# Patient Record
Sex: Female | Born: 1950
Health system: Southern US, Community
[De-identification: ages and names within clinical notes are randomized; demographics above are authoritative.]

## PROBLEM LIST (undated history)

## (undated) DIAGNOSIS — N951 Menopausal and female climacteric states: Secondary | ICD-10-CM

## (undated) DIAGNOSIS — F32A Depression, unspecified: Secondary | ICD-10-CM

## (undated) DIAGNOSIS — I4891 Unspecified atrial fibrillation: Secondary | ICD-10-CM

## (undated) DIAGNOSIS — M8580 Other specified disorders of bone density and structure, unspecified site: Secondary | ICD-10-CM

## (undated) DIAGNOSIS — J45909 Unspecified asthma, uncomplicated: Secondary | ICD-10-CM

## (undated) DIAGNOSIS — R519 Headache, unspecified: Secondary | ICD-10-CM

## (undated) DIAGNOSIS — F419 Anxiety disorder, unspecified: Secondary | ICD-10-CM

## (undated) DIAGNOSIS — J40 Bronchitis, not specified as acute or chronic: Secondary | ICD-10-CM

## (undated) DIAGNOSIS — R51 Headache: Secondary | ICD-10-CM

## (undated) DIAGNOSIS — R011 Cardiac murmur, unspecified: Secondary | ICD-10-CM

## (undated) DIAGNOSIS — R42 Dizziness and giddiness: Secondary | ICD-10-CM

## (undated) DIAGNOSIS — F329 Major depressive disorder, single episode, unspecified: Secondary | ICD-10-CM

## (undated) DIAGNOSIS — M1990 Unspecified osteoarthritis, unspecified site: Secondary | ICD-10-CM

## (undated) HISTORY — DX: Unspecified asthma, uncomplicated: J45.909

## (undated) HISTORY — DX: Depression, unspecified: F32.A

## (undated) HISTORY — PX: NASAL SINUS SURGERY: SHX719

## (undated) HISTORY — DX: Bronchitis, not specified as acute or chronic: J40

## (undated) HISTORY — DX: Other specified disorders of bone density and structure, unspecified site: M85.80

## (undated) HISTORY — PX: BREAST BIOPSY: SHX20

## (undated) HISTORY — DX: Menopausal and female climacteric states: N95.1

## (undated) HISTORY — DX: Anxiety disorder, unspecified: F41.9

## (undated) HISTORY — DX: Major depressive disorder, single episode, unspecified: F32.9

---

## 1999-06-17 ENCOUNTER — Other Ambulatory Visit: Admission: RE | Admit: 1999-06-17 | Discharge: 1999-06-17 | Payer: Self-pay | Admitting: Obstetrics and Gynecology

## 1999-09-10 ENCOUNTER — Encounter: Payer: Self-pay | Admitting: Internal Medicine

## 1999-09-10 ENCOUNTER — Encounter: Admission: RE | Admit: 1999-09-10 | Discharge: 1999-09-10 | Payer: Self-pay | Admitting: Internal Medicine

## 2000-06-18 ENCOUNTER — Other Ambulatory Visit: Admission: RE | Admit: 2000-06-18 | Discharge: 2000-06-18 | Payer: Self-pay | Admitting: Obstetrics and Gynecology

## 2000-09-16 ENCOUNTER — Encounter: Payer: Self-pay | Admitting: Internal Medicine

## 2000-09-16 ENCOUNTER — Encounter: Admission: RE | Admit: 2000-09-16 | Discharge: 2000-09-16 | Payer: Self-pay | Admitting: Internal Medicine

## 2000-09-21 ENCOUNTER — Encounter: Payer: Self-pay | Admitting: Internal Medicine

## 2000-09-21 ENCOUNTER — Encounter: Admission: RE | Admit: 2000-09-21 | Discharge: 2000-09-21 | Payer: Self-pay | Admitting: Internal Medicine

## 2001-06-29 ENCOUNTER — Other Ambulatory Visit: Admission: RE | Admit: 2001-06-29 | Discharge: 2001-06-29 | Payer: Self-pay | Admitting: Obstetrics and Gynecology

## 2001-08-03 ENCOUNTER — Ambulatory Visit (HOSPITAL_COMMUNITY): Admission: RE | Admit: 2001-08-03 | Discharge: 2001-08-03 | Payer: Self-pay | Admitting: Gastroenterology

## 2001-10-05 ENCOUNTER — Encounter: Admission: RE | Admit: 2001-10-05 | Discharge: 2001-10-05 | Payer: Self-pay | Admitting: Internal Medicine

## 2001-10-05 ENCOUNTER — Encounter: Payer: Self-pay | Admitting: Internal Medicine

## 2002-06-29 ENCOUNTER — Other Ambulatory Visit: Admission: RE | Admit: 2002-06-29 | Discharge: 2002-06-29 | Payer: Self-pay | Admitting: Obstetrics and Gynecology

## 2002-10-09 ENCOUNTER — Encounter: Admission: RE | Admit: 2002-10-09 | Discharge: 2002-10-09 | Payer: Self-pay | Admitting: Internal Medicine

## 2002-10-09 ENCOUNTER — Encounter: Payer: Self-pay | Admitting: Internal Medicine

## 2003-06-28 ENCOUNTER — Ambulatory Visit: Admission: RE | Admit: 2003-06-28 | Discharge: 2003-06-28 | Payer: Self-pay | Admitting: Obstetrics and Gynecology

## 2003-08-16 ENCOUNTER — Other Ambulatory Visit: Admission: RE | Admit: 2003-08-16 | Discharge: 2003-08-16 | Payer: Self-pay | Admitting: Obstetrics and Gynecology

## 2003-10-11 ENCOUNTER — Encounter: Admission: RE | Admit: 2003-10-11 | Discharge: 2003-10-11 | Payer: Self-pay | Admitting: Internal Medicine

## 2003-12-13 ENCOUNTER — Encounter: Admission: RE | Admit: 2003-12-13 | Discharge: 2003-12-13 | Payer: Self-pay | Admitting: Obstetrics and Gynecology

## 2004-05-25 HISTORY — PX: BREAST CYST ASPIRATION: SHX578

## 2004-08-19 ENCOUNTER — Other Ambulatory Visit: Admission: RE | Admit: 2004-08-19 | Discharge: 2004-08-19 | Payer: Self-pay | Admitting: Obstetrics and Gynecology

## 2004-08-21 ENCOUNTER — Encounter: Admission: RE | Admit: 2004-08-21 | Discharge: 2004-08-21 | Payer: Self-pay | Admitting: Obstetrics and Gynecology

## 2005-06-11 ENCOUNTER — Ambulatory Visit: Payer: Self-pay

## 2005-08-20 ENCOUNTER — Other Ambulatory Visit: Admission: RE | Admit: 2005-08-20 | Discharge: 2005-08-20 | Payer: Self-pay | Admitting: Obstetrics and Gynecology

## 2005-08-24 ENCOUNTER — Encounter: Admission: RE | Admit: 2005-08-24 | Discharge: 2005-08-24 | Payer: Self-pay | Admitting: Internal Medicine

## 2005-09-14 ENCOUNTER — Encounter: Admission: RE | Admit: 2005-09-14 | Discharge: 2005-09-14 | Payer: Self-pay | Admitting: Obstetrics and Gynecology

## 2006-05-25 HISTORY — PX: BREAST CYST ASPIRATION: SHX578

## 2006-08-25 ENCOUNTER — Other Ambulatory Visit: Admission: RE | Admit: 2006-08-25 | Discharge: 2006-08-25 | Payer: Self-pay | Admitting: Obstetrics and Gynecology

## 2006-08-30 ENCOUNTER — Encounter: Admission: RE | Admit: 2006-08-30 | Discharge: 2006-08-30 | Payer: Self-pay | Admitting: Obstetrics and Gynecology

## 2007-08-31 ENCOUNTER — Encounter: Admission: RE | Admit: 2007-08-31 | Discharge: 2007-08-31 | Payer: Self-pay | Admitting: Internal Medicine

## 2007-09-09 ENCOUNTER — Encounter: Admission: RE | Admit: 2007-09-09 | Discharge: 2007-09-09 | Payer: Self-pay | Admitting: Internal Medicine

## 2007-09-19 ENCOUNTER — Other Ambulatory Visit: Admission: RE | Admit: 2007-09-19 | Discharge: 2007-09-19 | Payer: Self-pay | Admitting: Obstetrics and Gynecology

## 2008-03-07 ENCOUNTER — Encounter: Admission: RE | Admit: 2008-03-07 | Discharge: 2008-03-07 | Payer: Self-pay | Admitting: Internal Medicine

## 2008-04-27 ENCOUNTER — Encounter: Admission: RE | Admit: 2008-04-27 | Discharge: 2008-04-27 | Payer: Self-pay | Admitting: Internal Medicine

## 2008-05-04 ENCOUNTER — Ambulatory Visit: Payer: Self-pay | Admitting: Internal Medicine

## 2008-05-04 DIAGNOSIS — J82 Pulmonary eosinophilia, not elsewhere classified: Secondary | ICD-10-CM

## 2008-05-04 DIAGNOSIS — J8289 Other pulmonary eosinophilia, not elsewhere classified: Secondary | ICD-10-CM | POA: Insufficient documentation

## 2008-05-08 ENCOUNTER — Ambulatory Visit: Payer: Self-pay | Admitting: Internal Medicine

## 2008-08-31 ENCOUNTER — Encounter: Admission: RE | Admit: 2008-08-31 | Discharge: 2008-08-31 | Payer: Self-pay | Admitting: Internal Medicine

## 2008-10-04 ENCOUNTER — Encounter: Admission: RE | Admit: 2008-10-04 | Discharge: 2008-10-04 | Payer: Self-pay | Admitting: Internal Medicine

## 2008-10-10 ENCOUNTER — Encounter: Payer: Self-pay | Admitting: Obstetrics and Gynecology

## 2008-10-10 ENCOUNTER — Ambulatory Visit: Payer: Self-pay | Admitting: Obstetrics and Gynecology

## 2008-10-10 ENCOUNTER — Other Ambulatory Visit: Admission: RE | Admit: 2008-10-10 | Discharge: 2008-10-10 | Payer: Self-pay | Admitting: Obstetrics and Gynecology

## 2008-10-12 ENCOUNTER — Ambulatory Visit: Payer: Self-pay | Admitting: Obstetrics and Gynecology

## 2008-10-23 ENCOUNTER — Ambulatory Visit: Payer: Self-pay | Admitting: Obstetrics and Gynecology

## 2009-02-22 ENCOUNTER — Encounter: Admission: RE | Admit: 2009-02-22 | Discharge: 2009-02-22 | Payer: Self-pay | Admitting: Internal Medicine

## 2009-09-02 ENCOUNTER — Encounter: Admission: RE | Admit: 2009-09-02 | Discharge: 2009-09-02 | Payer: Self-pay | Admitting: Internal Medicine

## 2009-10-16 ENCOUNTER — Other Ambulatory Visit: Admission: RE | Admit: 2009-10-16 | Discharge: 2009-10-16 | Payer: Self-pay | Admitting: Obstetrics and Gynecology

## 2009-10-16 ENCOUNTER — Ambulatory Visit: Payer: Self-pay | Admitting: Obstetrics and Gynecology

## 2009-10-28 ENCOUNTER — Encounter: Admission: RE | Admit: 2009-10-28 | Discharge: 2009-10-28 | Payer: Self-pay | Admitting: Internal Medicine

## 2009-12-11 ENCOUNTER — Ambulatory Visit: Payer: Self-pay | Admitting: Obstetrics and Gynecology

## 2010-06-15 ENCOUNTER — Encounter: Payer: Self-pay | Admitting: Internal Medicine

## 2010-06-15 ENCOUNTER — Encounter: Payer: Self-pay | Admitting: Obstetrics and Gynecology

## 2010-07-30 ENCOUNTER — Other Ambulatory Visit: Payer: Self-pay | Admitting: Internal Medicine

## 2010-07-30 DIAGNOSIS — Z1231 Encounter for screening mammogram for malignant neoplasm of breast: Secondary | ICD-10-CM

## 2010-09-04 ENCOUNTER — Ambulatory Visit
Admission: RE | Admit: 2010-09-04 | Discharge: 2010-09-04 | Disposition: A | Discharge status: Home / Self Care | Payer: BC Managed Care – PPO | Source: Ambulatory Visit | Attending: Internal Medicine | Admitting: Internal Medicine

## 2010-09-04 DIAGNOSIS — Z1231 Encounter for screening mammogram for malignant neoplasm of breast: Secondary | ICD-10-CM

## 2010-10-10 NOTE — Procedures (Signed)
[facility]  Patient:    Leah Reeves, Leah Reeves Visit Number: [id number] MRN: [id number]          Service Type: Attending:  Verlin Grills, M.D. Dictated by:   Verlin Grills, M.D. Proc. Date: 08/03/01   CC:         Winn Jock. Earl Gala, M.D.   Procedure Report  PROCEDURE:  Screening colonoscopy.  REFERRING PHYSICIAN:  Winn Jock. Earl Gala, M.D.  PROCEDURE INDICATION:  Leah Reeves is a 60 year old female born May 05, 1951.  She is due for her first screening colonoscopy with polypectomy to prevent colon cancer.  I discussed with Leah Reeves the complications associated with colonoscopy and polypectomy, including a 15 per 1000 risk of bleeding and 4 per 1000 risk of colon perforation requiring surgical repair.  Leah Reeves has signed the operative permit.  ENDOSCOPIST:  Verlin Grills, M.D.  PREMEDICATION:  Versed 10 mg, Demerol 100 mg.  ENDOSCOPE:  Olympus pediatric colonoscope.  DESCRIPTION OF PROCEDURE:  After obtaining informed consent, Leah Reeves was placed in the left lateral decubitus position.  I administered intravenous Demerol and intravenous Versed to achieve conscious sedation for the procedure.  The patients blood pressure, oxygen saturation, and cardiac rhythm were monitored throughout the procedure and documented in the medical record.  Anal inspection was normal.  Digital rectal exam was normal.  The Olympus pediatric video colonoscope was introduced into the rectum and advanced to the cecum with the patient in the left lateral decubitus position.  Colonic preparation for the exam today was excellent.  RECTUM:  Normal.  SIGMOID COLON AND DESCENDING COLON:  Normal.  SPLENIC FLEXURE:  Normal.  TRANSVERSE COLON:  Normal.  HEPATIC FLEXURE:  Normal.  ASCENDING COLON:  Normal.  CECUM AND ILEOCECAL VALVE:  Normal.  ASSESSMENT:  Normal proctocolonoscopy to the cecum.  No endoscopic evidence for the presence  of colorectal neoplasia.  RECOMMENDATIONS:  Repeat colonoscopy in 10 years. Dictated by:   Verlin Grills, M.D. Attending:  Verlin Grills, M.D. DD:  08/03/01 TD:  08/03/01 Job: [id number]

## 2010-11-06 ENCOUNTER — Other Ambulatory Visit: Payer: Self-pay | Admitting: Obstetrics and Gynecology

## 2010-11-06 ENCOUNTER — Encounter (INDEPENDENT_AMBULATORY_CARE_PROVIDER_SITE_OTHER): Payer: BC Managed Care – PPO | Admitting: Obstetrics and Gynecology

## 2010-11-06 ENCOUNTER — Other Ambulatory Visit (HOSPITAL_COMMUNITY)
Admission: RE | Admit: 2010-11-06 | Discharge: 2010-11-06 | Disposition: A | Discharge status: Home / Self Care | Payer: BC Managed Care – PPO | Source: Ambulatory Visit | Attending: Obstetrics and Gynecology | Admitting: Obstetrics and Gynecology

## 2010-11-06 DIAGNOSIS — Z124 Encounter for screening for malignant neoplasm of cervix: Secondary | ICD-10-CM | POA: Insufficient documentation

## 2010-11-06 DIAGNOSIS — Z01419 Encounter for gynecological examination (general) (routine) without abnormal findings: Secondary | ICD-10-CM

## 2010-11-20 ENCOUNTER — Encounter (INDEPENDENT_AMBULATORY_CARE_PROVIDER_SITE_OTHER): Payer: BC Managed Care – PPO

## 2010-11-20 DIAGNOSIS — M899 Disorder of bone, unspecified: Secondary | ICD-10-CM

## 2010-12-03 ENCOUNTER — Ambulatory Visit (INDEPENDENT_AMBULATORY_CARE_PROVIDER_SITE_OTHER): Payer: BC Managed Care – PPO | Admitting: Obstetrics and Gynecology

## 2010-12-03 DIAGNOSIS — M899 Disorder of bone, unspecified: Secondary | ICD-10-CM

## 2010-12-03 DIAGNOSIS — M949 Disorder of cartilage, unspecified: Secondary | ICD-10-CM

## 2010-12-26 ENCOUNTER — Other Ambulatory Visit: Payer: Self-pay | Admitting: *Deleted

## 2010-12-26 MED ORDER — MEDROXYPROGESTERONE ACETATE 2.5 MG PO TABS: 2.5000 mg | ORAL_TABLET | Freq: Every day | ORAL | Status: DC

## 2010-12-29 ENCOUNTER — Other Ambulatory Visit: Payer: Self-pay | Admitting: *Deleted

## 2011-01-01 DIAGNOSIS — Z1211 Encounter for screening for malignant neoplasm of colon: Secondary | ICD-10-CM

## 2011-01-05 ENCOUNTER — Other Ambulatory Visit: Payer: Self-pay | Admitting: *Deleted

## 2011-01-05 DIAGNOSIS — Z1211 Encounter for screening for malignant neoplasm of colon: Secondary | ICD-10-CM

## 2011-02-02 ENCOUNTER — Other Ambulatory Visit: Payer: Self-pay

## 2011-02-02 MED ORDER — ESTRADIOL 0.0375 MG/24HR TD PTTW: 1.0000 | MEDICATED_PATCH | TRANSDERMAL | Status: DC

## 2011-02-02 NOTE — Telephone Encounter (Signed)
Vivelle I think is only brand. However medroxyprogesterone is the generic.

## 2011-02-02 NOTE — Telephone Encounter (Signed)
SEE PREVIOUS FAX ATTACHED TO PTS. CHART. BOTH LINES SIGNED ON RX. AM I CORRECT THAT THIS RX STILL ONLY COMES IN BRAND?

## 2011-02-02 NOTE — Telephone Encounter (Signed)
RESENT CORRECT RX TO [facility].

## 2011-08-21 ENCOUNTER — Other Ambulatory Visit: Payer: Self-pay | Admitting: Internal Medicine

## 2011-08-21 DIAGNOSIS — Z1231 Encounter for screening mammogram for malignant neoplasm of breast: Secondary | ICD-10-CM

## 2011-09-07 ENCOUNTER — Ambulatory Visit
Admission: RE | Admit: 2011-09-07 | Discharge: 2011-09-07 | Disposition: A | Discharge status: Home / Self Care | Payer: BC Managed Care – PPO | Source: Ambulatory Visit | Attending: Internal Medicine | Admitting: Internal Medicine

## 2011-09-07 DIAGNOSIS — Z1231 Encounter for screening mammogram for malignant neoplasm of breast: Secondary | ICD-10-CM

## 2011-11-09 ENCOUNTER — Encounter: Payer: Self-pay | Admitting: Obstetrics and Gynecology

## 2011-11-09 ENCOUNTER — Ambulatory Visit (INDEPENDENT_AMBULATORY_CARE_PROVIDER_SITE_OTHER): Payer: BC Managed Care – PPO | Admitting: Obstetrics and Gynecology

## 2011-11-09 VITALS — BP 126/70 | Ht 64.0 in | Wt 173.5 lb

## 2011-11-09 DIAGNOSIS — Z01419 Encounter for gynecological examination (general) (routine) without abnormal findings: Secondary | ICD-10-CM

## 2011-11-09 LAB — CBC WITH DIFFERENTIAL/PLATELET
Basophils Absolute: 0.1 10*3/uL (ref 0.0–0.1)
Basophils Relative: 1 % (ref 0–1)
Eosinophils Relative: 4 % (ref 0–5)
HCT: 40.4 % (ref 36.0–46.0)
MCHC: 33.4 g/dL (ref 30.0–36.0)
MCV: 91.6 fL (ref 78.0–100.0)
Monocytes Absolute: 0.7 10*3/uL (ref 0.1–1.0)
Platelets: 347 10*3/uL (ref 150–400)
RDW: 13 % (ref 11.5–15.5)

## 2011-11-09 LAB — CHOLESTEROL, TOTAL: Cholesterol: 170 mg/dL (ref 0–200)

## 2011-11-09 MED ORDER — MEDROXYPROGESTERONE ACETATE 2.5 MG PO TABS: 2.5000 mg | ORAL_TABLET | Freq: Every day | ORAL | Status: DC

## 2011-11-09 MED ORDER — ESTRADIOL 0.0375 MG/24HR TD PTTW: 1.0000 | MEDICATED_PATCH | TRANSDERMAL | Status: DC

## 2011-11-09 NOTE — Progress Notes (Signed)
Patient came to see me today for her annual GYN exam. She continues to do well on HRT and does not want to stop it. She is having no vaginal bleeding. She is having no pelvic pain. She is well aware of the slight higher risk of breast cancer with prolonged HRT. She has osteopenia without an elevated fracture risk. She has had no fractures. She takes calcium and vitamin D. She is up-to-date on mammograms. She has not had cervical dysplasia. Her last Pap smear was June of 2012.  Physical examination: Beola Cord present. HEENT within normal limits. Neck: Thyroid not large. No masses. Supraclavicular nodes: not enlarged. Breasts: Examined in both sitting and lying  position. No skin changes and no masses. Abdomen: Soft no guarding rebound or masses or hernia. Pelvic: External: Within normal limits. BUS: Within normal limits. Vaginal:within normal limits. Good estrogen effect. No evidence of cystocele rectocele or enterocele. Cervix: clean. Uterus: Normal size and shape. Adnexa: No masses. Rectovaginal exam: Confirmatory and negative. Extremities: Within normal limits.  Assessment: #1. Menopausal symptoms #2. Osteopenia  Plan: Continue HRT. Once again discussed benefits and risks and she feels benefits outweigh the risks. Continue periodic bone densities. Continue yearly mammograms.

## 2011-11-10 LAB — URINALYSIS W MICROSCOPIC + REFLEX CULTURE
Hgb urine dipstick: NEGATIVE
Ketones, ur: NEGATIVE mg/dL
Nitrite: NEGATIVE
pH: 6 (ref 5.0–8.0)

## 2012-08-31 ENCOUNTER — Other Ambulatory Visit: Payer: Self-pay

## 2012-08-31 DIAGNOSIS — Z1231 Encounter for screening mammogram for malignant neoplasm of breast: Secondary | ICD-10-CM

## 2012-10-06 ENCOUNTER — Ambulatory Visit
Admission: RE | Admit: 2012-10-06 | Discharge: 2012-10-06 | Disposition: A | Discharge status: Home / Self Care | Payer: BC Managed Care – PPO | Source: Ambulatory Visit

## 2012-10-06 DIAGNOSIS — Z1231 Encounter for screening mammogram for malignant neoplasm of breast: Secondary | ICD-10-CM

## 2013-01-11 ENCOUNTER — Other Ambulatory Visit: Payer: Self-pay

## 2013-01-11 MED ORDER — MEDROXYPROGESTERONE ACETATE 2.5 MG PO TABS: 2.5000 mg | ORAL_TABLET | Freq: Every day | ORAL | Status: DC

## 2013-09-13 ENCOUNTER — Other Ambulatory Visit: Payer: Self-pay

## 2013-09-13 DIAGNOSIS — Z1231 Encounter for screening mammogram for malignant neoplasm of breast: Secondary | ICD-10-CM

## 2013-10-09 ENCOUNTER — Ambulatory Visit: Payer: BC Managed Care – PPO

## 2013-10-23 ENCOUNTER — Other Ambulatory Visit: Payer: Self-pay | Admitting: Internal Medicine

## 2013-10-23 ENCOUNTER — Ambulatory Visit
Admission: RE | Admit: 2013-10-23 | Discharge: 2013-10-23 | Disposition: A | Discharge status: Home / Self Care | Payer: BC Managed Care – PPO | Source: Ambulatory Visit

## 2013-10-23 ENCOUNTER — Encounter (INDEPENDENT_AMBULATORY_CARE_PROVIDER_SITE_OTHER): Payer: Self-pay

## 2013-10-23 DIAGNOSIS — Z1231 Encounter for screening mammogram for malignant neoplasm of breast: Secondary | ICD-10-CM

## 2013-10-23 DIAGNOSIS — R209 Unspecified disturbances of skin sensation: Secondary | ICD-10-CM

## 2013-10-24 ENCOUNTER — Other Ambulatory Visit: Payer: BC Managed Care – PPO

## 2013-10-24 ENCOUNTER — Ambulatory Visit
Admission: RE | Admit: 2013-10-24 | Discharge: 2013-10-24 | Disposition: A | Discharge status: Home / Self Care | Payer: BC Managed Care – PPO | Source: Ambulatory Visit | Attending: Internal Medicine | Admitting: Internal Medicine

## 2013-10-24 DIAGNOSIS — R209 Unspecified disturbances of skin sensation: Secondary | ICD-10-CM

## 2013-10-24 MED ORDER — GADOBENATE DIMEGLUMINE 529 MG/ML IV SOLN
16.0000 mL | Freq: Once | INTRAVENOUS | Status: AC | PRN
  Administered 2013-10-24: 16 mL via INTRAVENOUS

## 2014-04-24 ENCOUNTER — Other Ambulatory Visit: Payer: Self-pay | Admitting: Internal Medicine

## 2014-04-24 DIAGNOSIS — D329 Benign neoplasm of meninges, unspecified: Secondary | ICD-10-CM

## 2014-05-03 ENCOUNTER — Ambulatory Visit
Admission: RE | Admit: 2014-05-03 | Discharge: 2014-05-03 | Disposition: A | Discharge status: Home / Self Care | Payer: BC Managed Care – PPO | Source: Ambulatory Visit | Attending: Internal Medicine | Admitting: Internal Medicine

## 2014-05-03 DIAGNOSIS — D329 Benign neoplasm of meninges, unspecified: Secondary | ICD-10-CM

## 2014-05-03 MED ORDER — GADOBENATE DIMEGLUMINE 529 MG/ML IV SOLN
17.0000 mL | Freq: Once | INTRAVENOUS | Status: AC | PRN
Start: 2014-05-03 — End: 2014-05-03
  Administered 2014-05-03: 17 mL via INTRAVENOUS

## 2014-06-22 ENCOUNTER — Other Ambulatory Visit: Payer: Self-pay | Admitting: Gastroenterology

## 2014-09-04 ENCOUNTER — Encounter (HOSPITAL_COMMUNITY): Payer: Self-pay | Admitting: *Deleted

## 2014-09-18 ENCOUNTER — Encounter (HOSPITAL_COMMUNITY): Payer: Self-pay | Admitting: Gastroenterology

## 2014-09-18 ENCOUNTER — Ambulatory Visit (HOSPITAL_COMMUNITY): Payer: BLUE CROSS/BLUE SHIELD | Admitting: Anesthesiology

## 2014-09-18 ENCOUNTER — Ambulatory Visit (HOSPITAL_COMMUNITY)
Admission: RE | Admit: 2014-09-18 | Discharge: 2014-09-18 | Disposition: A | Discharge status: Home / Self Care | Payer: BLUE CROSS/BLUE SHIELD | Source: Ambulatory Visit | Attending: Gastroenterology | Admitting: Gastroenterology

## 2014-09-18 ENCOUNTER — Encounter (HOSPITAL_COMMUNITY)
Admission: RE | Disposition: A | Discharge status: Home / Self Care | Payer: Self-pay | Source: Ambulatory Visit | Attending: Gastroenterology

## 2014-09-18 DIAGNOSIS — F419 Anxiety disorder, unspecified: Secondary | ICD-10-CM | POA: Insufficient documentation

## 2014-09-18 DIAGNOSIS — M19042 Primary osteoarthritis, left hand: Secondary | ICD-10-CM | POA: Diagnosis not present

## 2014-09-18 DIAGNOSIS — F329 Major depressive disorder, single episode, unspecified: Secondary | ICD-10-CM | POA: Insufficient documentation

## 2014-09-18 DIAGNOSIS — E780 Pure hypercholesterolemia: Secondary | ICD-10-CM | POA: Insufficient documentation

## 2014-09-18 DIAGNOSIS — M19041 Primary osteoarthritis, right hand: Secondary | ICD-10-CM | POA: Insufficient documentation

## 2014-09-18 DIAGNOSIS — Z87891 Personal history of nicotine dependence: Secondary | ICD-10-CM | POA: Insufficient documentation

## 2014-09-18 DIAGNOSIS — Z1211 Encounter for screening for malignant neoplasm of colon: Secondary | ICD-10-CM | POA: Diagnosis present

## 2014-09-18 DIAGNOSIS — R51 Headache: Secondary | ICD-10-CM | POA: Diagnosis not present

## 2014-09-18 HISTORY — DX: Cardiac murmur, unspecified: R01.1

## 2014-09-18 HISTORY — DX: Headache: R51

## 2014-09-18 HISTORY — PX: COLONOSCOPY WITH PROPOFOL: SHX5780

## 2014-09-18 HISTORY — DX: Headache, unspecified: R51.9

## 2014-09-18 HISTORY — DX: Dizziness and giddiness: R42

## 2014-09-18 HISTORY — DX: Unspecified osteoarthritis, unspecified site: M19.90

## 2014-09-18 SURGERY — COLONOSCOPY WITH PROPOFOL
Anesthesia: Monitor Anesthesia Care

## 2014-09-18 MED ORDER — LACTATED RINGERS IV SOLN
INTRAVENOUS | Status: DC
  Administered 2014-09-18: 1000 mL via INTRAVENOUS

## 2014-09-18 MED ORDER — SODIUM CHLORIDE 0.9 % IV SOLN: INTRAVENOUS | Status: DC

## 2014-09-18 MED ORDER — PROPOFOL 10 MG/ML IV BOLUS
INTRAVENOUS | Status: AC
  Filled 2014-09-18: qty 20

## 2014-09-18 MED ORDER — PROPOFOL 10 MG/ML IV BOLUS
INTRAVENOUS | Status: DC | PRN
  Administered 2014-09-18 (×3): 20 mg via INTRAVENOUS
  Administered 2014-09-18 (×4): 10 mg via INTRAVENOUS
  Administered 2014-09-18: 50 mg via INTRAVENOUS
  Administered 2014-09-18 (×5): 10 mg via INTRAVENOUS

## 2014-09-18 SURGICAL SUPPLY — 22 items

## 2014-09-18 NOTE — H&P (Signed)
  Procedure: Screening colonoscopy. Normal screening colonoscopy performed on 08/03/2001  History: The patient is a 64 year old female born [birth year]. She is scheduled to undergo a repeat screening colonoscopy today.  Past medical history: Hypercholesterolemia. Depression. Osteoarthritis of the hands. Meningioma. Pituitary cyst.  Allergies: Mycin antibiotics causes itching  Exam: The patient is alert and lying comfortably on the endoscopy stretcher. Abdomen is soft and nontender to palpation. Lungs are clear to auscultation. Cardiac exam reveals a regular rhythm.  Plan: Proceed with screening colonoscopy

## 2014-09-18 NOTE — Anesthesia Preprocedure Evaluation (Signed)
Anesthesia Evaluation  Patient identified by MRN, date of birth, ID band Patient awake    Reviewed: Allergy & Precautions, NPO status , Patient's Chart, lab work & pertinent test results  Airway Mallampati: II   Neck ROM: full    Dental   Pulmonary former smoker,  breath sounds clear to auscultation        Cardiovascular negative cardio ROS  Rhythm:regular Rate:Normal     Neuro/Psych  Headaches, Anxiety Depression    GI/Hepatic   Endo/Other    Renal/GU      Musculoskeletal  (+) Arthritis -,   Abdominal   Peds  Hematology   Anesthesia Other Findings   Reproductive/Obstetrics                             Anesthesia Physical Anesthesia Plan  ASA: II  Anesthesia Plan: MAC   Post-op Pain Management:    Induction: Intravenous  Airway Management Planned: Simple Face Mask  Additional Equipment:   Intra-op Plan:   Post-operative Plan:   Informed Consent: I have reviewed the patients History and Physical, chart, labs and discussed the procedure including the risks, benefits and alternatives for the proposed anesthesia with the patient or authorized representative who has indicated his/her understanding and acceptance.     Plan Discussed with: CRNA, Anesthesiologist and Surgeon  Anesthesia Plan Comments:         Anesthesia Quick Evaluation

## 2014-09-18 NOTE — Discharge Instructions (Signed)

## 2014-09-18 NOTE — Anesthesia Postprocedure Evaluation (Signed)
Anesthesia Post Note  Patient: Leah Reeves  Procedure(s) Performed: Procedure(s) (LRB): COLONOSCOPY WITH PROPOFOL (N/A)  Anesthesia type: MAC  Patient location: PACU  Post pain: Pain level controlled and Adequate analgesia  Post assessment: Post-op Vital signs reviewed, Patient's Cardiovascular Status Stable and Respiratory Function Stable  Last Vitals:  Filed Vitals:   09/18/14 0845  BP:   Pulse: 60  Temp:   Resp: 15    Post vital signs: Reviewed and stable  Level of consciousness: awake, alert  and oriented  Complications: No apparent anesthesia complications

## 2014-09-18 NOTE — Op Note (Signed)
Procedure: Screening colonoscopy. Normal screening colonoscopy performed on 08/03/2001  Endoscopist: Earle Gell  Premedication: Propofol administered by anesthesia  Procedure: The patient was placed in the left lateral decubitus position. Anal inspection and digital rectal exam were normal. The Pentax pediatric colonoscope was introduced into the rectum and advanced to the cecum. A normal-appearing appendiceal orifice was identified. A normal-appearing ileocecal valve was identified. Colonic preparation for the exam today was good. Withdrawal time was 10 minutes  Rectum. Normal. Retroflexed view of the distal rectum normal  Sigmoid colon and descending colon. Normal  Splenic flexure. Normal  Transverse colon. Normal  Hepatic flexure. Normal  Ascending colon. Normal  Cecum and ileocecal valve. Normal  Assessment: Normal screening colonoscopy  Recommendation: Schedule repeat screening colonoscopy in 10 years

## 2014-09-18 NOTE — Transfer of Care (Signed)
Immediate Anesthesia Transfer of Care Note  Patient: Leah Reeves  Procedure(s) Performed: Procedure(s): COLONOSCOPY WITH PROPOFOL (N/A)  Patient Location: PACU  Anesthesia Type:MAC  Level of Consciousness: sedated  Airway & Oxygen Therapy: Patient Spontanous Breathing and Patient connected to nasal cannula oxygen  Post-op Assessment: Report given to RN and Post -op Vital signs reviewed and stable  Post vital signs: Reviewed and stable  Last Vitals:  Filed Vitals:   09/18/14 0653  BP: 143/56  Pulse: 56  Temp: 36.8 C  Resp: 14    Complications: No apparent anesthesia complications

## 2014-09-19 ENCOUNTER — Encounter (HOSPITAL_COMMUNITY): Payer: Self-pay | Admitting: Gastroenterology

## 2014-10-09 ENCOUNTER — Other Ambulatory Visit: Payer: Self-pay

## 2014-10-09 DIAGNOSIS — Z1231 Encounter for screening mammogram for malignant neoplasm of breast: Secondary | ICD-10-CM

## 2014-11-01 ENCOUNTER — Ambulatory Visit
Admission: RE | Admit: 2014-11-01 | Discharge: 2014-11-01 | Disposition: A | Discharge status: Home / Self Care | Payer: BLUE CROSS/BLUE SHIELD | Source: Ambulatory Visit

## 2014-11-01 DIAGNOSIS — Z1231 Encounter for screening mammogram for malignant neoplasm of breast: Secondary | ICD-10-CM

## 2015-09-27 ENCOUNTER — Other Ambulatory Visit: Payer: Self-pay

## 2015-09-27 DIAGNOSIS — Z1231 Encounter for screening mammogram for malignant neoplasm of breast: Secondary | ICD-10-CM

## 2015-11-04 ENCOUNTER — Ambulatory Visit
Admission: RE | Admit: 2015-11-04 | Discharge: 2015-11-04 | Disposition: A | Discharge status: Home / Self Care | Payer: BLUE CROSS/BLUE SHIELD | Source: Ambulatory Visit

## 2015-11-04 DIAGNOSIS — Z1231 Encounter for screening mammogram for malignant neoplasm of breast: Secondary | ICD-10-CM

## 2016-08-03 ENCOUNTER — Encounter: Payer: Self-pay | Admitting: Neurology

## 2016-08-03 ENCOUNTER — Ambulatory Visit (INDEPENDENT_AMBULATORY_CARE_PROVIDER_SITE_OTHER): Payer: BLUE CROSS/BLUE SHIELD | Admitting: Neurology

## 2016-08-03 VITALS — BP 122/64 | HR 65 | Ht 62.5 in | Wt 155.7 lb

## 2016-08-03 DIAGNOSIS — G500 Trigeminal neuralgia: Secondary | ICD-10-CM

## 2016-08-03 DIAGNOSIS — H8110 Benign paroxysmal vertigo, unspecified ear: Secondary | ICD-10-CM

## 2016-08-03 DIAGNOSIS — D320 Benign neoplasm of cerebral meninges: Secondary | ICD-10-CM | POA: Diagnosis not present

## 2016-08-03 NOTE — Progress Notes (Signed)
Office note routed to Dr Leeroy Cha.

## 2016-08-03 NOTE — Patient Instructions (Signed)
1.  We will repeat MRI of brain with and without contrast to evaluate the meningioma  2.  We will refer you to physical therapy for vestibular rehabilitation to treat the vertigo 3.  If jaw pain gets worse, make a follow up appointment

## 2016-08-03 NOTE — Progress Notes (Signed)
NEUROLOGY CONSULTATION NOTE  Leah Reeves MRN: [id number] DOB: Oct 06, 1950  Referring provider: Dr. Fara Olden Primary care provider: Dr. Fara Olden  Reason for consult:  meningioma  HISTORY OF PRESENT ILLNESS: Leah Reeves is a 66 year old right-handed female with depression and hypercholesterolemia who presents for vertigo and meningioma.  She is accompanied by her husband who supplements history.  History also supplemented by PCP note.  In 2015, she was experiencing right arm numbness.  She had an MRI of the brain with and without contrast on 10/24/13 to evaluate for numbness and tingling, which was personally reviewed.  It revealed a small 1.1 x 0.9 x 1.6 cm anterior left frontal meningioma without surrounding vasogenic edema or mass effect.  It also revealed minimal nonspecific white matter changes and small 3.5-4 mm cyst within the right lobe of the pituitary gland.  A follow-up MRI was performed on 05/03/14, which was stable.   For a year, she has had brief episodes of vertigo, described as spinning sensation.  They are positional, occurring when sitting up or laying down in bed too fast, or quick head turns.  They last only a few seconds.  Staying still helps.  There is no associated double vision or nausea.  They occur daily but are manageable.  For the past 6 months, she reports left sided jaw soreness.  Occasionally there is associated paroxysmal shooting pain in the left side of the jaw.  It occurs once every 2 weeks, lasting 1 to 2 hours.  There is some associated tingling.  She denies visual changes, neck pain, headache or tooth pain.  It is not triggered or exacerbated by talking, eating or brushing teeth.  It is not relieved by anything in particular.   PAST MEDICAL HISTORY: Past Medical History:  Diagnosis Date  . Anxiety   . Arthritis    knees -generalized  . Depression   . Headache    past history - none at present  . Heart murmur   . Osteopenia   .  Perimenopausal vasomotor symptoms   . Vertigo    tx. 6 months ago- no problems now,very mild occ.    PAST SURGICAL HISTORY: Past Surgical History:  Procedure Laterality Date  . COLONOSCOPY WITH PROPOFOL N/A 09/18/2014   Procedure: COLONOSCOPY WITH PROPOFOL;  Surgeon: Garlan Fair, MD;  Location: [facility];  Service: Endoscopy;  Laterality: N/A;    MEDICATIONS: Current Outpatient Prescriptions on File Prior to Visit  Medication Sig Dispense Refill  . ALPRAZolam (XANAX) 0.25 MG tablet Take 0.25 mg by mouth 2 (two) times daily as needed for anxiety.    Marland Kitchen buPROPion (WELLBUTRIN XL) 150 MG 24 hr tablet Take 150 mg by mouth every morning.    Marland Kitchen estradiol (VIVELLE-DOT) 0.0375 MG/24HR Place 1 patch onto the skin 2 (two) times a week. 24 patch 3  . FLUoxetine (PROZAC) 20 MG tablet Take 40 mg by mouth every morning.     . progesterone (PROMETRIUM) 100 MG capsule Take 100 mg by mouth every evening.    . simvastatin (ZOCOR) 40 MG tablet Take 40 mg by mouth every morning.    . loteprednol (LOTEMAX) 0.2 % SUSP Place 1 drop into both eyes daily as needed (Allergies).    . medroxyPROGESTERone (PROVERA) 2.5 MG tablet Take 1 tablet (2.5 mg total) by mouth daily. (Patient not taking: Reported on 08/03/2016) 30 tablet 0  . naproxen sodium (ANAPROX) 220 MG tablet Take 220 mg by mouth 2 (two) times daily as needed (Pain).  No current facility-administered medications on file prior to visit.     ALLERGIES: Allergies  Allergen Reactions  . Codeine Nausea And Vomiting  . Azithromycin Rash    FAMILY HISTORY: Family History  Problem Relation Age of Onset  . Diabetes Mother   . Hypertension Mother   . Heart disease Father   . Diabetes Sister   . Heart disease Sister     SOCIAL HISTORY: Social History   Social History  . Marital status: Married    Spouse name: N/A  . Number of children: N/A  . Years of education: N/A   Occupational History  . Not on file.   Social History Main  Topics  . Smoking status: Former Smoker    Types: Cigarettes    Quit date: 05/26/1991  . Smokeless tobacco: Never Used  . Alcohol use Yes     Comment: wine occassionally  . Drug use: No  . Sexual activity: Yes    Birth control/ protection: Post-menopausal   Other Topics Concern  . Not on file   Social History Narrative  . No narrative on file    REVIEW OF SYSTEMS: Constitutional: No fevers, chills, or sweats, no generalized fatigue, change in appetite Eyes: No visual changes, double vision, eye pain Ear, nose and throat: No hearing loss, ear pain, nasal congestion, sore throat Cardiovascular: No chest pain, palpitations Respiratory:  No shortness of breath at rest or with exertion, wheezes GastrointestinaI: No nausea, vomiting, diarrhea, abdominal pain, fecal incontinence Genitourinary:  No dysuria, urinary retention or frequency Musculoskeletal:  No neck pain, back pain Integumentary: No rash, pruritus, skin lesions Neurological: as above Psychiatric: No depression, insomnia, anxiety Endocrine: No palpitations, fatigue, diaphoresis, mood swings, change in appetite, change in weight, increased thirst Hematologic/Lymphatic:  No purpura, petechiae. Allergic/Immunologic: no itchy/runny eyes, nasal congestion, recent allergic reactions, rashes  PHYSICAL EXAM: Vitals:   08/03/16 0900  BP: 122/64  Pulse: 65   General: No acute distress.  Patient appears well-groomed.  Head:  Normocephalic/atraumatic Eyes:  fundi examined but not visualized Neck: supple, no paraspinal tenderness, full range of motion Back: No paraspinal tenderness Heart: regular rate and rhythm Lungs: Clear to auscultation bilaterally. Vascular: No carotid bruits. Neurological Exam: Mental status: alert and oriented to person, place, and time, recent and remote memory intact, fund of knowledge intact, attention and concentration intact, speech fluent and not dysarthric, language intact. Cranial nerves: CN I:  not tested CN II: pupils equal, round and reactive to light, visual fields intact CN III, IV, VI:  full range of motion, no nystagmus, no ptosis CN V: facial sensation intact CN VII: upper and lower face symmetric CN VIII: hearing intact CN IX, X: gag intact, uvula midline CN XI: sternocleidomastoid and trapezius muscles intact CN XII: tongue midline Bulk & Tone: normal, no fasciculations. Motor:  5/5 throughout  Sensation: temperature and vibration sensation intact. Deep Tendon Reflexes:  2+ throughout, toes downgoing.  Finger to nose testing:  Without dysmetria.  Heel to shin:  Without dysmetria.  Gait:  Normal station and stride.  Able to turn and tandem walk. Romberg negative.  IMPRESSION: Cerebral meningioma Benign paroxysmal positional vertigo Possible left sided trigeminal neuralgia  PLAN: 1.  Will repeat MRI of brain with and without contrast to evaluate change in meningioma (as well as trigeminal neuralgia).  If unremarkable, I would recommend PCP repeating MRI in 5 years and then every 10 years after that (if still stable). 2.  Refer for vestibular rehabilitation for vertigo 3.  If  jaw/facial pain increases, follow up with me.  Otherwise, as needed.  Thank you for allowing me to take part in the care of this patient.  Metta Clines, DO  CC:  Leeroy Cha, MD

## 2016-08-06 ENCOUNTER — Telehealth: Payer: Self-pay

## 2016-08-06 DIAGNOSIS — D320 Benign neoplasm of cerebral meninges: Secondary | ICD-10-CM

## 2016-08-06 DIAGNOSIS — G500 Trigeminal neuralgia: Secondary | ICD-10-CM

## 2016-08-06 DIAGNOSIS — H8110 Benign paroxysmal vertigo, unspecified ear: Secondary | ICD-10-CM

## 2016-08-06 NOTE — Telephone Encounter (Signed)
Judson Roch from [facility] call to inform MR MRA Head w/ and w/o contrast order would need to be changed b/c their facility only does w/o contrast. Changed order.

## 2016-08-12 ENCOUNTER — Telehealth: Payer: Self-pay | Admitting: Neurology

## 2016-08-12 NOTE — Telephone Encounter (Signed)
Spoke to patient. Advised showing referral was not sent to dept. Fwd to [facility]. Provided pt w/ phone number of facility to call and schedule.

## 2016-08-12 NOTE — Telephone Encounter (Signed)
Leah Reeves 2051/05/17. She was calling to follow up on a referral that she was to get for a Therapist to treat her Vertigo? Her # is [phone]. Thank you

## 2016-08-14 ENCOUNTER — Ambulatory Visit
Admission: RE | Admit: 2016-08-14 | Discharge: 2016-08-14 | Disposition: A | Discharge status: Home / Self Care | Payer: BLUE CROSS/BLUE SHIELD | Source: Ambulatory Visit | Attending: Neurology | Admitting: Neurology

## 2016-08-14 DIAGNOSIS — D320 Benign neoplasm of cerebral meninges: Secondary | ICD-10-CM

## 2016-08-14 DIAGNOSIS — G500 Trigeminal neuralgia: Secondary | ICD-10-CM

## 2016-08-14 DIAGNOSIS — H8110 Benign paroxysmal vertigo, unspecified ear: Secondary | ICD-10-CM

## 2016-08-14 NOTE — Telephone Encounter (Signed)
This encounter was created in error - please disregard.

## 2016-08-17 ENCOUNTER — Other Ambulatory Visit: Payer: Self-pay

## 2016-08-17 DIAGNOSIS — H8110 Benign paroxysmal vertigo, unspecified ear: Secondary | ICD-10-CM

## 2016-08-17 DIAGNOSIS — D320 Benign neoplasm of cerebral meninges: Secondary | ICD-10-CM

## 2016-08-17 DIAGNOSIS — G500 Trigeminal neuralgia: Secondary | ICD-10-CM

## 2016-08-17 NOTE — Telephone Encounter (Signed)
Reorderd: MRI Brain w/wo, MRI Brain IAC's, MRI face  PA approval: 03/26-04/24; [place] provided Auth info.  Will call patient to schedule.

## 2016-09-04 ENCOUNTER — Ambulatory Visit
Admission: RE | Admit: 2016-09-04 | Discharge: 2016-09-04 | Disposition: A | Discharge status: Home / Self Care | Payer: BLUE CROSS/BLUE SHIELD | Source: Ambulatory Visit | Attending: Neurology | Admitting: Neurology

## 2016-09-04 DIAGNOSIS — D320 Benign neoplasm of cerebral meninges: Secondary | ICD-10-CM

## 2016-09-04 DIAGNOSIS — H8110 Benign paroxysmal vertigo, unspecified ear: Secondary | ICD-10-CM

## 2016-09-04 DIAGNOSIS — G500 Trigeminal neuralgia: Secondary | ICD-10-CM

## 2016-09-04 MED ORDER — GADOBENATE DIMEGLUMINE 529 MG/ML IV SOLN
14.0000 mL | Freq: Once | INTRAVENOUS | Status: AC | PRN
  Administered 2016-09-04: 14 mL via INTRAVENOUS

## 2016-09-07 ENCOUNTER — Telehealth: Payer: Self-pay

## 2016-09-07 NOTE — Telephone Encounter (Signed)
-----   Message from Pieter Partridge, DO sent at 09/07/2016  9:21 AM EDT ----- The MRI does not reveal any abnormality that would be contributing to the facial pain.  The meningioma is slightly larger (by 1 to 2 mm) since prior imaging in 2015.  I would like to repeat MRI of brain with and without contrast in one year (to compare) with follow up afterwards.

## 2016-09-07 NOTE — Telephone Encounter (Signed)
Called patient. Gave MRI results. Patient verbalized understanding. Will redirect referral from [facility]  to [facility].

## 2016-09-09 ENCOUNTER — Ambulatory Visit: Payer: BLUE CROSS/BLUE SHIELD | Admitting: Rehabilitative and Restorative Service Providers"

## 2016-10-27 ENCOUNTER — Other Ambulatory Visit: Payer: Self-pay | Admitting: Obstetrics & Gynecology

## 2016-10-27 DIAGNOSIS — Z1231 Encounter for screening mammogram for malignant neoplasm of breast: Secondary | ICD-10-CM

## 2016-11-13 ENCOUNTER — Ambulatory Visit
Admission: RE | Admit: 2016-11-13 | Discharge: 2016-11-13 | Disposition: A | Discharge status: Home / Self Care | Payer: BLUE CROSS/BLUE SHIELD | Source: Ambulatory Visit | Attending: Obstetrics & Gynecology | Admitting: Obstetrics & Gynecology

## 2016-11-13 DIAGNOSIS — Z1231 Encounter for screening mammogram for malignant neoplasm of breast: Secondary | ICD-10-CM

## 2017-07-15 ENCOUNTER — Ambulatory Visit
Admission: RE | Admit: 2017-07-15 | Discharge: 2017-07-15 | Disposition: A | Discharge status: Home / Self Care | Payer: Medicare Other | Source: Ambulatory Visit | Attending: Internal Medicine | Admitting: Internal Medicine

## 2017-07-15 ENCOUNTER — Other Ambulatory Visit: Payer: Self-pay | Admitting: Internal Medicine

## 2017-07-15 DIAGNOSIS — J40 Bronchitis, not specified as acute or chronic: Secondary | ICD-10-CM

## 2017-07-19 ENCOUNTER — Other Ambulatory Visit: Payer: Self-pay | Admitting: Obstetrics & Gynecology

## 2017-07-23 ENCOUNTER — Telehealth: Payer: Self-pay

## 2017-07-23 MED ORDER — PROGESTERONE MICRONIZED 100 MG PO CAPS: 100.0000 mg | ORAL_CAPSULE | Freq: Every evening | ORAL | 0 refills | Status: DC

## 2017-07-23 NOTE — Telephone Encounter (Signed)
Patient was seen at [facility] by Dr. Dellis Filbert on 07/02/2016. Next office visit on 08/11/2017. Patient requesting refill Rx Progesterone. Rx sent to [facility] in [place] [address]. Patient informed.

## 2017-08-11 ENCOUNTER — Encounter: Payer: Self-pay | Admitting: Obstetrics & Gynecology

## 2017-08-11 ENCOUNTER — Ambulatory Visit (INDEPENDENT_AMBULATORY_CARE_PROVIDER_SITE_OTHER): Payer: Medicare Other | Admitting: Obstetrics & Gynecology

## 2017-08-11 VITALS — BP 130/72 | Ht 63.0 in | Wt 148.9 lb

## 2017-08-11 DIAGNOSIS — N6320 Unspecified lump in the left breast, unspecified quadrant: Secondary | ICD-10-CM

## 2017-08-11 DIAGNOSIS — M810 Age-related osteoporosis without current pathological fracture: Secondary | ICD-10-CM

## 2017-08-11 DIAGNOSIS — Z01411 Encounter for gynecological examination (general) (routine) with abnormal findings: Secondary | ICD-10-CM | POA: Diagnosis not present

## 2017-08-11 DIAGNOSIS — M8580 Other specified disorders of bone density and structure, unspecified site: Secondary | ICD-10-CM

## 2017-08-11 DIAGNOSIS — Z780 Asymptomatic menopausal state: Secondary | ICD-10-CM

## 2017-08-11 DIAGNOSIS — N952 Postmenopausal atrophic vaginitis: Secondary | ICD-10-CM | POA: Diagnosis not present

## 2017-08-11 NOTE — Progress Notes (Signed)
Leah Reeves 03/26/1951 [id number]   History:    67 y.o. G0 Married.  Husband has children.  Newly retired, enjoying it.  RP:  Established patient presenting for annual gyn exam   HPI:  Menopause, stopped Estradiol x 6 months, still taking Prometrium.  Well with minimal hot flushes/night sweats.  No PMB.  No pelvic pain.  Vaginal dryness and mild superficial pain with IC.  Urine/BMs wnl.  Breasts wnl.  BMI 26.38.  Physically active.  Health labs with Fam MD.  Past medical history,surgical history, family history and social history were all reviewed and documented in the EPIC chart.  Gynecologic History Patient's last menstrual period was 05/25/2008. Contraception: post menopausal status Last Pap: 2018. Results were: normal per patient Last mammogram: 10/2016. Results were: Negative Bone Density: 2017 Colonoscopy: 2016  Obstetric History OB History  Gravida Para Term Preterm AB Living  0            SAB TAB Ectopic Multiple Live Births                    ROS: A ROS was performed and pertinent positives and negatives are included in the history.  GENERAL: No fevers or chills. HEENT: No change in vision, no earache, sore throat or sinus congestion. NECK: No pain or stiffness. CARDIOVASCULAR: No chest pain or pressure. No palpitations. PULMONARY: No shortness of breath, cough or wheeze. GASTROINTESTINAL: No abdominal pain, nausea, vomiting or diarrhea, melena or bright red blood per rectum. GENITOURINARY: No urinary frequency, urgency, hesitancy or dysuria. MUSCULOSKELETAL: No joint or muscle pain, no back pain, no recent trauma. DERMATOLOGIC: No rash, no itching, no lesions. ENDOCRINE: No polyuria, polydipsia, no heat or cold intolerance. No recent change in weight. HEMATOLOGICAL: No anemia or easy bruising or bleeding. NEUROLOGIC: No headache, seizures, numbness, tingling or weakness. PSYCHIATRIC: No depression, no loss of interest in normal activity or change in sleep pattern.     Exam:   BP 130/72   Ht 5\' 3"  (1.6 m)   Wt 148 lb 14.4 oz (67.5 kg)   LMP 05/25/2008   BMI 26.38 kg/m   Body mass index is 26.38 kg/m.  General appearance : Well developed well nourished female. No acute distress HEENT: Eyes: no retinal hemorrhage or exudates,  Neck supple, trachea midline, no carotid bruits, no thyroidmegaly Lungs: Clear to auscultation, no rhonchi or wheezes, or rib retractions  Heart: Regular rate and rhythm, no murmurs or gallops Breast:Examined in sitting and supine position were symmetrical in appearance.  Right breast:  No palpable masses or tenderness,  no skin retraction, no nipple inversion, no nipple discharge, no skin discoloration, no axillary or supraclavicular lymphadenopathy.  Left breast with a solid, mobile, NT 3 cm mass at 3 O'clock under the areola/nipple, no skin change, no nipple d/c.  No LN felt in axilla. Abdomen: no palpable masses or tenderness, no rebound or guarding Extremities: no edema or skin discoloration or tenderness  Pelvic: Vulva: Normal             Vagina: No gross lesions or discharge  Cervix: No gross lesions or discharge.  Pap reflex done.  Uterus  AV, normal size, shape and consistency, non-tender and mobile  Adnexa  Without masses or tenderness  Anus: Normal   Assessment/Plan:  67 y.o. female for annual exam   1. Encounter for gynecological examination with abnormal finding Normal gynecologic exam in menopause.  Pap reflex done.  Breast exam revealing a left breast mass.  Colonoscopy 2016.  Health labs with family physician.  2. Menopause present Well off of estradiol patch x 6 months.  Will now stop Prometrium as well.  No postmenopausal bleeding.    3. Post-menopause atrophic vaginitis Vaginal dryness with pain when having intercourse.  Recommend coconut oil as needed.  And will use Estradiol cream a quarter of an applicator twice a week as needed.  4. Left breast lump Left breast mass: 3 cm solid, NT mass at 3  O'clock under the nipple/aureola.  Lt Dx mammo/US will be scheduled as soon as possible.  5. Osteopenia after menopause Vitamin D supplements, calcium rich nutrition and regular weightbearing physical activity recommended.  Patient will follow-up here for a bone density. - DG Bone Density; Future  Other orders Estradiol cream, will call for represcription when needed. D/C Prometrium.  Counseling and coordination of care on above issues more than 50% for 10 minutes.  Princess Bruins MD, 11:57 AM 08/11/2017

## 2017-08-12 ENCOUNTER — Telehealth: Payer: Self-pay | Admitting: *Deleted

## 2017-08-12 DIAGNOSIS — N6320 Unspecified lump in the left breast, unspecified quadrant: Secondary | ICD-10-CM

## 2017-08-12 NOTE — Telephone Encounter (Signed)
-----   Message from Princess Bruins, MD sent at 08/11/2017 12:18 PM EDT ----- Regarding: Schedule Left Dx mammo/Breast US Left breast mass/density at 3 O'clock, under the nipple/aureola.  Diameter about 3 cm, non-tender, solid.

## 2017-08-12 NOTE — Telephone Encounter (Signed)
Pt scheduled 08/16/17 @ 1:00pm at breast center. Pt informed,.

## 2017-08-13 LAB — PAP IG W/ RFLX HPV ASCU

## 2017-08-15 ENCOUNTER — Encounter: Payer: Self-pay | Admitting: Obstetrics & Gynecology

## 2017-08-15 NOTE — Patient Instructions (Signed)
1. Encounter for gynecological examination with abnormal finding Normal gynecologic exam in menopause.  Pap reflex done.  Breast exam revealing a left breast mass.  Colonoscopy 2016.  Health labs with family physician.  2. Menopause present Well off of estradiol patch x 6 months.  Will now stop Prometrium as well.  No postmenopausal bleeding.    3. Post-menopause atrophic vaginitis Vaginal dryness with pain when having intercourse.  Recommend coconut oil as needed.  And will use Estradiol cream a quarter of an applicator twice a week as needed.  4. Left breast lump Left breast mass: 3 cm solid, NT mass at 3 O'clock under the nipple/aureola.  Lt Dx mammo/US will be scheduled as soon as possible.  5. Osteopenia after menopause Vitamin D supplements, calcium rich nutrition and regular weightbearing physical activity recommended.  Patient will follow-up here for a bone density. - DG Bone Density; Future  Other orders Estradiol cream, will call for represcription when needed. D/C Prometrium.  Leah Reeves, it was a pleasure seeing you today!  I will inform you of your results as soon as they are available.   Health Maintenance for Postmenopausal Women Menopause is a normal process in which your reproductive ability comes to an end. This process happens gradually over a span of months to years, usually between the ages of 45 and 67. Menopause is complete when you have missed 12 consecutive menstrual periods. It is important to talk with your health care provider about some of the most common conditions that affect postmenopausal women, such as heart disease, cancer, and bone loss (osteoporosis). Adopting a healthy lifestyle and getting preventive care can help to promote your health and wellness. Those actions can also lower your chances of developing some of these common conditions. What should I know about menopause? During menopause, you may experience a number of symptoms, such  as:  Moderate-to-severe hot flashes.  Night sweats.  Decrease in sex drive.  Mood swings.  Headaches.  Tiredness.  Irritability.  Memory problems.  Insomnia.  Choosing to treat or not to treat menopausal changes is an individual decision that you make with your health care provider. What should I know about hormone replacement therapy and supplements? Hormone therapy products are effective for treating symptoms that are associated with menopause, such as hot flashes and night sweats. Hormone replacement carries certain risks, especially as you become older. If you are thinking about using estrogen or estrogen with progestin treatments, discuss the benefits and risks with your health care provider. What should I know about heart disease and stroke? Heart disease, heart attack, and stroke become more likely as you age. This may be due, in part, to the hormonal changes that your body experiences during menopause. These can affect how your body processes dietary fats, triglycerides, and cholesterol. Heart attack and stroke are both medical emergencies. There are many things that you can do to help prevent heart disease and stroke:  Have your blood pressure checked at least every 1-2 years. High blood pressure causes heart disease and increases the risk of stroke.  If you are 1-17 years old, ask your health care provider if you should take aspirin to prevent a heart attack or a stroke.  Do not use any tobacco products, including cigarettes, chewing tobacco, or electronic cigarettes. If you need help quitting, ask your health care provider.  It is important to eat a healthy diet and maintain a healthy weight. ? Be sure to include plenty of vegetables, fruits, low-fat dairy products, and  lean protein. ? Avoid eating foods that are high in solid fats, added sugars, or salt (sodium).  Get regular exercise. This is one of the most important things that you can do for your health. ? Try  to exercise for at least 150 minutes each week. The type of exercise that you do should increase your heart rate and make you sweat. This is known as moderate-intensity exercise. ? Try to do strengthening exercises at least twice each week. Do these in addition to the moderate-intensity exercise.  Know your numbers.Ask your health care provider to check your cholesterol and your blood glucose. Continue to have your blood tested as directed by your health care provider.  What should I know about cancer screening? There are several types of cancer. Take the following steps to reduce your risk and to catch any cancer development as early as possible. Breast Cancer  Practice breast self-awareness. ? This means understanding how your breasts normally appear and feel. ? It also means doing regular breast self-exams. Let your health care provider know about any changes, no matter how small.  If you are 67 or older, have a clinician do a breast exam (clinical breast exam or CBE) every year. Depending on your age, family history, and medical history, it may be recommended that you also have a yearly breast X-ray (mammogram).  If you have a family history of breast cancer, talk with your health care provider about genetic screening.  If you are at high risk for breast cancer, talk with your health care provider about having an MRI and a mammogram every year.  Breast cancer (BRCA) gene test is recommended for women who have family members with BRCA-related cancers. Results of the assessment will determine the need for genetic counseling and BRCA1 and for BRCA2 testing. BRCA-related cancers include these types: ? Breast. This occurs in males or females. ? Ovarian. ? Tubal. This may also be called fallopian tube cancer. ? Cancer of the abdominal or pelvic lining (peritoneal cancer). ? Prostate. ? Pancreatic.  Cervical, Uterine, and Ovarian Cancer Your health care provider may recommend that you be  screened regularly for cancer of the pelvic organs. These include your ovaries, uterus, and vagina. This screening involves a pelvic exam, which includes checking for microscopic changes to the surface of your cervix (Pap test).  For women ages 21-65, health care providers may recommend a pelvic exam and a Pap test every three years. For women ages 43-65, they may recommend the Pap test and pelvic exam, combined with testing for human papilloma virus (HPV), every five years. Some types of HPV increase your risk of cervical cancer. Testing for HPV may also be done on women of any age who have unclear Pap test results.  Other health care providers may not recommend any screening for nonpregnant women who are considered low risk for pelvic cancer and have no symptoms. Ask your health care provider if a screening pelvic exam is right for you.  If you have had past treatment for cervical cancer or a condition that could lead to cancer, you need Pap tests and screening for cancer for at least 20 years after your treatment. If Pap tests have been discontinued for you, your risk factors (such as having a new sexual partner) need to be reassessed to determine if you should start having screenings again. Some women have medical problems that increase the chance of getting cervical cancer. In these cases, your health care provider may recommend that you have  screening and Pap tests more often.  If you have a family history of uterine cancer or ovarian cancer, talk with your health care provider about genetic screening.  If you have vaginal bleeding after reaching menopause, tell your health care provider.  There are currently no reliable tests available to screen for ovarian cancer.  Lung Cancer Lung cancer screening is recommended for adults 6-84 years old who are at high risk for lung cancer because of a history of smoking. A yearly low-dose CT scan of the lungs is recommended if you:  Currently  smoke.  Have a history of at least 30 pack-years of smoking and you currently smoke or have quit within the past 15 years. A pack-year is smoking an average of one pack of cigarettes per day for one year.  Yearly screening should:  Continue until it has been 15 years since you quit.  Stop if you develop a health problem that would prevent you from having lung cancer treatment.  Colorectal Cancer  This type of cancer can be detected and can often be prevented.  Routine colorectal cancer screening usually begins at age 4 and continues through age 79.  If you have risk factors for colon cancer, your health care provider may recommend that you be screened at an earlier age.  If you have a family history of colorectal cancer, talk with your health care provider about genetic screening.  Your health care provider may also recommend using home test kits to check for hidden blood in your stool.  A small camera at the end of a tube can be used to examine your colon directly (sigmoidoscopy or colonoscopy). This is done to check for the earliest forms of colorectal cancer.  Direct examination of the colon should be repeated every 5-10 years until age 32. However, if early forms of precancerous polyps or small growths are found or if you have a family history or genetic risk for colorectal cancer, you may need to be screened more often.  Skin Cancer  Check your skin from head to toe regularly.  Monitor any moles. Be sure to tell your health care provider: ? About any new moles or changes in moles, especially if there is a change in a mole's shape or color. ? If you have a mole that is larger than the size of a pencil eraser.  If any of your family members has a history of skin cancer, especially at a young age, talk with your health care provider about genetic screening.  Always use sunscreen. Apply sunscreen liberally and repeatedly throughout the day.  Whenever you are outside, protect  yourself by wearing long sleeves, pants, a wide-brimmed hat, and sunglasses.  What should I know about osteoporosis? Osteoporosis is a condition in which bone destruction happens more quickly than new bone creation. After menopause, you may be at an increased risk for osteoporosis. To help prevent osteoporosis or the bone fractures that can happen because of osteoporosis, the following is recommended:  If you are 41-65 years old, get at least 1,000 mg of calcium and at least 600 mg of vitamin D per day.  If you are older than age 46 but younger than age 27, get at least 1,200 mg of calcium and at least 600 mg of vitamin D per day.  If you are older than age 6, get at least 1,200 mg of calcium and at least 800 mg of vitamin D per day.  Smoking and excessive alcohol intake increase the risk  of osteoporosis. Eat foods that are rich in calcium and vitamin D, and do weight-bearing exercises several times each week as directed by your health care provider. What should I know about how menopause affects my mental health? Depression may occur at any age, but it is more common as you become older. Common symptoms of depression include:  Low or sad mood.  Changes in sleep patterns.  Changes in appetite or eating patterns.  Feeling an overall lack of motivation or enjoyment of activities that you previously enjoyed.  Frequent crying spells.  Talk with your health care provider if you think that you are experiencing depression. What should I know about immunizations? It is important that you get and maintain your immunizations. These include:  Tetanus, diphtheria, and pertussis (Tdap) booster vaccine.  Influenza every year before the flu season begins.  Pneumonia vaccine.  Shingles vaccine.  Your health care provider may also recommend other immunizations. This information is not intended to replace advice given to you by your health care provider. Make sure you discuss any questions you  have with your health care provider. Document Released: 07/03/2005 Document Revised: 11/29/2015 Document Reviewed: 02/12/2015 Elsevier Interactive Patient Education  2018 [employer].

## 2017-08-16 ENCOUNTER — Other Ambulatory Visit: Payer: Self-pay | Admitting: Obstetrics & Gynecology

## 2017-08-16 ENCOUNTER — Ambulatory Visit
Admission: RE | Admit: 2017-08-16 | Discharge: 2017-08-16 | Disposition: A | Discharge status: Home / Self Care | Payer: Medicare Other | Source: Ambulatory Visit | Attending: Obstetrics & Gynecology | Admitting: Obstetrics & Gynecology

## 2017-08-16 DIAGNOSIS — N6320 Unspecified lump in the left breast, unspecified quadrant: Secondary | ICD-10-CM

## 2017-08-16 DIAGNOSIS — Z1231 Encounter for screening mammogram for malignant neoplasm of breast: Secondary | ICD-10-CM

## 2017-08-18 ENCOUNTER — Other Ambulatory Visit: Payer: Self-pay | Admitting: Obstetrics & Gynecology

## 2017-09-09 ENCOUNTER — Ambulatory Visit: Payer: Medicare Other | Admitting: Internal Medicine

## 2017-09-09 ENCOUNTER — Encounter: Payer: Self-pay | Admitting: Internal Medicine

## 2017-09-09 ENCOUNTER — Other Ambulatory Visit (INDEPENDENT_AMBULATORY_CARE_PROVIDER_SITE_OTHER): Payer: Medicare Other

## 2017-09-09 VITALS — BP 142/80 | HR 64 | Ht 64.0 in | Wt 149.4 lb

## 2017-09-09 DIAGNOSIS — J45991 Cough variant asthma: Secondary | ICD-10-CM | POA: Diagnosis not present

## 2017-09-09 LAB — CBC WITH DIFFERENTIAL/PLATELET
BASOS PCT: 1.2 % (ref 0.0–3.0)
Basophils Absolute: 0.1 10*3/uL (ref 0.0–0.1)
EOS PCT: 3.9 % (ref 0.0–5.0)
Eosinophils Absolute: 0.4 10*3/uL (ref 0.0–0.7)
HEMATOCRIT: 42 % (ref 36.0–46.0)
HEMOGLOBIN: 14.1 g/dL (ref 12.0–15.0)
LYMPHS PCT: 35.5 % (ref 12.0–46.0)
Lymphs Abs: 3.5 10*3/uL (ref 0.7–4.0)
MCHC: 33.5 g/dL (ref 30.0–36.0)
MCV: 90.9 fl (ref 78.0–100.0)
MONOS PCT: 8.4 % (ref 3.0–12.0)
Monocytes Absolute: 0.8 10*3/uL (ref 0.1–1.0)
Neutro Abs: 5.1 10*3/uL (ref 1.4–7.7)
Neutrophils Relative %: 51 % (ref 43.0–77.0)
Platelets: 321 10*3/uL (ref 150.0–400.0)
RBC: 4.63 Mil/uL (ref 3.87–5.11)
RDW: 13.4 % (ref 11.5–15.5)
WBC: 9.9 10*3/uL (ref 4.0–10.5)

## 2017-09-09 LAB — SEDIMENTATION RATE: Sed Rate: 9 mm/hr (ref 0–30)

## 2017-09-09 LAB — NITRIC OXIDE: Nitric Oxide: 36

## 2017-09-09 MED ORDER — BUDESONIDE-FORMOTEROL FUMARATE 80-4.5 MCG/ACT IN AERO: 2.0000 | INHALATION_SPRAY | Freq: Two times a day (BID) | RESPIRATORY_TRACT | 0 refills | Status: DC

## 2017-09-09 MED ORDER — FAMOTIDINE 20 MG PO TABS: ORAL_TABLET | ORAL | 11 refills | Status: DC

## 2017-09-09 MED ORDER — PANTOPRAZOLE SODIUM 40 MG PO TBEC: 40.0000 mg | DELAYED_RELEASE_TABLET | Freq: Every day | ORAL | 2 refills | Status: DC

## 2017-09-09 NOTE — Progress Notes (Signed)
Subjective:     Patient ID: Leah Reeves, female   DOB: [birth year]      MRN: [id number]  HPI  43 yowf quit smoking in 1993 with no chronic or recurrent resp problems then indolent onset of persistent cough  the week after xmas 2018 s acute antecedent uri pattern > Dr Clayton Bibles eval in Jan 2019 rx pred > cxr 06/2017 was fine rx amox >  Then 2 inhalers and shot of prednisone > no better the breo and so referred to pulmonary clinic 09/09/2017 by Dr   Fara Olden  Note prev seen in pulmonary clinic 05/08/2008 dx cough p uri > resolved on gerd rx    09/09/2017 1st [facility] office visit/ Jery Hollern   Chief Complaint  Patient presents with  . Pulmonary Consult    Referred by [name]. Pt c/o cough since end of Dec 2018. Cough is prod with thick, clear to "cloudy" sputum.  It does not bother her at night.   most productive after 2pm up till 8 pm and never noct never purulent just  thick white mucus Not limited by breathing from desired activities   No better on breo     No obvious day to day or daytime variability or assoc  mucus plugs or hemoptysis or cp or chest tightness, subjective wheeze or overt sinus or hb symptoms. No unusual exposure hx or h/o childhood pna/ asthma or knowledge of premature birth.  Sleeping ok flat without nocturnal  or early am exacerbation  of respiratory  c/o's or need for noct saba. Also denies any obvious fluctuation of symptoms with weather or environmental changes or other aggravating or alleviating factors except as outlined above   Current Allergies, Complete Past Medical History, Past Surgical History, Family History, and Social History were reviewed in [employer] record.  ROS  The following are not active complaints unless bolded Hoarseness, sore throat, dysphagia, dental problems, itching, sneezing,  nasal congestion or discharge of excess mucus or purulent secretions, ear ache,   fever, chills, sweats, unintended wt loss or wt gain,  classically pleuritic or exertional cp,  orthopnea pnd or leg swelling, presyncope, palpitations, abdominal pain, anorexia, nausea, vomiting, diarrhea  or change in bowel habits or change in bladder habits, change in stools or change in urine, dysuria, hematuria,  rash, arthralgias, visual complaints, headache, numbness, weakness or ataxia or problems with walking or coordination,  change in mood/affect or memory.        Current Meds  Medication Sig  . ALPRAZolam (XANAX) 0.25 MG tablet Take 0.25 mg by mouth 2 (two) times daily as needed for anxiety.  Marland Kitchen aspirin EC 81 MG tablet Take 81 mg by mouth daily.  Marland Kitchen buPROPion (WELLBUTRIN XL) 150 MG 24 hr tablet Take 150 mg by mouth every morning.  . cholecalciferol (VITAMIN D) 1000 units tablet Take 2,000 Units by mouth daily.  Marland Kitchen estradiol (ESTRACE) 0.1 MG/GM vaginal cream APPLY 1/4 APPLICATORFUL TWICE WEEKLY AS DIRECTED  . FLUoxetine (PROZAC) 20 MG tablet Take 40 mg by mouth every morning.   . fluticasone furoate-vilanterol (BREO ELLIPTA) 100-25 MCG/INH AEPB Inhale 1 puff into the lungs daily.  . simvastatin (ZOCOR) 40 MG tablet Take 40 mg by mouth every morning.          Review of Systemsen      Objective:   Physical Exam    amb wf harsh coughing fit and nasal tone to voice / cough on insp > exp  Wt Readings from Last  3 Encounters:  09/09/17 149 lb 6.4 oz (67.8 kg)  08/11/17 148 lb 14.4 oz (67.5 kg)  08/03/16 155 lb 11.2 oz (70.6 kg)     Vital signs reviewed - Note on arrival 02 sats  98% on RA     HEENT: nl dentition, turbinates bilaterally, and oropharynx. Nl external ear canals without cough reflex   NECK :  without JVD/Nodes/TM/ nl carotid upstrokes bilaterally   LUNGS: no acc muscle use,  Nl contour chest which is clear to A and P bilaterally without cough on insp or exp maneuvers   CV:  RRR  no s3 or murmur or increase in P2, and no edema   ABD:  soft and nontender with nl inspiratory excursion in the supine position. No  bruits or organomegaly appreciated, bowel sounds nl  MS:  Nl gait/ ext warm without deformities, calf tenderness, cyanosis or clubbing No obvious joint restrictions   SKIN: warm and dry without lesions    NEURO:  alert, approp, nl sensorium with  no motor or cerebellar deficits apparent.         Labs ordered 09/09/2017  Allergy profile    I personally reviewed images and agree with radiology impression as follows:  CXR:   07/15/17 No active cardiopulmonary disease. Assessment:

## 2017-09-09 NOTE — Patient Instructions (Addendum)
Stop breo and start symbicort 80 Take 2 puffs first thing in am and then another 2 puffs about 12 hours later.    Work on inhaler technique:  relax and gently blow all the way out then take a nice smooth deep breath back in, triggering the inhaler at same time you start breathing in.  Hold for up to 5 seconds if you can. Blow out thru nose. Rinse and gargle with water when done    Pantoprazole (protonix) 40 mg   Take  30-60 min before first meal of the day and Pepcid (famotidine)  20 mg one @  bedtime until return to office - this is the best way to tell whether stomach acid is contributing to your problem.     GERD (REFLUX)  is an extremely common cause of respiratory symptoms just like yours , many times with no obvious heartburn at all.    It can be treated with medication, but also with lifestyle changes including elevation of the head of your bed (ideally with 6 inch  bed blocks),  Smoking cessation, avoidance of late meals, excessive alcohol, and avoid fatty foods, chocolate, peppermint, colas, red wine, and acidic juices such as orange juice.  NO MINT OR MENTHOL PRODUCTS SO NO COUGH DROPS   USE SUGARLESS CANDY INSTEAD (Jolley ranchers or Stover's or Life Savers) or even ice chips will also do - the key is to swallow to prevent all throat clearing. NO OIL BASED VITAMINS - use powdered substitutes.  We will call next week to arrange sinus CT if not better by then    Please remember to go to the lab department downstairs in the basement  for your tests - we will call you with the results when they are available.       Please schedule a follow up office visit in 2 weeks, sooner if needed with all meds in hand

## 2017-09-10 ENCOUNTER — Encounter: Payer: Self-pay | Admitting: Internal Medicine

## 2017-09-10 LAB — RESPIRATORY ALLERGY PROFILE REGION II ~~LOC~~
Allergen, A. alternata, m6: <0.1 kU/L
Allergen, Comm Silver Birch, t9: <0.1 kU/L
Allergen, Cottonwood, t14: <0.1 kU/L
Allergen, Mouse Urine Protein, e78: <0.1 kU/L
Aspergillus fumigatus, m3: <0.1 kU/L
Bermuda Grass: <0.1 kU/L
Box Elder IgE: <0.1 kU/L
CAT DANDER: 1.3 kU/L — AB
CLASS: 0
CLASS: 0
CLASS: 0
CLASS: 0
CLASS: 0
CLASS: 0
CLASS: 0
CLASS: 0
CLASS: 0
CLASS: 0
CLASS: 0
CLASS: 0
CLASS: 2
Class: 0
Class: 0
Class: 0
Class: 0
Class: 0
Class: 0
Class: 0
Class: 0
Class: 0
Class: 0
Class: 0
Dog Dander: 0.19 kU/L — ABNORMAL HIGH
Elm IgE: <0.1 kU/L
IgE (Immunoglobulin E), Serum: 29 kU/L (ref <=114)
Johnson Grass: <0.1 kU/L
Pecan/Hickory Tree IgE: <0.1 kU/L
Sheep Sorrel IgE: <0.1 kU/L
Timothy Grass: <0.1 kU/L

## 2017-09-10 LAB — INTERPRETATION:

## 2017-09-10 NOTE — Assessment & Plan Note (Addendum)
Allergy profile 09/09/2017 >  Eos 0.4 /  IgE   - FENO 09/09/2017  =   36  - 09/09/2017  After extensive coaching inhaler device  effectiveness =    75% try stop BREO and start symb 80 2bid   The most common causes of chronic cough in immunocompetent adults include the following: upper airway cough syndrome (UACS), previously referred to as postnasal drip syndrome (PNDS), which is caused by variety of rhinosinus conditions; (2) asthma; (3) GERD; (4) chronic bronchitis from cigarette smoking or other inhaled environmental irritants; (5) nonasthmatic eosinophilic bronchitis; and (6) bronchiectasis.   These conditions, singly or in combination, have accounted for up to 94% of the causes of chronic cough in prospective studies.   Other conditions have constituted no >6% of the causes in prospective studies These have included bronchogenic carcinoma, chronic interstitial pneumonia, sarcoidosis, left ventricular failure, ACEI-induced cough, and aspiration from a condition associated with pharyngeal dysfunction.    Chronic cough is often simultaneously caused by more than one condition. A single cause has been found from 38 to 82% of the time, multiple causes from 18 to 62%. Multiply caused cough has been the result of three diseases up to 42% of the time.       ddx is really between asthma and Upper airway cough syndrome (previously labeled PNDS),  is so named because it's frequently impossible to sort out how much is  CR/sinusitis with freq throat clearing (which can be related to primary GERD)   vs  causing  secondary (" extra esophageal")  GERD from wide swings in gastric pressure that occur with throat clearing, often  promoting self use of mint and menthol lozenges that reduce the lower esophageal sphincter tone and exacerbate the problem further in a cyclical fashion.   These are the same pts (now being labeled as having "irritable larynx syndrome" by some cough centers) who not infrequently have a  history of having failed to tolerate ace inhibitors,  dry powder inhalers as may be the case here  or biphosphonates or report having atypical/extraesophageal reflux symptoms that don't respond to standard doses of PPI  and are easily confused as having aecopd or asthma flares by even experienced allergists/ pulmonologists (myself included).   Of the three most common causes of  Sub-acute / recurrent or chronic cough, only one (GERD)  can actually contribute to/ trigger  the other two (asthma and post nasal drip syndrome)  and perpetuate the cylce of cough.  While not intuitively obvious, many patients with chronic low grade reflux do not cough until there is a primary insult that disturbs the protective epithelial barrier and exposes sensitive nerve endings.   This is typically viral but can due to PNDS and  either may apply here and appears to have been the case in 2009 at last ov as well.     The point is that once this occurs, it is difficult to eliminate the cycle  using anything but a maximally effective acid suppression regimen at least in the short run, accompanied by an appropriate diet to address non acid GERD.    rec max rx  Cough variant asthma issue with symb 80 2  Bid instead of breo and max  gerd rx and  then sinus ct is next step if not improving

## 2017-09-13 ENCOUNTER — Telehealth: Payer: Self-pay | Admitting: Internal Medicine

## 2017-09-13 NOTE — Telephone Encounter (Signed)
Notes recorded by Tanda Rockers, MD on 09/10/2017 at 3:33 PM EDT Call patient : Studies are c/w mild allergies Pos cat > dog : Avoidance only for now ----------------------------- Spoke with pt. She is aware of results. Nothing further was needed.

## 2017-09-13 NOTE — Progress Notes (Signed)
LMTCB

## 2017-09-24 ENCOUNTER — Institutional Professional Consult (permissible substitution): Payer: Medicare Other | Admitting: Pulmonary Disease

## 2017-09-28 ENCOUNTER — Encounter: Payer: Self-pay | Admitting: Internal Medicine

## 2017-09-28 ENCOUNTER — Ambulatory Visit: Payer: Medicare Other | Admitting: Internal Medicine

## 2017-09-28 VITALS — BP 120/70 | HR 66 | Ht 64.0 in | Wt 148.0 lb

## 2017-09-28 DIAGNOSIS — R059 Cough, unspecified: Secondary | ICD-10-CM

## 2017-09-28 DIAGNOSIS — J45991 Cough variant asthma: Secondary | ICD-10-CM

## 2017-09-28 DIAGNOSIS — R05 Cough: Secondary | ICD-10-CM | POA: Diagnosis not present

## 2017-09-28 MED ORDER — BUDESONIDE-FORMOTEROL FUMARATE 80-4.5 MCG/ACT IN AERO: 2.0000 | INHALATION_SPRAY | Freq: Two times a day (BID) | RESPIRATORY_TRACT | 0 refills | Status: DC

## 2017-09-28 MED ORDER — MONTELUKAST SODIUM 10 MG PO TABS: 10.0000 mg | ORAL_TABLET | Freq: Every day | ORAL | 11 refills | Status: DC

## 2017-09-28 NOTE — Patient Instructions (Addendum)
Add singulair 10 mg one daily x  two weeks  then stop symbicort   Please see patient coordinator before you leave today  to schedule sinus Ct    Please schedule a follow up office visit in 4 weeks, sooner if needed

## 2017-09-28 NOTE — Progress Notes (Signed)
Subjective:     Patient ID: Leah Reeves, female   DOB: 02-Sep-1950      MRN: [id number]    Brief patient profile:  20 yowf quit smoking in 1993 with no chronic or recurrent resp problems then indolent onset of persistent cough  the week after xmas 2018 s acute antecedent uri pattern > Dr Clayton Bibles eval in Jan 2019 rx pred > cxr 06/2017 was fine rx amox >  Then 2 inhalers and shot of prednisone > no better the breo and so referred to pulmonary clinic 09/09/2017 by Dr   Fara Olden  Note prev seen in pulmonary clinic 05/08/2008 dx cough p uri > resolved on gerd rx    History of Present Illness  09/09/2017 1st [facility] office visit/ Leah Reeves   Chief Complaint  Patient presents with  . Pulmonary Consult    Referred by [name]. Pt c/o cough since end of Dec 2018. Cough is prod with thick, clear to "cloudy" sputum.  It does not bother her at night.   most productive after 2pm up till 8 pm and never noct never purulent just  thick white mucus No better on breo  rec Stop breo and start symbicort 80 Take 2 puffs first thing in am and then another 2 puffs about 12 hours later.  Work on inhaler technique:   Pantoprazole (protonix) 40 mg   Take  30-60 min before first meal of the day and Pepcid (famotidine)  20 mg one @  bedtime until return to office - this is the best way to tell whether stomach acid is contributing to your problem.   GERD We will call next week to arrange sinus CT if not better by then     09/28/2017  f/u ov/Leah Reeves re: cough variant asthma vs uacs Chief Complaint  Patient presents with  . Follow-up    Cough has improved some, but not completely resolved. She is coughing up some clear sputum.   Dyspnea:  Not limited by breathing from desired activities   Cough: still worse in afternoons  Sleep: fine  SABA use:  None, no better in past with albuterol    No obvious day to day or daytime variability or assoc excess/ purulent sputum or mucus plugs or hemoptysis or cp or chest  tightness, subjective wheeze or overt sinus or hb symptoms. No unusual exposure hx or h/o childhood pna/ asthma or knowledge of premature birth.  Sleeping  Ok flat  without nocturnal  or early am exacerbation  of respiratory  c/o's or need for noct saba. Also denies any obvious fluctuation of symptoms with weather or environmental changes or other aggravating or alleviating factors except as outlined above   Current Allergies, Complete Past Medical History, Past Surgical History, Family History, and Social History were reviewed in [employer] record.  ROS  The following are not active complaints unless bolded Hoarseness, sore throat, dysphagia, dental problems, itching, sneezing,  nasal congestion or discharge of excess mucus or purulent secretions, ear ache,   fever, chills, sweats, unintended wt loss or wt gain, classically pleuritic or exertional cp,  orthopnea pnd or arm/hand swelling  or leg swelling, presyncope, palpitations, abdominal pain, anorexia, nausea, vomiting, diarrhea  or change in bowel habits or change in bladder habits, change in stools or change in urine, dysuria, hematuria,  rash, arthralgias, visual complaints, headache, numbness, weakness or ataxia or problems with walking or coordination,  change in mood or  memory.  Current Meds  Medication Sig  . ALPRAZolam (XANAX) 0.25 MG tablet Take 0.25 mg by mouth 2 (two) times daily as needed for anxiety.  Marland Kitchen aspirin EC 81 MG tablet Take 81 mg by mouth daily.  . budesonide-formoterol (SYMBICORT) 80-4.5 MCG/ACT inhaler Inhale 2 puffs into the lungs 2 (two) times daily.  Marland Kitchen buPROPion (WELLBUTRIN XL) 150 MG 24 hr tablet Take 150 mg by mouth every morning.  . cholecalciferol (VITAMIN D) 1000 units tablet Take 2,000 Units by mouth daily.  Marland Kitchen estradiol (ESTRACE) 0.1 MG/GM vaginal cream APPLY 1/4 APPLICATORFUL TWICE WEEKLY AS DIRECTED  . famotidine (PEPCID) 20 MG tablet One at bedtime  . FLUoxetine (PROZAC) 20 MG  tablet Take 40 mg by mouth every morning.   . pantoprazole (PROTONIX) 40 MG tablet Take 1 tablet (40 mg total) by mouth daily. Take 30-60 min before first meal of the day  . simvastatin (ZOCOR) 40 MG tablet Take 40 mg by mouth every morning.  .                        Objective:   Physical Exam  amb wf nasal tone to voice   09/28/2017            148   09/09/17 149 lb 6.4 oz (67.8 kg)  08/11/17 148 lb 14.4 oz (67.5 kg)  08/03/16 155 lb 11.2 oz (70.6 kg)    Vital signs reviewed - Note on arrival 02 sats  98% on RA   HEENT: nl dentition, turbinates bilaterally, and oropharynx. Nl external ear canals without cough reflex   NECK :  without JVD/Nodes/TM/ nl carotid upstrokes bilaterally   LUNGS: no acc muscle use,  Nl contour chest which is clear to A and P bilaterally without cough on insp or exp maneuvers   CV:  RRR  no s3 or murmur or increase in P2, and no edema   ABD:  soft and nontender with nl inspiratory excursion in the supine position. No bruits or organomegaly appreciated, bowel sounds nl  MS:  Nl gait/ ext warm without deformities, calf tenderness, cyanosis or clubbing No obvious joint restrictions   SKIN: warm and dry without lesions    NEURO:  alert, approp, nl sensorium with  no motor or cerebellar deficits apparent.                 Assessment:

## 2017-09-29 ENCOUNTER — Encounter: Payer: Self-pay | Admitting: Internal Medicine

## 2017-09-29 NOTE — Assessment & Plan Note (Addendum)
Spirometry  07/26/17  wnl  Allergy profile 09/09/2017 >  Eos 0.4 /  IgE  29 Rast Pos cat > dog - FENO 09/09/2017  =   36  - 09/09/2017  After extensive coaching inhaler device  effectiveness =    75% try stop BREO and start symb 80 2bid   - trial of singulair 09/28/2017  - 09/28/2017  After extensive coaching inhaler device  effectiveness =    90%  - Sinus CT ordered   Lack of cough resolution on a verified empirical regimen could mean an alternative diagnosis(pnds from allergic rhinitis rather than cough variant asthma and since singulair treats both is a reasonable next step combined with symbicort for the first week then by itself until returns) , persistence of the disease state (eg sinusitis(so need sinus ct next)  or bronchiectasis) , or inadequacy of currently available therapy (eg no medical rx available for non-acid gerd which is always a concern though she does not have the typical body habitus and is not on progesterone she did appear to respond previously to gerd rx perhaps based on the cyclical nature of cough > secondary gerd >  More cough so should continue  Max gerd rx and diet for now)   I had an extended discussion with the patient reviewing all relevant studies completed to date and  lasting 15 to 20 minutes of a 25 minute visit    See device teaching which extended face to face time for this visit   Each maintenance medication was reviewed in detail including most importantly the difference between maintenance and prns and under what circumstances the prns are to be triggered using an action plan format that is not reflected in the computer generated alphabetically organized AVS.    Please see AVS for specific instructions unique to this visit that I personally wrote and verbalized to the the pt in detail and then reviewed with pt  by my nurse highlighting any  changes in therapy recommended at today's visit to their plan of care.

## 2017-10-05 ENCOUNTER — Ambulatory Visit (INDEPENDENT_AMBULATORY_CARE_PROVIDER_SITE_OTHER)
Admission: RE | Admit: 2017-10-05 | Discharge: 2017-10-05 | Disposition: A | Discharge status: Home / Self Care | Payer: Medicare Other | Source: Ambulatory Visit | Attending: Internal Medicine | Admitting: Internal Medicine

## 2017-10-05 DIAGNOSIS — J45991 Cough variant asthma: Secondary | ICD-10-CM | POA: Diagnosis not present

## 2017-10-05 DIAGNOSIS — R05 Cough: Secondary | ICD-10-CM

## 2017-10-05 DIAGNOSIS — R059 Cough, unspecified: Secondary | ICD-10-CM

## 2017-10-06 ENCOUNTER — Other Ambulatory Visit: Payer: Self-pay | Admitting: *Deleted

## 2017-10-06 MED ORDER — AMOXICILLIN-POT CLAVULANATE 875-125 MG PO TABS: 1.0000 | ORAL_TABLET | Freq: Two times a day (BID) | ORAL | 0 refills | Status: DC

## 2017-10-26 ENCOUNTER — Encounter (HOSPITAL_COMMUNITY): Payer: Self-pay | Admitting: Emergency Medicine

## 2017-10-26 ENCOUNTER — Other Ambulatory Visit: Payer: Self-pay

## 2017-10-26 ENCOUNTER — Emergency Department (HOSPITAL_COMMUNITY)
Admission: EM | Admit: 2017-10-26 | Discharge: 2017-10-26 | Disposition: A | Discharge status: Home / Self Care | Payer: Medicare Other | Attending: Emergency Medicine | Admitting: Emergency Medicine

## 2017-10-26 ENCOUNTER — Encounter: Payer: Self-pay | Admitting: Internal Medicine

## 2017-10-26 ENCOUNTER — Emergency Department (HOSPITAL_COMMUNITY): Payer: Medicare Other

## 2017-10-26 ENCOUNTER — Ambulatory Visit: Payer: Medicare Other | Admitting: Internal Medicine

## 2017-10-26 VITALS — BP 96/60 | HR 144 | Ht 64.0 in | Wt 144.8 lb

## 2017-10-26 DIAGNOSIS — R002 Palpitations: Secondary | ICD-10-CM | POA: Insufficient documentation

## 2017-10-26 DIAGNOSIS — Z87891 Personal history of nicotine dependence: Secondary | ICD-10-CM | POA: Diagnosis not present

## 2017-10-26 DIAGNOSIS — I480 Paroxysmal atrial fibrillation: Secondary | ICD-10-CM | POA: Diagnosis not present

## 2017-10-26 DIAGNOSIS — J45991 Cough variant asthma: Secondary | ICD-10-CM | POA: Diagnosis not present

## 2017-10-26 DIAGNOSIS — Z79899 Other long term (current) drug therapy: Secondary | ICD-10-CM | POA: Diagnosis not present

## 2017-10-26 DIAGNOSIS — R05 Cough: Secondary | ICD-10-CM | POA: Insufficient documentation

## 2017-10-26 LAB — CBC
HCT: 47.3 % — ABNORMAL HIGH (ref 36.0–46.0)
Hemoglobin: 15.3 g/dL — ABNORMAL HIGH (ref 12.0–15.0)
MCH: 29.7 pg (ref 26.0–34.0)
MCHC: 32.3 g/dL (ref 30.0–36.0)
MCV: 91.8 fL (ref 78.0–100.0)
PLATELETS: 370 10*3/uL (ref 150–400)
RBC: 5.15 MIL/uL — ABNORMAL HIGH (ref 3.87–5.11)
RDW: 12.8 % (ref 11.5–15.5)
WBC: 9.1 10*3/uL (ref 4.0–10.5)

## 2017-10-26 LAB — BASIC METABOLIC PANEL
Anion gap: 11 (ref 5–15)
BUN: 10 mg/dL (ref 6–20)
CO2: 24 mmol/L (ref 22–32)
Calcium: 9.5 mg/dL (ref 8.9–10.3)
Chloride: 108 mmol/L (ref 101–111)
Creatinine, Ser: 0.91 mg/dL (ref 0.44–1.00)
GFR calc Af Amer: >60 mL/min (ref >60)
GLUCOSE: 118 mg/dL — AB (ref 65–99)
POTASSIUM: 4.2 mmol/L (ref 3.5–5.1)
Sodium: 143 mmol/L (ref 135–145)

## 2017-10-26 LAB — TSH: TSH: 1.911 u[IU]/mL (ref 0.350–4.500)

## 2017-10-26 LAB — I-STAT TROPONIN, ED: TROPONIN I, POC: 0 ng/mL (ref 0.00–0.08)

## 2017-10-26 LAB — MAGNESIUM: MAGNESIUM: 2.1 mg/dL (ref 1.7–2.4)

## 2017-10-26 MED ORDER — APIXABAN 5 MG PO TABS: 5.0000 mg | ORAL_TABLET | Freq: Two times a day (BID) | ORAL | 0 refills | Status: DC

## 2017-10-26 NOTE — ED Notes (Signed)
Patient transported to X-ray 

## 2017-10-26 NOTE — Patient Instructions (Signed)
Go to [facility] hospital ER and I will call them to let them know you are coming   I will call to arrange ENT referral for next week

## 2017-10-26 NOTE — Assessment & Plan Note (Signed)
Pt not on saba or laba x weeks and RAF on arrival with BP in 90s's so no good options to treat from this office >  Relatively asympt ? Duration of afib so rec self transport to er >   triage nurse notified

## 2017-10-26 NOTE — ED Notes (Signed)
Patient verbalizes understanding of discharge instructions. Opportunity for questioning and answers were provided. Armband removed by staff, pt discharged from ED ambulatory.   

## 2017-10-26 NOTE — ED Triage Notes (Signed)
Patient presents to the ED from [facility]. Patient states went to see about a cough when they did a EKG HR 144. Patient denies any Chest pain Shortness of breathe. Patient alert and oriented x4.

## 2017-10-26 NOTE — ED Provider Notes (Signed)
[facility] EMERGENCY DEPARTMENT Provider Note   CSN: [id number] Arrival date & time: 10/26/17  1159     History   Chief Complaint Chief Complaint  Patient presents with  . Chest Pain    HPI Leah Reeves is a 67 y.o. female.  HPI Patient sent from pulmonary office with atrial fibrillation with RVR.  Was there for a persistent cough.  Upon checking vitals they found that she had heart rate 140.  States then she began to feel that her heart was going fast but had not really been feeling before.  Did have an episode yesterday which felt like a panic attack after she got some bad news yesterday.  No history of atrial fibrillation.  Has had a cough without sputum production.  No chest pain.  No swelling in her legs.  No cardiac history.  Upon arrival to the ER here she is back in a sinus rhythm. Past Medical History:  Diagnosis Date  . Anxiety   . Arthritis    knees -generalized  . Depression   . Headache    past history - none at present  . Heart murmur   . Osteopenia   . Perimenopausal vasomotor symptoms   . Vertigo    tx. 6 months ago- no problems now,very mild occ.    Patient Active Problem List   Diagnosis Date Noted  . Palpitations 10/26/2017  . Cough variant asthma vs uacs  09/09/2017  . PULMONARY INFILTRATE INCLUDES (EOSINOPHILIA) 05/04/2008    Past Surgical History:  Procedure Laterality Date  . BREAST CYST ASPIRATION Right 2008  . COLONOSCOPY WITH PROPOFOL N/A 09/18/2014   Procedure: COLONOSCOPY WITH PROPOFOL;  Surgeon: Garlan Fair, MD;  Location: [facility] ENDOSCOPY;  Service: Endoscopy;  Laterality: N/A;     OB History    Gravida  0   Para      Term      Preterm      AB      Living        SAB      TAB      Ectopic      Multiple      Live Births               Home Medications    Prior to Admission medications   Medication Sig Start Date End Date Taking? Authorizing Provider  ALPRAZolam (XANAX) 0.25 MG tablet  Take 0.25 mg by mouth 2 (two) times daily as needed for anxiety.   Yes [provider]  amoxicillin-clavulanate (AUGMENTIN) 875-125 MG tablet Take 1 tablet by mouth 2 (two) times daily. 10/06/17  Yes Tanda Rockers, MD  buPROPion (WELLBUTRIN XL) 150 MG 24 hr tablet Take 150 mg by mouth every morning.   Yes [provider]  cholecalciferol (VITAMIN D) 1000 units tablet Take 2,000 Units by mouth daily.   Yes [provider]  famotidine (PEPCID) 20 MG tablet One at bedtime 09/09/17  Yes Tanda Rockers, MD  FLUoxetine (PROZAC) 20 MG tablet Take 40 mg by mouth every morning.    Yes [provider]  montelukast (SINGULAIR) 10 MG tablet Take 1 tablet (10 mg total) by mouth at bedtime. 09/28/17  Yes Tanda Rockers, MD  simvastatin (ZOCOR) 40 MG tablet Take 40 mg by mouth every morning.   Yes [provider]  apixaban (ELIQUIS) 5 MG TABS tablet Take 1 tablet (5 mg total) by mouth 2 (two) times daily. 10/26/17   Alvino Chapel,  Ovid Curd, MD    Family History Family History  Problem Relation Age of Onset  . Diabetes Mother   . Hypertension Mother   . Heart disease Father   . Diabetes Sister   . Heart disease Sister     Social History Social History   Tobacco Use  . Smoking status: Former Smoker    Packs/day: 0.50    Years: 3.00    Pack years: 1.50    Types: Cigarettes    Last attempt to quit: 05/26/1991    Years since quitting: 26.4  . Smokeless tobacco: Never Used  Substance Use Topics  . Alcohol use: Yes    Comment: wine occassionally  . Drug use: No     Allergies   Codeine and Azithromycin   Review of Systems Review of Systems  Constitutional: Negative for appetite change and fever.  HENT: Negative for congestion.   Respiratory: Positive for cough.   Cardiovascular: Positive for palpitations.  Gastrointestinal: Negative for abdominal pain.  Genitourinary: Negative for flank pain.  Musculoskeletal: Negative for back pain.  Skin: Negative  for rash.  Neurological: Negative for weakness.  Hematological: Negative for adenopathy.  Psychiatric/Behavioral: Negative for confusion.     Physical Exam Updated Vital Signs BP (!) 113/54   Pulse 69   Temp 98.4 F (36.9 C) (Oral)   Resp 14   Ht 5\' 4"  (1.626 m)   Wt 65.3 kg (144 lb)   LMP 05/25/2008   SpO2 98%   BMI 24.72 kg/m   Physical Exam  Constitutional: She appears well-developed.  HENT:  Head: Atraumatic.  Eyes: EOM are normal.  Neck: Neck supple.  Cardiovascular: Normal rate, regular rhythm and normal pulses.  Pulmonary/Chest: No stridor. No tachypnea. No respiratory distress. She has no wheezes.  Abdominal: Soft. There is no tenderness.  Musculoskeletal:       Right lower leg: She exhibits no edema.       Left lower leg: She exhibits no edema.  Neurological: She is alert.  Skin: Skin is warm. Capillary refill takes less than 2 seconds.     ED Treatments / Results  Labs (all labs ordered are listed, but only abnormal results are displayed) Labs Reviewed  BASIC METABOLIC PANEL - Abnormal; Notable for the following components:      Result Value   Glucose, Bld 118 (*)    All other components within normal limits  CBC - Abnormal; Notable for the following components:   RBC 5.15 (*)    Hemoglobin 15.3 (*)    HCT 47.3 (*)    All other components within normal limits  TSH  MAGNESIUM  I-STAT TROPONIN, ED    EKG EKG Interpretation  Date/Time:  Tuesday October 26 2017 12:04:44 EDT Ventricular Rate:  96 PR Interval:    QRS Duration: 74 QT Interval:  344 QTC Calculation: 434 R Axis:   48 Text Interpretation:  Sinus rhythm Nonspecific ST abnormality Abnormal ECG Confirmed by Davonna Belling [phone]) on 10/26/2017 12:35:53 PM   Radiology Dg Chest 2 View  Result Date: 10/26/2017 CLINICAL DATA:  Acute onset increased heart rate today. EXAM: CHEST - 2 VIEW COMPARISON:  PA and lateral chest 07/15/2017.  CT chest 10/04/2008. FINDINGS: The lungs are clear.  Heart size is normal. No pneumothorax or pleural effusion. No acute or focal bony abnormality. IMPRESSION: Negative chest. Electronically Signed   By: Inge Rise M.D.   On: 10/26/2017 13:21    Procedures Procedures (including critical care time)  Medications Ordered in ED  Medications - No data to display   Initial Impression / Assessment and Plan / ED Course  I have reviewed the triage vital signs and the nursing notes.  Pertinent labs & imaging results that were available during my care of the patient were reviewed by me and considered in my medical decision making (see chart for details).     Patient with A. fib RVR.  Resolved upon arrival.  ChadsVASC score of 2 for age and female.  Otherwise healthy.  Will start on anticoagulation and have follow-up with A. fib clinic.  Final Clinical Impressions(s) / ED Diagnoses   Final diagnoses:  Paroxysmal atrial fibrillation [facility])    ED Discharge Orders        Ordered    apixaban (ELIQUIS) 5 MG TABS tablet  2 times daily     10/26/17 1400       Davonna Belling, MD 10/26/17 1411

## 2017-10-26 NOTE — Assessment & Plan Note (Signed)
Spirometry  07/26/17  wnl  Allergy profile 09/09/2017 >  Eos 0.4 /  IgE  29 Rast Pos cat > dog - FENO 09/09/2017  =   36  - 09/09/2017  After extensive coaching inhaler device  effectiveness =    75% try stop BREO and start symb 80 2bid - trial of singulair 09/28/2017  - 09/28/2017  After extensive coaching inhaler device  effectiveness =    90%  - Sinus CT 10/05/2017 >>>  Fluid levels in the maxillary sinuses bilaterally compatible with acute sinusitis >  augmentin 875 bid x 20 days then ov   Clearly still has active sinusitis so rather than repeat sinus CT rec ENT eval next   Avoid further empirical saba/laba here given new AF dx today  Discussed in detail all the  indications, usual  risks and alternatives  relative to the benefits with patient who agrees to proceed with w/u as outlined.     I had an extended discussion with the patient reviewing all relevant studies completed to date and  lasting 15 to 20 minutes of a 25 minute visit    Each maintenance medication was reviewed in detail including most importantly the difference between maintenance and prns and under what circumstances the prns are to be triggered using an action plan format that is not reflected in the computer generated alphabetically organized AVS.    Please see AVS for specific instructions unique to this visit that I personally wrote and verbalized to the the pt in detail and then reviewed with pt  by my nurse highlighting any  changes in therapy recommended at today's visit to their plan of care.

## 2017-10-26 NOTE — Progress Notes (Signed)
Subjective:     Patient ID: Leah Reeves, female   DOB: 04-03-1951      MRN: [id number]    Brief patient profile:  76 yowf quit smoking in 1993 with no chronic or recurrent resp problems then indolent onset of persistent cough  the week after xmas 2018 s acute antecedent uri pattern > Dr Clayton Bibles eval in Jan 2019 rx pred > cxr 06/2017 was fine rx amox >  Then 2 inhalers and shot of prednisone > no better the breo and so referred to pulmonary clinic 09/09/2017 by Dr   Fara Olden  Note prev seen in pulmonary clinic 05/08/2008 dx cough p uri > resolved on gerd rx    History of Present Illness  09/09/2017 1st [facility] office visit/ Wert   Chief Complaint  Patient presents with  . Pulmonary Consult    Referred by [name]. Pt c/o cough since end of Dec 2018. Cough is prod with thick, clear to "cloudy" sputum.  It does not bother her at night.   most productive after 2pm up till 8 pm and never noct never purulent just  thick white mucus No better on breo  rec Stop breo and start symbicort 80 Take 2 puffs first thing in am and then another 2 puffs about 12 hours later.  Work on inhaler technique:   Pantoprazole (protonix) 40 mg   Take  30-60 min before first meal of the day and Pepcid (famotidine)  20 mg one @  bedtime until return to office - this is the best way to tell whether stomach acid is contributing to your problem.   GERD We will call next week to arrange sinus CT if not better by then       09/28/2017  f/u ov/Wert re: cough variant asthma vs uacs Chief Complaint  Patient presents with  . Follow-up    Cough has improved some, but not completely resolved. She is coughing up some clear sputum.   Dyspnea:  Not limited by breathing from desired activities   Cough: still worse in afternoons  Sleep: fine  SABA use:  None, no better in past with albuterol  rec Add singulair 10 mg one daily x  two weeks  then stop symbicort  schedule    Sinus CT 10/05/2017 >>>  Fluid levels in  the maxillary sinuses bilaterally compatible with acute sinusitis >  augmentin 875 bid x 20 days then ov     10/26/2017  f/u ov/Wert re: cough since xmas 2018  Chief Complaint  Patient presents with  . Follow-up    Cough improved until she ran out of symbicort 4 days ago. She is coughing up some clear to grey sputum.   Dyspnea:  Not limited and able to ex even at gym no problem Cough: daytime was better on combination of singulair/ symb 80 and worse since stopped symbicort and protonix about the same time  Sleep: noct cough  better since started augmentin.   No obvious day to day or daytime variability or assoc   mucus plugs or hemoptysis or cp or chest tightness, subjective wheeze or overt   hb symptoms. No unusual exposure hx or h/o childhood pna/ asthma or knowledge of premature birth.  Sleeping   Better   without nocturnal  or early am exacerbation  of respiratory  c/o's or need for noct saba. Also denies any obvious fluctuation of symptoms with weather or environmental changes or other aggravating or alleviating factors except as outlined above  Current Allergies, Complete Past Medical History, Past Surgical History, Family History, and Social History were reviewed in [employer] record.  ROS  The following are not active complaints unless bolded Hoarseness, sore throat, dysphagia, dental problems, itching, sneezing,  nasal congestion or discharge of excess mucus or purulent secretions, ear ache,   fever, chills, sweats, unintended wt loss or wt gain, classically pleuritic or exertional cp,  orthopnea pnd or arm/hand swelling  or leg swelling, presyncope, palpitations, abdominal pain, anorexia, nausea, vomiting, diarrhea  or change in bowel habits or change in bladder habits, change in stools or change in urine, dysuria, hematuria,  rash, arthralgias, visual complaints, headache, numbness, weakness or ataxia or problems with walking or coordination,  change in mood or   memory.        Current Meds  Medication Sig  . ALPRAZolam (XANAX) 0.25 MG tablet Take 0.25 mg by mouth 2 (two) times daily as needed for anxiety.  Marland Kitchen amoxicillin-clavulanate (AUGMENTIN) 875-125 MG tablet Take 1 tablet by mouth 2 (two) times daily.  Marland Kitchen aspirin EC 81 MG tablet Take 81 mg by mouth daily.  Marland Kitchen buPROPion (WELLBUTRIN XL) 150 MG 24 hr tablet Take 150 mg by mouth every morning.  . cholecalciferol (VITAMIN D) 1000 units tablet Take 2,000 Units by mouth daily.  . famotidine (PEPCID) 20 MG tablet One at bedtime  . FLUoxetine (PROZAC) 20 MG tablet Take 40 mg by mouth every morning.   . montelukast (SINGULAIR) 10 MG tablet Take 1 tablet (10 mg total) by mouth at bedtime.  . simvastatin (ZOCOR) 40 MG tablet Take 40 mg by mouth every morning.  . [DISCONTINUED] estradiol (ESTRACE) 0.1 MG/GM vaginal cream APPLY 1/4 APPLICATORFUL TWICE WEEKLY AS DIRECTED                         Objective:   Physical Exam  amb wf nasal tone to voice    10/26/2017          144  09/28/2017            148   09/09/17 149 lb 6.4 oz (67.8 kg)  08/11/17 148 lb 14.4 oz (67.5 kg)  08/03/16 155 lb 11.2 oz (70.6 kg)      Vital signs reviewed - Note on arrival 02 sats  99% on RA with bp 96/60 and pulse 140      HEENT: nl dentition,  and oropharynx. Nl external ear canals without cough reflex - mild  bilateral non-specific turbinate edema     NECK :  without JVD/Nodes/TM/ nl carotid upstrokes bilaterally   LUNGS: no acc muscle use,  Nl contour chest  Minimal insp /exp rhonchi bilaterally without cough on insp or exp maneuvers   CV:  IRIR @ 130-140   no s3 or murmur or increase in P2, and no edema   ABD:  soft and nontender with nl inspiratory excursion in the supine position. No bruits or organomegaly appreciated, bowel sounds nl  MS:  Nl gait/ ext warm without deformities, calf tenderness, cyanosis or clubbing No obvious joint restrictions   SKIN: warm and dry without lesions    NEURO:   alert, approp, nl sensorium with  no motor or cerebellar deficits apparent.         EKG 10/26/2017   AFib with rate 131 no ischemic changes     Assessment:

## 2017-10-27 ENCOUNTER — Telehealth: Payer: Self-pay | Admitting: Internal Medicine

## 2017-10-27 NOTE — Telephone Encounter (Signed)
Spoke with pt. States the she went to the hospital after her appointment yesterday she went to the ED. Pt was diagnosed with A Fib and was started on Eliquis. Pt wanted MW to know how thankful she is that he was so attentive and took such good care of her. Will route to MW to make him aware.

## 2017-11-03 ENCOUNTER — Telehealth: Payer: Self-pay | Admitting: Internal Medicine

## 2017-11-03 DIAGNOSIS — J45991 Cough variant asthma: Secondary | ICD-10-CM

## 2017-11-03 NOTE — Telephone Encounter (Signed)
Called to advise patient that we have ordered the referral for ENT and she should be hearing from someone soon.

## 2017-11-04 ENCOUNTER — Ambulatory Visit (HOSPITAL_COMMUNITY)
Admission: RE | Admit: 2017-11-04 | Discharge: 2017-11-04 | Disposition: A | Discharge status: Home / Self Care | Payer: Medicare Other | Source: Ambulatory Visit | Attending: Nurse Practitioner | Admitting: Nurse Practitioner

## 2017-11-04 ENCOUNTER — Encounter (HOSPITAL_COMMUNITY): Payer: Self-pay | Admitting: Nurse Practitioner

## 2017-11-04 VITALS — BP 156/74 | HR 68 | Ht 64.0 in | Wt 146.8 lb

## 2017-11-04 DIAGNOSIS — M8580 Other specified disorders of bone density and structure, unspecified site: Secondary | ICD-10-CM | POA: Diagnosis not present

## 2017-11-04 DIAGNOSIS — R05 Cough: Secondary | ICD-10-CM | POA: Diagnosis not present

## 2017-11-04 DIAGNOSIS — R0683 Snoring: Secondary | ICD-10-CM | POA: Insufficient documentation

## 2017-11-04 DIAGNOSIS — Z79899 Other long term (current) drug therapy: Secondary | ICD-10-CM | POA: Insufficient documentation

## 2017-11-04 DIAGNOSIS — F410 Panic disorder [episodic paroxysmal anxiety] without agoraphobia: Secondary | ICD-10-CM | POA: Insufficient documentation

## 2017-11-04 DIAGNOSIS — F329 Major depressive disorder, single episode, unspecified: Secondary | ICD-10-CM | POA: Diagnosis not present

## 2017-11-04 DIAGNOSIS — Z7901 Long term (current) use of anticoagulants: Secondary | ICD-10-CM | POA: Diagnosis not present

## 2017-11-04 DIAGNOSIS — Z881 Allergy status to other antibiotic agents status: Secondary | ICD-10-CM | POA: Diagnosis not present

## 2017-11-04 DIAGNOSIS — I480 Paroxysmal atrial fibrillation: Secondary | ICD-10-CM | POA: Insufficient documentation

## 2017-11-04 DIAGNOSIS — Z8249 Family history of ischemic heart disease and other diseases of the circulatory system: Secondary | ICD-10-CM | POA: Diagnosis not present

## 2017-11-04 DIAGNOSIS — Z885 Allergy status to narcotic agent status: Secondary | ICD-10-CM | POA: Insufficient documentation

## 2017-11-04 DIAGNOSIS — Z833 Family history of diabetes mellitus: Secondary | ICD-10-CM | POA: Insufficient documentation

## 2017-11-04 DIAGNOSIS — Z9889 Other specified postprocedural states: Secondary | ICD-10-CM | POA: Insufficient documentation

## 2017-11-04 DIAGNOSIS — Z87891 Personal history of nicotine dependence: Secondary | ICD-10-CM | POA: Insufficient documentation

## 2017-11-04 MED ORDER — APIXABAN 5 MG PO TABS: 5.0000 mg | ORAL_TABLET | Freq: Two times a day (BID) | ORAL | 3 refills | Status: DC

## 2017-11-04 NOTE — Progress Notes (Signed)
Electrophysiology Office Note   Date:  11/04/2017   ID:  [name], DOB Jul 29, 1950, MRN [id number]  PCP:  Leeroy Cha, MD  Cardiologist:   Primary Electrophysiologist:  Alicya Bena Meredith Leeds, MD    No chief complaint on file.    History of Present Illness: Leah Reeves is a 67 y.o. female who is being seen today for the evaluation of atrial fibrillation at the request of Kasson. Presenting today for electrophysiology evaluation. she presented to the emergency room on 10/26/2017 with atrial fibrillation and rapid rates.  She was here for persistent cough at the time.  Her heart rate was 140 at the time.  She had an episode the day prior that folic a panic attack when she got bad news.  She has no history of atrial fibrillation.  Upon arrival to the emergency room, she was in sinus rhythm.  Coffee to drink and thinks that this may have been her trigger.  She did not have any symptoms due to her atrial fibrillation.  No weakness fatigue or shortness of breath.    Today, she denies that day, she had had symptoms of palpitations, chest pain, shortness of breath, orthopnea, PND, lower extremity edema, claudication, dizziness, presyncope, syncope, bleeding, or neurologic sequela. The patient is tolerating medications without difficulties.    Past Medical History:  Diagnosis Date  . Anxiety   . Arthritis    knees -generalized  . Depression   . Headache    past history - none at present  . Heart murmur   . Osteopenia   . Perimenopausal vasomotor symptoms   . Vertigo    tx. 6 months ago- no problems now,very mild occ.   Past Surgical History:  Procedure Laterality Date  . BREAST CYST ASPIRATION Right 2008  . COLONOSCOPY WITH PROPOFOL N/A 09/18/2014   Procedure: COLONOSCOPY WITH PROPOFOL;  Surgeon: Garlan Fair, MD;  Location: [facility];  Service: Endoscopy;  Laterality: N/A;     Current Outpatient Medications  Medication Sig Dispense Refill  .  ALPRAZolam (XANAX) 0.25 MG tablet Take 0.25 mg by mouth 2 (two) times daily as needed for anxiety.    Marland Kitchen apixaban (ELIQUIS) 5 MG TABS tablet Take 1 tablet (5 mg total) by mouth 2 (two) times daily. 60 tablet 0  . buPROPion (WELLBUTRIN XL) 150 MG 24 hr tablet Take 150 mg by mouth every morning.    . cholecalciferol (VITAMIN D) 1000 units tablet Take 2,000 Units by mouth daily.    . famotidine (PEPCID) 20 MG tablet One at bedtime 30 tablet 11  . FLUoxetine (PROZAC) 20 MG tablet Take 40 mg by mouth every morning.     . montelukast (SINGULAIR) 10 MG tablet Take 1 tablet (10 mg total) by mouth at bedtime. 30 tablet 11  . simvastatin (ZOCOR) 40 MG tablet Take 40 mg by mouth every morning.     No current facility-administered medications for this encounter.     Allergies:   Codeine and Azithromycin   Social History:  The patient  reports that she quit smoking about 26 years ago. Her smoking use included cigarettes. She has a 1.50 pack-year smoking history. She has never used smokeless tobacco. She reports that she drinks alcohol. She reports that she does not use drugs.   Family History:  The patient's family history includes Diabetes in her mother and sister; Heart disease in her father and sister; Hypertension in her mother.    ROS:  Please see the history  of present illness.   Otherwise, review of systems is positive for none.   All other systems are reviewed and negative.    PHYSICAL EXAM: VS:  BP (!) 156/74 (BP Location: Right Arm, Patient Position: Sitting, Cuff Size: Normal)   Pulse 68   Ht 5\' 4"  (1.626 m)   Wt 146 lb 12.8 oz (66.6 kg)   LMP 05/25/2008   BMI 25.20 kg/m  , BMI Body mass index is 25.2 kg/m. GEN: Well nourished, well developed, in no acute distress  HEENT: normal  Neck: no JVD, carotid bruits, or masses Cardiac: RRR; no murmurs, rubs, or gallops,no edema  Respiratory:  clear to auscultation bilaterally, normal work of breathing GI: soft, nontender, nondistended, +  BS MS: no deformity or atrophy  Skin: warm and dry Neuro:  Strength and sensation are intact Psych: euthymic mood, full affect  EKG:  EKG is ordered today. Personal review of the ekg ordered shows SR, rate 66  Recent Labs: 10/26/2017: BUN 10; Creatinine, Ser 0.91; Hemoglobin 15.3; Magnesium 2.1; Platelets 370; Potassium 4.2; Sodium 143; TSH 1.911    Lipid Panel     Component Value Date/Time   CHOL 170 11/09/2011 0952   HDL 43 11/09/2011 0952     Wt Readings from Last 3 Encounters:  11/04/17 146 lb 12.8 oz (66.6 kg)  10/26/17 144 lb (65.3 kg)  10/26/17 144 lb 12.8 oz (65.7 kg)      Other studies Reviewed: Additional studies/ records that were reviewed today include: epic notes    ASSESSMENT AND PLAN:  1.  Paroxysmal atrial fibrillation: Currently on Eliquis for anticoagulation.  She is in sinus rhythm today.  It is unclear as to the cause of her atrial fibrillation.  Chessa Barrasso order an echo today to see if there is any reversible cause.  She has had a TSH which has been normal.  We Dameir Gentzler continue her Eliquis.  She does have a elevated stroke risk, but she does get a point for being a female.  If her atrial fibrillation burden is low, she may be able to come off of her anticoagulation.  I have asked her to pay attention to her symptoms and see if she has weakness, fatigue or shortness of breath.  I have also shown her to take her pulse to see if she has atrial fibrillation at random times.  Hopefully we Mariyanna Mucha be able to get somewhat of a burden of her atrial fibrillation.  I did discuss with her the benefit of heart healthy diet and exercise.  We also discussed alcohol, but she says she does not drink.  This patients CHA2DS2-VASc Score and unadjusted Ischemic Stroke Rate (% per year) is equal to 2.2 % stroke rate/year from a score of 2  Above score calculated as 1 point each if present [CHF, HTN, DM, Vascular=MI/PAD/Aortic Plaque, Age if 65-74, or Female] Above score calculated as 2  points each if present [Age > 75, or Stroke/TIA/TE]  2.  Snoring: I have asked her to have her husband check to see if she stops breathing when she sleeps.  She may warrant sleep study in the future.  Current medicines are reviewed at length with the patient today.   The patient does not have concerns regarding her medicines.  The following changes were made today:  none  Labs/ tests ordered today include:  Orders Placed This Encounter  Procedures  . EKG 12-Lead     Disposition:   FU with AF clinic 1 month  Signed,  Takira Sherrin Meredith Leeds, MD  11/04/2017 3:09 PM     [address] [place] [address] [phone] (office) [phone] (fax)

## 2017-11-05 ENCOUNTER — Ambulatory Visit (HOSPITAL_COMMUNITY)
Admission: RE | Admit: 2017-11-05 | Discharge: 2017-11-05 | Disposition: A | Discharge status: Home / Self Care | Payer: Medicare Other | Source: Ambulatory Visit | Attending: Cardiology | Admitting: Cardiology

## 2017-11-05 DIAGNOSIS — I4891 Unspecified atrial fibrillation: Secondary | ICD-10-CM | POA: Diagnosis present

## 2017-11-05 DIAGNOSIS — R011 Cardiac murmur, unspecified: Secondary | ICD-10-CM | POA: Insufficient documentation

## 2017-11-05 DIAGNOSIS — I480 Paroxysmal atrial fibrillation: Secondary | ICD-10-CM | POA: Insufficient documentation

## 2017-11-05 DIAGNOSIS — I5189 Other ill-defined heart diseases: Secondary | ICD-10-CM | POA: Insufficient documentation

## 2017-11-05 DIAGNOSIS — I313 Pericardial effusion (noninflammatory): Secondary | ICD-10-CM | POA: Insufficient documentation

## 2017-11-05 DIAGNOSIS — I517 Cardiomegaly: Secondary | ICD-10-CM | POA: Insufficient documentation

## 2017-11-05 NOTE — Progress Notes (Signed)
  Echocardiogram 2D Echocardiogram has been performed.  Leah Reeves 11/05/2017, 11:22 AM

## 2017-11-08 ENCOUNTER — Encounter (HOSPITAL_COMMUNITY): Payer: Self-pay | Admitting: *Deleted

## 2017-11-18 ENCOUNTER — Ambulatory Visit
Admission: RE | Admit: 2017-11-18 | Discharge: 2017-11-18 | Disposition: A | Discharge status: Home / Self Care | Payer: Medicare Other | Source: Ambulatory Visit | Attending: Obstetrics & Gynecology | Admitting: Obstetrics & Gynecology

## 2017-11-18 ENCOUNTER — Ambulatory Visit: Payer: Medicare Other

## 2017-11-18 DIAGNOSIS — Z1231 Encounter for screening mammogram for malignant neoplasm of breast: Secondary | ICD-10-CM

## 2017-12-01 ENCOUNTER — Other Ambulatory Visit: Payer: Self-pay | Admitting: Internal Medicine

## 2017-12-03 ENCOUNTER — Ambulatory Visit (HOSPITAL_COMMUNITY)
Admission: RE | Admit: 2017-12-03 | Discharge: 2017-12-03 | Disposition: A | Discharge status: Home / Self Care | Payer: Medicare Other | Source: Ambulatory Visit | Attending: Nurse Practitioner | Admitting: Nurse Practitioner

## 2017-12-03 ENCOUNTER — Encounter (HOSPITAL_COMMUNITY): Payer: Self-pay | Admitting: Nurse Practitioner

## 2017-12-03 VITALS — BP 116/72 | HR 67 | Ht 64.0 in | Wt 146.0 lb

## 2017-12-03 DIAGNOSIS — Z885 Allergy status to narcotic agent status: Secondary | ICD-10-CM | POA: Insufficient documentation

## 2017-12-03 DIAGNOSIS — F329 Major depressive disorder, single episode, unspecified: Secondary | ICD-10-CM | POA: Diagnosis not present

## 2017-12-03 DIAGNOSIS — F419 Anxiety disorder, unspecified: Secondary | ICD-10-CM | POA: Insufficient documentation

## 2017-12-03 DIAGNOSIS — Z7901 Long term (current) use of anticoagulants: Secondary | ICD-10-CM | POA: Diagnosis not present

## 2017-12-03 DIAGNOSIS — Z881 Allergy status to other antibiotic agents status: Secondary | ICD-10-CM | POA: Diagnosis not present

## 2017-12-03 DIAGNOSIS — I480 Paroxysmal atrial fibrillation: Secondary | ICD-10-CM | POA: Diagnosis not present

## 2017-12-03 DIAGNOSIS — Z79899 Other long term (current) drug therapy: Secondary | ICD-10-CM | POA: Diagnosis not present

## 2017-12-03 DIAGNOSIS — Z833 Family history of diabetes mellitus: Secondary | ICD-10-CM | POA: Insufficient documentation

## 2017-12-03 DIAGNOSIS — Z8249 Family history of ischemic heart disease and other diseases of the circulatory system: Secondary | ICD-10-CM | POA: Diagnosis not present

## 2017-12-03 DIAGNOSIS — I4891 Unspecified atrial fibrillation: Secondary | ICD-10-CM | POA: Diagnosis present

## 2017-12-03 DIAGNOSIS — Z87891 Personal history of nicotine dependence: Secondary | ICD-10-CM | POA: Insufficient documentation

## 2017-12-03 NOTE — Progress Notes (Signed)
Electrophysiology Office Note   Date:  12/03/2017   ID:  Leah Reeves, DOB 07/05/1950, MRN [id number]  PCP:  Leeroy Cha, MD  Cardiologist:   Primary Electrophysiologist:  Roderic Palau, NP    Chief Complaint  Patient presents with  . Atrial Fibrillation     History of Present Illness: Leah Reeves is a 67 y.o. female who is was seen initially  for the evaluation of atrial fibrillation at the request of Leah Reeves in the afib clinic by [facility] .  She presented to the emergency room on 10/26/2017 with atrial fibrillation and rapid rates.  She had persistent cough at the time .Her heart rate was 140 bpm at the time.  She had an episode the day prior that prompted  a panic attack when she got bad news.  She has no history of atrial fibrillation.  Upon arrival to the emergency room, she was in sinus rhythm.  Had coffee to drink and thinks  this may have been her trigger.  She did not have any symptoms due to her atrial fibrillation.  No weakness fatigue or shortness of breath.  She is in afib clinic for f/u 7/12.  She has not had any more episodes of afib. She has frequent PVC's on EKG. She states that she has had PVC's since being a young woman. She had an echo that showed mod diastolic dysfunction. Her husband reports that she snores heavily but he does not really notice apnea, but he wears CPAP as well. She has has a normal TSH.  Today, she denies that day, she had had symptoms of palpitations, chest pain, shortness of breath, orthopnea, PND, lower extremity edema, claudication, dizziness, presyncope, syncope, bleeding, or neurologic sequela. The patient is tolerating medications without difficulties.    Past Medical History:  Diagnosis Date  . Anxiety   . Arthritis    knees -generalized  . Depression   . Headache    past history - none at present  . Heart murmur   . Osteopenia   . Perimenopausal vasomotor symptoms   . Vertigo    tx. 6 months ago- no  problems now,very mild occ.   Past Surgical History:  Procedure Laterality Date  . BREAST BIOPSY    . BREAST CYST ASPIRATION Right 2008  . COLONOSCOPY WITH PROPOFOL N/A 09/18/2014   Procedure: COLONOSCOPY WITH PROPOFOL;  Surgeon: Garlan Fair, MD;  Location: [facility];  Service: Endoscopy;  Laterality: N/A;     Current Outpatient Medications  Medication Sig Dispense Refill  . ALPRAZolam (XANAX) 0.25 MG tablet Take 0.25 mg by mouth 2 (two) times daily as needed for anxiety.    Marland Kitchen apixaban (ELIQUIS) 5 MG TABS tablet Take 1 tablet (5 mg total) by mouth 2 (two) times daily. 60 tablet 3  . buPROPion (WELLBUTRIN XL) 150 MG 24 hr tablet Take 150 mg by mouth every morning.    . cholecalciferol (VITAMIN D) 1000 units tablet Take 2,000 Units by mouth daily.    . famotidine (PEPCID) 20 MG tablet One at bedtime 30 tablet 11  . FLUoxetine (PROZAC) 20 MG tablet Take 40 mg by mouth every morning.     Marland Kitchen levofloxacin (LEVAQUIN) 500 MG tablet Take 1 tablet by mouth.    . montelukast (SINGULAIR) 10 MG tablet Take 1 tablet (10 mg total) by mouth at bedtime. 30 tablet 11  . pantoprazole (PROTONIX) 40 MG tablet TAKE 1 TABLET (40 MG TOTAL) BY MOUTH DAILY. TAKE 30-60 MIN BEFORE FIRST  MEAL OF THE DAY 90 tablet 0  . simvastatin (ZOCOR) 40 MG tablet Take 40 mg by mouth every morning.     No current facility-administered medications for this encounter.     Allergies:   Codeine and Azithromycin   Social History:  The patient  reports that she quit smoking about 26 years ago. Her smoking use included cigarettes. She has a 1.50 pack-year smoking history. She has never used smokeless tobacco. She reports that she drinks alcohol. She reports that she does not use drugs.   Family History:  The patient's family history includes Diabetes in her mother and sister; Heart disease in her father and sister; Hypertension in her mother.    ROS:  Please see the history of present illness.   Otherwise, review of systems  is positive for none.   All other systems are reviewed and negative.    PHYSICAL EXAM: VS:  BP 116/72 (BP Location: Left Arm, Patient Position: Sitting, Cuff Size: Normal)   Pulse 67   Ht 5\' 4"  (1.626 m)   Wt 146 lb (66.2 kg)   LMP 05/25/2008   BMI 25.06 kg/m  , BMI Body mass index is 25.06 kg/m. GEN: Well nourished, well developed, in no acute distress  HEENT: normal  Neck: no JVD, carotid bruits, or masses Cardiac: RRR; no murmurs, rubs, or gallops,no edema  Respiratory:  clear to auscultation bilaterally, normal work of breathing GI: soft, nontender, nondistended, + BS MS: no deformity or atrophy  Skin: warm and dry Neuro:  Strength and sensation are intact Psych: euthymic mood, full affect  EKG:  EKG is ordered today. Personal review of the ekg ordered shows SR, rate 66  Recent Labs: 10/26/2017: BUN 10; Creatinine, Ser 0.91; Hemoglobin 15.3; Magnesium 2.1; Platelets 370; Potassium 4.2; Sodium 143; TSH 1.911    Lipid Panel     Component Value Date/Time   CHOL 170 11/09/2011 0952   HDL 43 11/09/2011 0952     Wt Readings from Last 3 Encounters:  12/03/17 146 lb (66.2 kg)  11/04/17 146 lb 12.8 oz (66.6 kg)  10/26/17 144 lb (65.3 kg)      Other studies Reviewed: Additional studies/ records that were reviewed today include: epic notes    ASSESSMENT AND PLAN:  1.  Paroxysmal atrial fibrillation:  No further issues with afib. With now mod LV dysfunction, she has a CHA2DS2VASc score of 3 (+ female and age) and should  continue on anticoagulation Will order sleep study for snoring, she prefers this to go thru Dr. Nancee Liter office   Will have her f/u with general cardiology, Dr. Marlou Porch in 3 months  Labs/ tests ordered today include:  Orders Placed This Encounter  Procedures  . EKG 12-Lead     Signed, Geroge Baseman. Stanislav Gervase, [facility] [address] [place], [place] [address] [phone]

## 2018-02-23 ENCOUNTER — Ambulatory Visit: Payer: Medicare Other | Admitting: Cardiology

## 2018-02-23 ENCOUNTER — Encounter: Payer: Self-pay | Admitting: Cardiology

## 2018-02-23 VITALS — BP 160/84 | HR 64 | Ht 64.0 in | Wt 149.0 lb

## 2018-02-23 DIAGNOSIS — Z7901 Long term (current) use of anticoagulants: Secondary | ICD-10-CM | POA: Insufficient documentation

## 2018-02-23 DIAGNOSIS — E7800 Pure hypercholesterolemia, unspecified: Secondary | ICD-10-CM | POA: Insufficient documentation

## 2018-02-23 DIAGNOSIS — I480 Paroxysmal atrial fibrillation: Secondary | ICD-10-CM | POA: Diagnosis not present

## 2018-02-23 MED ORDER — DILTIAZEM HCL 30 MG PO TABS: 30.0000 mg | ORAL_TABLET | Freq: Four times a day (QID) | ORAL | 1 refills | Status: DC | PRN

## 2018-02-23 MED ORDER — APIXABAN 5 MG PO TABS: 5.0000 mg | ORAL_TABLET | Freq: Two times a day (BID) | ORAL | 3 refills | Status: DC

## 2018-02-23 NOTE — Progress Notes (Signed)
Cardiology Office Note:    Date:  02/23/2018   ID:  Leah Reeves, DOB [birth year], MRN [id number]  PCP:  Leeroy Cha, MD  Cardiologist:  No primary care provider on file.  Electrophysiologist:  None   Referring MD: Leeroy Cha,*     History of Present Illness:    Leah Reeves is a 67 y.o. female here to establish care with history of atrial fibrillation, previously seen by Roderic Palau in atrial fibrillation clinic.  In review of her note, she presented to the emergency room on 10/26/2017 with A. fib RVR, cough, 140 heart rate, panic and after bad news. Inhailers as well. Pulm sent to ER with HR. Dr. Redmond Baseman, cough, sinus .  No prior history of A. fib.  When she got to the ER, her EKG showed sinus rhythm.  She thinks that coffee and anxiety may have triggered this.  No symptoms of weakness shortness of breath fatigue chest pain bleeding.  She has had frequent PVCs on ECG in the past.  She has been told that she had this for quite some time.  Echo showed moderate diastolic dysfunction.  Normal EF.  Husband has noted snoring activity but no noticeable apnea.  He wears a CPAP mask.  Normal TSH for her.  She has been taking Eliquis 5 mg twice a day for her paroxysmal atrial fibrillation episode no bleeding.  No fevers chills nausea vomiting syncope palpitations chest pain claudication dizziness bleeding.  Past Medical History:  Diagnosis Date  . Anxiety   . Arthritis    knees -generalized  . Depression   . Headache    past history - none at present  . Heart murmur   . Osteopenia   . Perimenopausal vasomotor symptoms   . Vertigo    tx. 6 months ago- no problems now,very mild occ.    Past Surgical History:  Procedure Laterality Date  . BREAST BIOPSY    . BREAST CYST ASPIRATION Right 2008  . COLONOSCOPY WITH PROPOFOL N/A 09/18/2014   Procedure: COLONOSCOPY WITH PROPOFOL;  Surgeon: Garlan Fair, MD;  Location: [facility];  Service: Endoscopy;   Laterality: N/A;    Current Medications: Current Meds  Medication Sig  . ALPRAZolam (XANAX) 0.25 MG tablet Take 0.25 mg by mouth 2 (two) times daily as needed for anxiety.  Marland Kitchen apixaban (ELIQUIS) 5 MG TABS tablet Take 1 tablet (5 mg total) by mouth 2 (two) times daily.  Marland Kitchen buPROPion (WELLBUTRIN XL) 150 MG 24 hr tablet Take 150 mg by mouth every morning.  . cholecalciferol (VITAMIN D) 1000 units tablet Take 2,000 Units by mouth daily.  Marland Kitchen FLUoxetine (PROZAC) 20 MG tablet Take 40 mg by mouth every morning.   . simvastatin (ZOCOR) 40 MG tablet Take 40 mg by mouth every morning.  . [DISCONTINUED] apixaban (ELIQUIS) 5 MG TABS tablet Take 1 tablet (5 mg total) by mouth 2 (two) times daily.     Allergies:   Codeine and Azithromycin   Social History   Socioeconomic History  . Marital status: Married    Spouse name: Not on file  . Number of children: Not on file  . Years of education: Not on file  . Highest education level: Not on file  Occupational History  . Not on file  Social Needs  . Financial resource strain: Not on file  . Food insecurity:    Worry: Not on file    Inability: Not on file  . Transportation needs:    Medical:  Not on file    Non-medical: Not on file  Tobacco Use  . Smoking status: Former Smoker    Packs/day: 0.50    Years: 3.00    Pack years: 1.50    Types: Cigarettes    Last attempt to quit: 05/26/1991    Years since quitting: 26.7  . Smokeless tobacco: Never Used  Substance and Sexual Activity  . Alcohol use: Yes    Comment: wine occassionally  . Drug use: No  . Sexual activity: Yes    Partners: Male    Birth control/protection: Post-menopausal    Comment: 1st intercourse- 23, partners- 64, married- 89 yrs   Lifestyle  . Physical activity:    Days per week: Not on file    Minutes per session: Not on file  . Stress: Not on file  Relationships  . Social connections:    Talks on phone: Not on file    Gets together: Not on file    Attends religious  service: Not on file    Active member of club or organization: Not on file    Attends meetings of clubs or organizations: Not on file    Relationship status: Not on file  Other Topics Concern  . Not on file  Social History Narrative  . Not on file     Family History: The patient's family history includes Diabetes in her mother and sister; Heart disease in her father and sister; Hypertension in her mother. Father had implant, pacer. Sister had AFIB. Hospitalized.   ROS:   Please see the history of present illness.     All other systems reviewed and are negative.  EKGs/Labs/Other Studies Reviewed:    The following studies were reviewed today: Prior EKG, echocardiogram, atrial fibrillation clinic note  Echocardiogram 11/05/2017: - Normal LV size with EF 60-65%. Moderate diastolic dysfunction.   Normal RV size and systolic function. No significant valvular   abnormalities.  EKG: EKG today normal sinus rhythm 64 with no other abnormalities.  Personally reviewed and interpreted.  12/03/2017 sinus rhythm rate 66 with no other abnormalities personally reviewed and interpreted.  Recent Labs: 10/26/2017: BUN 10; Creatinine, Ser 0.91; Hemoglobin 15.3; Magnesium 2.1; Platelets 370; Potassium 4.2; Sodium 143; TSH 1.911  Recent Lipid Panel    Component Value Date/Time   CHOL 170 11/09/2011 0952   HDL 43 11/09/2011 0952    Physical Exam:    VS:  BP (!) 160/84   Pulse 64   Ht 5\' 4"  (1.626 m)   Wt 149 lb (67.6 kg)   LMP 05/25/2008   SpO2 98%   BMI 25.58 kg/m     Wt Readings from Last 3 Encounters:  02/23/18 149 lb (67.6 kg)  12/03/17 146 lb (66.2 kg)  11/04/17 146 lb 12.8 oz (66.6 kg)     GEN:  Well nourished, well developed in no acute distress HEENT: Normal NECK: No JVD; No carotid bruits LYMPHATICS: No lymphadenopathy CARDIAC: RRR, no murmurs, rubs, gallops RESPIRATORY:  Clear to auscultation without rales, wheezing or rhonchi  ABDOMEN: Soft, non-tender,  non-distended MUSCULOSKELETAL:  No edema; No deformity  SKIN: Warm and dry NEUROLOGIC:  Alert and oriented x 3 PSYCHIATRIC:  Normal affect   ASSESSMENT:    1. Paroxysmal atrial fibrillation (HCC)   2. Pure hypercholesterolemia   3. Chronic anticoagulation    PLAN:    In order of problems listed above:  Paroxysmal atrial fibrillation -Thankfully, no recurrent episodes.  These seem to be triggered by both anxiety as well  as caffeine. -CHADSVASc score is 3, female, age, diastolic dysfunction - She is obtaining a sleep study through Dr. Patton Salles office.  Excellent. -Asked her to look out for any signs or symptoms.  Check in on her pulse periodically.  She could consider obtaining apple watch in the future. - Hemoglobin 15.3 and creatinine 0.9.  Continue to monitor every 6 months.  Hyperlipidemia -Continue with Zocor 40 mg once a day, no myalgias.  Doing very well.  Her last LDL was 87, HDL 52.  ALT 22  Cough -Has seen Dr. Redmond Baseman.  Pulmonary medicine as well.  Knows to avoid Sudafed.  Elevated blood pressure without hypertension - Normally excellent at home.  Continue to monitor.  Medication Adjustments/Labs and Tests Ordered: Current medicines are reviewed at length with the patient today.  Concerns regarding medicines are outlined above.  Orders Placed This Encounter  Procedures  . EKG 12-Lead   Meds ordered this encounter  Medications  . apixaban (ELIQUIS) 5 MG TABS tablet    Sig: Take 1 tablet (5 mg total) by mouth 2 (two) times daily.    Dispense:  180 tablet    Refill:  3  . diltiazem (CARDIZEM) 30 MG tablet    Sig: Take 1 tablet (30 mg total) by mouth 4 (four) times daily as needed.    Dispense:  30 tablet    Refill:  1    Patient Instructions  Medication Instructions:  You may take Diltiazem 30 mg once every 6 hours as needed for Atrial Fibrillation. Please take Eliquis 5 mg twice daily, Continue all other medications as listed.  Follow-Up: Follow up in 6  months with Cecilie Kicks, NP.  You will receive a letter in the mail 2 months before you are due.  Please call us when you receive this letter to schedule your follow up appointment.  Follow up in 1 year with Dr. Marlou Porch.  You will receive a letter in the mail 2 months before you are due.  Please call us when you receive this letter to schedule your follow up appointment.  If you need a refill on your cardiac medications before your next appointment, please call your pharmacy.  Thank you for choosing [facility]!!        Signed, Candee Furbish, MD  02/23/2018 9:57 AM    [facility]

## 2018-02-23 NOTE — Patient Instructions (Signed)
Medication Instructions:  You may take Diltiazem 30 mg once every 6 hours as needed for Atrial Fibrillation. Please take Eliquis 5 mg twice daily, Continue all other medications as listed.  Follow-Up: Follow up in 6 months with Cecilie Kicks, NP.  You will receive a letter in the mail 2 months before you are due.  Please call us when you receive this letter to schedule your follow up appointment.  Follow up in 1 year with Dr. Marlou Porch.  You will receive a letter in the mail 2 months before you are due.  Please call us when you receive this letter to schedule your follow up appointment.  If you need a refill on your cardiac medications before your next appointment, please call your pharmacy.  Thank you for choosing [facility]!!

## 2018-03-26 ENCOUNTER — Other Ambulatory Visit (HOSPITAL_COMMUNITY): Payer: Self-pay | Admitting: Cardiology

## 2018-03-28 NOTE — Telephone Encounter (Signed)
Eliquis 5mg  refill request received; pt is 67 yrs old, wt-67.6kg, Crea-0.91 on 10/26/17, last seen by Dr. Marlou Porch on 02/23/18; will send in refill to requested pharmacy. Called pt because had a refill sent on 02/23/18 to Mail Order and after speaking with the pt she states that mail order was too expensive, therefore, she needs it sent to to local pharmacy. Will send as requested. She prefers a 30 day supply with refills and not a 90 day supply.

## 2018-06-22 ENCOUNTER — Telehealth: Payer: Self-pay | Admitting: Cardiology

## 2018-06-22 NOTE — Telephone Encounter (Signed)
New Message            [facility] Pre-operative Risk Assessment    Request for surgical clearance:  1. What type of surgery is being performed? Sinus surgery  2. When is this surgery scheduled? Pending  3. What type of clearance is required (medical clearance vs. Pharmacy clearance to hold med vs. Both)? Pharmacy  4. Are there any medications that need to be held prior to surgery and how long? Eliquis  5. Practice name and name of physician performing surgery? [facility] Dr. Redmond Baseman  6. What is your office phone number [phone]   7.   What is your office fax number [phone]  8.   Anesthesia type (None, local, MAC, general) ? General   Porfirio Mylar 06/22/2018, 2:24 PM  _________________________________________________________________   (provider comments below)

## 2018-06-24 NOTE — Telephone Encounter (Signed)
Routing to pharmacy to address a/c

## 2018-06-24 NOTE — Telephone Encounter (Signed)
Patient with diagnosis of atrial fibrillation on Eliquis for anticoagulation.    Procedure: sinus surgery Date of procedure: TBD  CHADS2-VASc score of  3 (CHF,, AGE,, female)  CrCl 75.6 Platelet count 370  Per office protocol, patient can hold Eliquis for 2 days prior to procedure.

## 2018-08-15 ENCOUNTER — Encounter: Payer: Medicare Other | Admitting: Obstetrics & Gynecology

## 2018-08-18 ENCOUNTER — Telehealth: Payer: Self-pay | Admitting: Cardiology

## 2018-08-18 ENCOUNTER — Telehealth: Payer: Self-pay | Admitting: *Deleted

## 2018-08-18 ENCOUNTER — Encounter: Payer: Self-pay | Admitting: *Deleted

## 2018-08-18 NOTE — Telephone Encounter (Signed)
Follow up:    Patient returning call back. Please call patient back.

## 2018-08-18 NOTE — Telephone Encounter (Signed)
-----   Message from Leah Serge, NP sent at 08/17/2018  7:58 PM EDT ----- So for this pt - if possible I can do with video conference.  On the 9th - if she cannot do I will call her and see how she is and put visit into May so let me know if she can do video conference thanks.

## 2018-08-18 NOTE — Telephone Encounter (Signed)
Left pt a message to call back re: her appt 09/01/2018 with Cecilie Kicks, NP. I want to see if pt has the equipment to do a virtual visit from home, video or phone.

## 2018-08-19 ENCOUNTER — Encounter: Payer: Self-pay | Admitting: Cardiology

## 2018-08-24 NOTE — Telephone Encounter (Signed)
Pt has been set up for a virtual apt. See appt info.

## 2018-08-29 ENCOUNTER — Telehealth: Payer: Self-pay | Admitting: Cardiology

## 2018-08-29 NOTE — Telephone Encounter (Signed)
Called pt and left a message for her to call back.

## 2018-08-29 NOTE — Telephone Encounter (Signed)
New message     Is patient using Smartphone/computer/tablet?smart phone  Did audio/video work video works but no audio  Does patient need telephone visit? no  Best phone number to use? Call husband cell phone for audio and use her phone for video   Special Instructions? [phone] husband - please call her husband phone so that she has audio but she will connect with webex video

## 2018-08-31 NOTE — Progress Notes (Signed)
Virtual Visit via Video Note   This visit type was conducted due to national recommendations for restrictions regarding the COVID-19 Pandemic (e.g. social distancing) in an effort to limit this patient's exposure and mitigate transmission in our community.  Due to her co-morbid illnesses, this patient is at least at moderate risk for complications without adequate follow up.  This format is felt to be most appropriate for this patient at this time.  All issues noted in this document were discussed and addressed.  A limited physical exam was performed with this format.  Please refer to the patient's chart for her consent to telehealth for [facility].   The video portion was with facetime, pt's computer did not have camera.    Evaluation Performed:  Follow-up visit  Date:  09/01/2018   ID:  Leah Reeves, DOB Dec 13, 1950, MRN [id number]  Patient Location: Home  Provider Location: Office  PCP:  Leeroy Cha, MD  Cardiologist:  Candee Furbish, MD  Electrophysiologist:  None   Chief Complaint:  Atrial fib   History of Present Illness:    Leah Reeves is a 68 y.o. female who presents via audio/video conferencing for a telehealth visit today.    She has a hx of a fib beginning 10/26/17 with presentation to ER with A. fib RVR, cough, 140 heart rate, panic and after bad news. Inhailers as well. Pulm sent to ER with HR. Dr. Redmond Baseman, cough, sinus .  No prior history of A. fib.  When she got to the ER, her EKG showed sinus rhythm.  She thinks that coffee and anxiety may have triggered this.  No symptoms of weakness shortness of breath fatigue chest pain bleeding.  She has had frequent PVCs on ECG in the past.  She has been told that she had this for quite some time.  Echo showed moderate diastolic dysfunction.  Normal EF.  She is on Eliquis 5 mg BID for her atrial fib.  Cha2DS2VASc is 3.  Today she has no complaints no lightheadedness no awareness of irregular HR and pulse today 56 and  regular.  No bleeding with eliquis,  She does have trouble taking in evening - we discussed setting her alarm on her phone to remind her to take.    She has no chest pain and no SOB.  Her labs are with her PCP.   She is exercising by walking her dog.  She is trying not to go out.  She is working from home.  She wears ask and gloves when out.    The patient does not have symptoms concerning for COVID-19 infection (fever, chills, cough, or new shortness of breath).    Past Medical History:  Diagnosis Date  . Anxiety   . Arthritis    knees -generalized  . Depression   . Headache    past history - none at present  . Heart murmur   . Osteopenia   . Perimenopausal vasomotor symptoms   . Vertigo    tx. 6 months ago- no problems now,very mild occ.   Past Surgical History:  Procedure Laterality Date  . BREAST BIOPSY    . BREAST CYST ASPIRATION Right 2008  . COLONOSCOPY WITH PROPOFOL N/A 09/18/2014   Procedure: COLONOSCOPY WITH PROPOFOL;  Surgeon: Garlan Fair, MD;  Location: [facility];  Service: Endoscopy;  Laterality: N/A;     Current Meds  Medication Sig  . ALPRAZolam (XANAX) 0.25 MG tablet Take 0.25 mg by mouth 2 (two) times daily  as needed for anxiety.  Marland Kitchen apixaban (ELIQUIS) 5 MG TABS tablet Take 1 tablet (5 mg total) by mouth 2 (two) times daily.  Marland Kitchen buPROPion (WELLBUTRIN XL) 150 MG 24 hr tablet Take 150 mg by mouth every morning.  . cholecalciferol (VITAMIN D) 1000 units tablet Take 2,000 Units by mouth daily.  Marland Kitchen diltiazem (CARDIZEM) 30 MG tablet Take 1 tablet (30 mg total) by mouth 4 (four) times daily as needed.  Marland Kitchen FLUoxetine (PROZAC) 20 MG tablet Take 40 mg by mouth every morning.   . loratadine (CLARITIN) 10 MG tablet Take 10 mg by mouth daily.  Marland Kitchen MUCINEX 600 MG 12 hr tablet Take 1 tablet by mouth daily.  . simvastatin (ZOCOR) 40 MG tablet Take 40 mg by mouth every morning.  . [DISCONTINUED] apixaban (ELIQUIS) 5 MG TABS tablet Take 1 tablet (5 mg total) by mouth 2 (two)  times daily.  . [DISCONTINUED] apixaban (ELIQUIS) 5 MG TABS tablet Take 5 mg by mouth daily.     Allergies:   Codeine and Azithromycin   Social History   Tobacco Use  . Smoking status: Former Smoker    Packs/day: 0.50    Years: 3.00    Pack years: 1.50    Types: Cigarettes    Last attempt to quit: 05/26/1991    Years since quitting: 27.2  . Smokeless tobacco: Never Used  Substance Use Topics  . Alcohol use: Yes    Comment: wine occassionally  . Drug use: No     Family Hx: The patient's family history includes Diabetes in her mother and sister; Heart disease in her father and sister; Hypertension in her mother.  ROS:   Please see the history of present illness.    General:no colds or fevers, no weight changes Skin:no rashes or ulcers HEENT:no blurred vision, no congestion CV:see HPI PUL:see HPI GI:no diarrhea constipation or melena, no indigestion GU:no hematuria, no dysuria MS:no joint pain, no claudication Neuro:no syncope, no lightheadedness Endo:no diabetes, no thyroid disease  All other systems reviewed and are negative.   Prior CV studies:   The following studies were reviewed today:  Echo 11/05/17 Study Conclusions  - Left ventricle: The cavity size was normal. Wall thickness was   normal. Systolic function was normal. The estimated ejection   fraction was in the range of 60% to 65%. Wall motion was normal;   there were no regional wall motion abnormalities. Features are   consistent with a pseudonormal left ventricular filling pattern,   with concomitant abnormal relaxation and increased filling   pressure (grade 2 diastolic dysfunction). GLS -22.6% (normal). - Aortic valve: There was no stenosis. - Mitral valve: There was trivial regurgitation. - Left atrium: The atrium was mildly dilated. - Right ventricle: The cavity size was normal. Systolic function   was normal. - Tricuspid valve: Peak RV-RA gradient (S): 29 mm Hg. - Pulmonary arteries: PA peak  pressure: 32 mm Hg (S). - Inferior vena cava: The vessel was normal in size. The   respirophasic diameter changes were in the normal range (>= 50%),   consistent with normal central venous pressure. - Pericardium, extracardiac: A trivial pericardial effusion was   identified.  Impressions:  - Normal LV size with EF 60-65%. Moderate diastolic dysfunction.   Normal RV size and systolic function. No significant valvular   abnormalities.   Labs/Other Tests and Data Reviewed:    EKG:  An ECG dated 02/2018 was personally reviewed today and demonstrated:  SR and no acute changes.  Recent Labs: 10/26/2017: BUN 10; Creatinine, Ser 0.91; Hemoglobin 15.3; Magnesium 2.1; Platelets 370; Potassium 4.2; Sodium 143; TSH 1.911   Recent Lipid Panel Lab Results  Component Value Date/Time   CHOL 170 11/09/2011 09:52 AM   HDL 43 11/09/2011 09:52 AM    Wt Readings from Last 3 Encounters:  09/01/18 142 lb (64.4 kg)  02/23/18 149 lb (67.6 kg)  12/03/17 146 lb (66.2 kg)     Objective:    Vital Signs:  BP (!) 133/58   Pulse (!) 56   Ht 5\' 4"  (1.626 m)   Wt 142 lb (64.4 kg)   LMP 05/25/2008   BMI 24.37 kg/m    Well nourished, well developed female in no acute distress. Talked in full sentences was walking around her home without issues.  Her color was good.   ASSESSMENT & PLAN:    1. PAF HR is stable. No awareness of a fib.  Continue her cardizem.  She has mild SB instructed if HR is down to 50-52 to call us and we may adjust meds.   2. Anticoagulation.  On Eliquis.  She is having trouble remembering to take the evening dose.  She will have her phone alarm to remind her to take.  She was agreeable to this. 3. HLD per her PCP on statin.   COVID-19 Education: The signs and symptoms of COVID-19 were discussed with the patient and how to seek care for testing (follow up with PCP or arrange E-visit).  The importance of social distancing was discussed today.  Time:   Today, I have spent 10  minutes with the patient with telehealth technology discussing the above problems.     Medication Adjustments/Labs and Tests Ordered: Current medicines are reviewed at length with the patient today.  Concerns regarding medicines are outlined above.  Tests Ordered: No orders of the defined types were placed in this encounter.  Medication Changes: Meds ordered this encounter  Medications  . apixaban (ELIQUIS) 5 MG TABS tablet    Sig: Take 1 tablet (5 mg total) by mouth 2 (two) times daily.    Dispense:  60 tablet    Refill:  6    Disposition:  Follow up in 6 month(s) with Dr. Marlou Porch  Signed, Cecilie Kicks, NP  09/01/2018 8:24 AM    [place]

## 2018-08-31 NOTE — Telephone Encounter (Signed)
Returned pts call.  She doesn't have a webcam on her computer, therefore, will turn this over to a telephone visit.  Pt agrees, and thanked me for the call.

## 2018-09-01 ENCOUNTER — Other Ambulatory Visit: Payer: Self-pay

## 2018-09-01 ENCOUNTER — Telehealth (INDEPENDENT_AMBULATORY_CARE_PROVIDER_SITE_OTHER): Payer: Medicare Other | Admitting: Cardiology

## 2018-09-01 ENCOUNTER — Encounter: Payer: Self-pay | Admitting: Cardiology

## 2018-09-01 VITALS — BP 133/58 | HR 56 | Ht 64.0 in | Wt 142.0 lb

## 2018-09-01 DIAGNOSIS — E7800 Pure hypercholesterolemia, unspecified: Secondary | ICD-10-CM

## 2018-09-01 DIAGNOSIS — I480 Paroxysmal atrial fibrillation: Secondary | ICD-10-CM | POA: Diagnosis not present

## 2018-09-01 DIAGNOSIS — Z7901 Long term (current) use of anticoagulants: Secondary | ICD-10-CM

## 2018-09-01 MED ORDER — APIXABAN 5 MG PO TABS: 5.0000 mg | ORAL_TABLET | Freq: Two times a day (BID) | ORAL | 6 refills | Status: DC

## 2018-09-01 NOTE — Patient Instructions (Signed)
Medication Instructions:  Your physician has recommended you make the following change in your medication:  1.  INCREASE the Eliquis to twice a day SET A ALARM ON YOUR PHONE FOR EVERY EVENING TO REMIND YOU TO TAKE IT  If you need a refill on your cardiac medications before your next appointment, please call your pharmacy.   Lab work: None ordered  If you have labs (blood work) drawn today and your tests are completely normal, you will receive your results only by: Marland Kitchen MyChart Message (if you have MyChart) OR . A paper copy in the mail If you have any lab test that is abnormal or we need to change your treatment, we will call you to review the results.  Testing/Procedures: None ordered   Follow-Up: At [facility], you and your health needs are our priority.  As part of our continuing mission to provide you with exceptional heart care, we have created designated Provider Care Teams.  These Care Teams include your primary Cardiologist (physician) and Advanced Practice Providers (APPs -  Physician Assistants and Nurse Practitioners) who all work together to provide you with the care you need, when you need it. You will need a follow up appointment in 6 months.  Please call our office 2 months in advance to schedule this appointment.  You may see Candee Furbish, MD or one of the following Advanced Practice Providers on your designated Care Team:   Truitt Merle, NP Cecilie Kicks, NP . Kathyrn Drown, NP  Any Other Special Instructions Will Be Listed Below (If Applicable).

## 2018-10-21 ENCOUNTER — Encounter: Payer: Medicare Other | Admitting: Obstetrics & Gynecology

## 2018-11-18 ENCOUNTER — Other Ambulatory Visit: Payer: Self-pay

## 2018-11-21 ENCOUNTER — Other Ambulatory Visit: Payer: Self-pay

## 2018-11-21 ENCOUNTER — Ambulatory Visit: Payer: Medicare Other | Admitting: Obstetrics & Gynecology

## 2018-11-21 ENCOUNTER — Encounter: Payer: Self-pay | Admitting: Obstetrics & Gynecology

## 2018-11-21 VITALS — BP 126/78 | Ht 62.0 in | Wt 145.0 lb

## 2018-11-21 DIAGNOSIS — N951 Menopausal and female climacteric states: Secondary | ICD-10-CM

## 2018-11-21 DIAGNOSIS — Z780 Asymptomatic menopausal state: Secondary | ICD-10-CM | POA: Diagnosis not present

## 2018-11-21 DIAGNOSIS — Z01419 Encounter for gynecological examination (general) (routine) without abnormal findings: Secondary | ICD-10-CM | POA: Diagnosis not present

## 2018-11-21 DIAGNOSIS — Z13820 Encounter for screening for osteoporosis: Secondary | ICD-10-CM

## 2018-11-21 NOTE — Progress Notes (Signed)
Leah Reeves 1951/04/04 [id number]   History:    68 y.o. G0 Married  RP:  Established patient presenting for annual gyn exam   HPI: Menopause, well on no hormone replacement therapy.  No postmenopausal bleeding.  Dryness with intercourse.  KY not helpful.  Urine and bowel movements normal.  Breast normal.  Body mass index 26.52.  Keeping in good fitness, walking the dog.  Healthy nutrition.  Health labs with family physician.  Colonoscopy 2016  Past medical history,surgical history, family history and social history were all reviewed and documented in the EPIC chart.  Gynecologic History Patient's last menstrual period was 05/25/2008. Contraception: post menopausal status Last Pap: 07/2017. Results were: Negative Last mammogram: 10/2017. Results were: Negative Bone Density: Last bone density in 2010.  Will schedule a bone density here now. Colonoscopy: 2016  Obstetric History OB History  Gravida Para Term Preterm AB Living  0            SAB TAB Ectopic Multiple Live Births                ROS: A ROS was performed and pertinent positives and negatives are included in the history.  GENERAL: No fevers or chills. HEENT: No change in vision, no earache, sore throat or sinus congestion. NECK: No pain or stiffness. CARDIOVASCULAR: No chest pain or pressure. No palpitations. PULMONARY: No shortness of breath, cough or wheeze. GASTROINTESTINAL: No abdominal pain, nausea, vomiting or diarrhea, melena or bright red blood per rectum. GENITOURINARY: No urinary frequency, urgency, hesitancy or dysuria. MUSCULOSKELETAL: No joint or muscle pain, no back pain, no recent trauma. DERMATOLOGIC: No rash, no itching, no lesions. ENDOCRINE: No polyuria, polydipsia, no heat or cold intolerance. No recent change in weight. HEMATOLOGICAL: No anemia or easy bruising or bleeding. NEUROLOGIC: No headache, seizures, numbness, tingling or weakness. PSYCHIATRIC: No depression, no loss of interest in normal  activity or change in sleep pattern.     Exam:   BP 126/78   Ht 5\' 2"  (1.575 m)   Wt 145 lb (65.8 kg)   LMP 05/25/2008   BMI 26.52 kg/m   Body mass index is 26.52 kg/m.  General appearance : Well developed well nourished female. No acute distress HEENT: Eyes: no retinal hemorrhage or exudates,  Neck supple, trachea midline, no carotid bruits, no thyroidmegaly Lungs: Clear to auscultation, no rhonchi or wheezes, or rib retractions  Heart: Regular rate and rhythm, no murmurs or gallops Breast:Examined in sitting and supine position were symmetrical in appearance, no palpable masses or tenderness,  no skin retraction, no nipple inversion, no nipple discharge, no skin discoloration, no axillary or supraclavicular lymphadenopathy Abdomen: no palpable masses or tenderness, no rebound or guarding Extremities: no edema or skin discoloration or tenderness  Pelvic: Vulva: Normal             Vagina: No gross lesions or discharge  Cervix: No gross lesions or discharge.  Pap reflex done.  Uterus  AV, normal size, shape and consistency, non-tender and mobile  Adnexa  Without masses or tenderness  Anus: Normal   Assessment/Plan:  68 y.o. female for annual exam   1. Encounter for routine gynecological examination with Papanicolaou smear of cervix Normal gynecologic exam in menopause.  Pap reflex done.  Breast exam normal.  Will schedule screening mammogram now.  Colonoscopy 2016.  Health labs with family physician.  Body mass index 26.52.  Continue with fitness and healthy nutrition.  2. Postmenopause Well on no hormone replacement therapy.  No postmenopausal bleeding.  3. Vaginal dryness, menopausal Vaginal dryness with intercourse.  Recommend coconut oil.  If not sufficient, patient will call for a prescription of vaginal/vulvar estrogen treatment.  4. Screening for osteoporosis Patient will schedule bone density here now.  Recommend vitamin D supplements, calcium intake of 1200 mg  daily and regular weightbearing physical activities. - DG Bone Density; Future  Princess Bruins MD, 10:01 AM 11/21/2018

## 2018-11-21 NOTE — Addendum Note (Signed)
Addended by: Thurnell Garbe A on: 11/21/2018 11:18 AM   Modules accepted: Orders

## 2018-11-21 NOTE — Patient Instructions (Signed)
1. Encounter for routine gynecological examination with Papanicolaou smear of cervix Normal gynecologic exam in menopause.  Pap reflex done.  Breast exam normal.  Will schedule screening mammogram now.  Colonoscopy 2016.  Health labs with family physician.  Body mass index 26.52.  Continue with fitness and healthy nutrition.  2. Postmenopause Well on no hormone replacement therapy.  No postmenopausal bleeding.  3. Vaginal dryness, menopausal Vaginal dryness with intercourse.  Recommend coconut oil.  If not sufficient, patient will call for a prescription of vaginal/vulvar estrogen treatment.  4. Screening for osteoporosis Patient will schedule bone density here now.  Recommend vitamin D supplements, calcium intake of 1200 mg daily and regular weightbearing physical activities. - DG Bone Density; Future  Leah Reeves, it was a pleasure seeing you today!  I will inform you of your results as soon as they are available.

## 2018-11-22 LAB — PAP IG W/ RFLX HPV ASCU

## 2018-11-23 ENCOUNTER — Other Ambulatory Visit: Payer: Self-pay | Admitting: Internal Medicine

## 2018-11-23 DIAGNOSIS — Z1231 Encounter for screening mammogram for malignant neoplasm of breast: Secondary | ICD-10-CM

## 2018-12-07 ENCOUNTER — Other Ambulatory Visit: Payer: Self-pay

## 2018-12-08 ENCOUNTER — Ambulatory Visit (INDEPENDENT_AMBULATORY_CARE_PROVIDER_SITE_OTHER): Payer: Medicare Other

## 2018-12-08 ENCOUNTER — Encounter: Payer: Self-pay | Admitting: Obstetrics and Gynecology

## 2018-12-08 DIAGNOSIS — Z780 Asymptomatic menopausal state: Secondary | ICD-10-CM | POA: Diagnosis not present

## 2018-12-08 DIAGNOSIS — Z13820 Encounter for screening for osteoporosis: Secondary | ICD-10-CM

## 2018-12-08 DIAGNOSIS — M8589 Other specified disorders of bone density and structure, multiple sites: Secondary | ICD-10-CM | POA: Diagnosis not present

## 2018-12-09 ENCOUNTER — Other Ambulatory Visit: Payer: Self-pay | Admitting: Obstetrics & Gynecology

## 2018-12-09 DIAGNOSIS — Z780 Asymptomatic menopausal state: Secondary | ICD-10-CM

## 2018-12-09 DIAGNOSIS — M8589 Other specified disorders of bone density and structure, multiple sites: Secondary | ICD-10-CM

## 2019-01-10 ENCOUNTER — Ambulatory Visit
Admission: RE | Admit: 2019-01-10 | Discharge: 2019-01-10 | Disposition: A | Discharge status: Home / Self Care | Payer: Medicare Other | Source: Ambulatory Visit | Attending: Internal Medicine | Admitting: Internal Medicine

## 2019-01-10 ENCOUNTER — Other Ambulatory Visit: Payer: Self-pay

## 2019-01-10 DIAGNOSIS — Z1231 Encounter for screening mammogram for malignant neoplasm of breast: Secondary | ICD-10-CM

## 2019-03-31 ENCOUNTER — Other Ambulatory Visit (HOSPITAL_COMMUNITY): Payer: Self-pay | Admitting: Cardiology

## 2019-03-31 NOTE — Telephone Encounter (Signed)
Pt last saw Cecilie Kicks, NP on 09/01/18, last labs 06/16/18 Creat 0.76 at [facility] per [facility], age 68, weight 65.8kg, based on specified criteria pt is on appropriate dosage of Eliquis 5mg  BID.  Will refill rx.

## 2019-05-01 ENCOUNTER — Emergency Department (HOSPITAL_COMMUNITY): Payer: Medicare Other

## 2019-05-01 ENCOUNTER — Emergency Department (HOSPITAL_COMMUNITY)
Admission: EM | Admit: 2019-05-01 | Discharge: 2019-05-01 | Disposition: A | Discharge status: Home / Self Care | Payer: Medicare Other | Attending: Emergency Medicine | Admitting: Emergency Medicine

## 2019-05-01 ENCOUNTER — Encounter (HOSPITAL_COMMUNITY): Payer: Self-pay | Admitting: Emergency Medicine

## 2019-05-01 DIAGNOSIS — Y939 Activity, unspecified: Secondary | ICD-10-CM | POA: Insufficient documentation

## 2019-05-01 DIAGNOSIS — I4891 Unspecified atrial fibrillation: Secondary | ICD-10-CM | POA: Diagnosis not present

## 2019-05-01 DIAGNOSIS — M546 Pain in thoracic spine: Secondary | ICD-10-CM | POA: Insufficient documentation

## 2019-05-01 DIAGNOSIS — Z7901 Long term (current) use of anticoagulants: Secondary | ICD-10-CM | POA: Insufficient documentation

## 2019-05-01 DIAGNOSIS — M542 Cervicalgia: Secondary | ICD-10-CM | POA: Insufficient documentation

## 2019-05-01 DIAGNOSIS — S0990XA Unspecified injury of head, initial encounter: Secondary | ICD-10-CM | POA: Diagnosis not present

## 2019-05-01 DIAGNOSIS — Y999 Unspecified external cause status: Secondary | ICD-10-CM | POA: Insufficient documentation

## 2019-05-01 DIAGNOSIS — Y92410 Unspecified street and highway as the place of occurrence of the external cause: Secondary | ICD-10-CM | POA: Diagnosis not present

## 2019-05-01 DIAGNOSIS — Z87891 Personal history of nicotine dependence: Secondary | ICD-10-CM | POA: Diagnosis not present

## 2019-05-01 DIAGNOSIS — Z79899 Other long term (current) drug therapy: Secondary | ICD-10-CM | POA: Insufficient documentation

## 2019-05-01 DIAGNOSIS — T1490XA Injury, unspecified, initial encounter: Secondary | ICD-10-CM

## 2019-05-01 LAB — I-STAT CHEM 8, ED
BUN: 12 mg/dL (ref 8–23)
Calcium, Ion: 1.08 mmol/L — ABNORMAL LOW (ref 1.15–1.40)
Chloride: 106 mmol/L (ref 98–111)
Creatinine, Ser: 0.7 mg/dL (ref 0.44–1.00)
Glucose, Bld: 114 mg/dL — ABNORMAL HIGH (ref 70–99)
HCT: 42 % (ref 36.0–46.0)
Hemoglobin: 14.3 g/dL (ref 12.0–15.0)
Potassium: 4.1 mmol/L (ref 3.5–5.1)
Sodium: 140 mmol/L (ref 135–145)
TCO2: 25 mmol/L (ref 22–32)

## 2019-05-01 MED ORDER — ACETAMINOPHEN 500 MG PO TABS: 1000.0000 mg | ORAL_TABLET | Freq: Once | ORAL | Status: DC

## 2019-05-01 MED ORDER — METHOCARBAMOL 500 MG PO TABS: 500.0000 mg | ORAL_TABLET | Freq: Two times a day (BID) | ORAL | 0 refills | Status: DC

## 2019-05-01 MED ORDER — IOHEXOL 350 MG/ML SOLN
50.0000 mL | Freq: Once | INTRAVENOUS | Status: AC | PRN
  Administered 2019-05-01: 15:00:00 50 mL via INTRAVENOUS
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
## 2019-05-01 NOTE — Discharge Instructions (Signed)
The pain you are experiencing is likely due to muscle strain, you may take tylenol and Robaxin as needed for pain management. You may also use ice and heat, and over-the-counter remedies such as Biofreeze gel or salon pas lidocaine patches. The muscle soreness should improve over the next week. Follow up with your family doctor in the next week for a recheck if you are still having symptoms. Return to ED if pain is worsening, you develop weakness or numbness of extremities, or new or concerning symptoms develop. ° °
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
## 2019-05-01 NOTE — ED Notes (Signed)
Patient verbalizes understanding of discharge instructions. Opportunity for questioning and answers were provided. Armband removed by staff, pt discharged from ED.  

## 2019-07-22 NOTE — Progress Notes (Signed)
Telehealth Visit     Virtual Visit via Video Note   This visit type was conducted due to national recommendations for restrictions regarding the COVID-19 Pandemic (e.g. social distancing) in an effort to limit this patient's exposure and mitigate transmission in our community.  Due to her co-morbid illnesses, this patient is at least at moderate risk for complications without adequate follow up.  This format is felt to be most appropriate for this patient at this time.  All issues noted in this document were discussed and addressed.  A limited physical exam was performed with this format.  Please refer to the patient's chart for her consent to telehealth for [facility].   Evaluation Performed:  Follow-up visit  This visit type was conducted due to national recommendations for restrictions regarding the COVID-19 Pandemic (e.g. social distancing).  This format is felt to be most appropriate for this patient at this time.  All issues noted in this document were discussed and addressed.  No physical exam was performed (except for noted visual exam findings with Video Visits).  Please refer to the patient's chart (MyChart message for video visits and phone note for telephone visits) for the patient's consent to telehealth for [facility].  Date:  07/25/2019   ID:  Leah Reeves, DOB 1950-05-27, MRN [id number]  Patient Location:  Home  Provider location:   Home  PCP:  Leeroy Cha, MD  Cardiologist:  Servando Snare Candee Furbish, MD  Electrophysiologist:  None   Chief Complaint:  Follow up  History of Present Illness:    Leah Reeves is a 69 y.o. female who presents via audio/video conferencing for a telehealth visit today.  Seen for Dr. Marlou Porch.   She has a history of PAF - started in 10/2017 with presentation to the ER with AF with RVR - occurred in stressful situation (bad news). ?trigger with coffee and anxiety. Other issues include PVCs, moderate diastolic dysfunction,  chronic anticoagulation with Eliquis  Last seen by Dr. Marlou Porch in October of 2019. She had a telehealth visit with Mickel Baas in April of 2020. Noted some trouble remembering evening dose of Eliquis.   The patient does not have symptoms concerning for COVID-19 infection (fever, chills, cough, or new shortness of breath).   Seen today by telephone visit. She declined video. She has consented for this visit. She is doing well. No palpitations reported. She does need her short acting Diltiazem refilled - it has expired. No chest pain. Not short of breath. Had her physical last month - feels like her CBC was checked - she will look into this and get a copy sent our way. She notes that as long as she sticks to her routine, she does not miss her evening Eliquis. BP typically lower than today - she was not instructed to have BP reading on hand so this was obtained during the call with CMA. She feels like overall, she is doing well.   Past Medical History:  Diagnosis Date  . Anxiety   . Arthritis    knees -generalized  . Depression   . Headache    past history - none at present  . Heart murmur   . Osteopenia   . Perimenopausal vasomotor symptoms   . Vertigo    tx. 6 months ago- no problems now,very mild occ.   Past Surgical History:  Procedure Laterality Date  . BREAST BIOPSY    . BREAST CYST ASPIRATION Right 2008  . COLONOSCOPY WITH PROPOFOL N/A 09/18/2014  Procedure: COLONOSCOPY WITH PROPOFOL;  Surgeon: Garlan Fair, MD;  Location: [facility];  Service: Endoscopy;  Laterality: N/A;     Current Meds  Medication Sig  . ALPRAZolam (XANAX) 0.25 MG tablet Take 0.25 mg by mouth 2 (two) times daily as needed for anxiety.  Marland Kitchen buPROPion (WELLBUTRIN XL) 150 MG 24 hr tablet Take 150 mg by mouth every morning.  . cholecalciferol (VITAMIN D) 1000 units tablet Take 2,000 Units by mouth daily.  Marland Kitchen diltiazem (CARDIZEM) 30 MG tablet Take 1 tablet (30 mg total) by mouth 4 (four) times daily as needed.  Marland Kitchen  ELIQUIS 5 MG TABS tablet TAKE 1 TABLET BY MOUTH TWICE A DAY  . FLUoxetine (PROZAC) 20 MG tablet Take 40 mg by mouth every morning.   Marland Kitchen guaiFENesin (MUCINEX) 600 MG 12 hr tablet Take 600 mg by mouth as needed (congestion).  Marland Kitchen loratadine (CLARITIN) 10 MG tablet Take 10 mg by mouth daily.  . simvastatin (ZOCOR) 40 MG tablet Take 40 mg by mouth every morning.     Allergies:   Codeine and Azithromycin   Social History   Tobacco Use  . Smoking status: Former Smoker    Packs/day: 0.50    Years: 3.00    Pack years: 1.50    Types: Cigarettes    Quit date: 05/26/1991    Years since quitting: 28.1  . Smokeless tobacco: Never Used  Substance Use Topics  . Alcohol use: Yes    Comment: wine occassionally  . Drug use: No     Family Hx: The patient's family history includes Diabetes in her mother and sister; Heart disease in her father and sister; Hypertension in her mother.  ROS:   Please see the history of present illness.   All other systems reviewed are negative.    Objective:    Vital Signs:  BP (!) 142/53   Pulse 60   Ht 5\' 4"  (1.626 m)   Wt 150 lb (68 kg)   LMP 05/25/2008   BMI 25.75 kg/m    Wt Readings from Last 3 Encounters:  07/25/19 150 lb (68 kg)  11/21/18 145 lb (65.8 kg)  09/01/18 142 lb (64.4 kg)    Alert female in no acute distress. Not short of breath on the phone. Sounds good.    Labs/Other Tests and Data Reviewed:    Lab Results  Component Value Date   WBC 9.1 10/26/2017   HGB 14.3 05/01/2019   HCT 42.0 05/01/2019   PLT 370 10/26/2017   GLUCOSE 114 (H) 05/01/2019   CHOL 170 11/09/2011   HDL 43 11/09/2011   NA 140 05/01/2019   K 4.1 05/01/2019   CL 106 05/01/2019   CREATININE 0.70 05/01/2019   BUN 12 05/01/2019   CO2 24 10/26/2017   TSH 1.911 10/26/2017   HGBA1C 5.6 11/09/2011        BNP (last 3 results) No results for input(s): BNP in the last 8760 hours.  ProBNP (last 3 results) No results for input(s): PROBNP in the last 8760  hours.    Prior CV studies:    The following studies were reviewed today:  Echo 11/05/17 Study Conclusions  - Left ventricle: The cavity size was normal. Wall thickness was normal. Systolic function was normal. The estimated ejection fraction was in the range of 60% to 65%. Wall motion was normal; there were no regional wall motion abnormalities. Features are consistent with a pseudonormal left ventricular filling pattern, with concomitant abnormal relaxation and increased filling pressure (  grade 2 diastolic dysfunction). GLS -22.6% (normal). - Aortic valve: There was no stenosis. - Mitral valve: There was trivial regurgitation. - Left atrium: The atrium was mildly dilated. - Right ventricle: The cavity size was normal. Systolic function was normal. - Tricuspid valve: Peak RV-RA gradient (S): 29 mm Hg. - Pulmonary arteries: PA peak pressure: 32 mm Hg (S). - Inferior vena cava: The vessel was normal in size. The respirophasic diameter changes were in the normal range (>= 50%), consistent with normal central venous pressure. - Pericardium, extracardiac: A trivial pericardial effusion was identified.  Impressions:  - Normal LV size with EF 60-65%. Moderate diastolic dysfunction. Normal RV size and systolic function. No significant valvular abnormalities.    ASSESSMENT & PLAN:    1.  PAF - doing well clinically with no issues noted.   2. Chronic anticoagulation - on Eliquis - no significant issues with bleeding/bruising. She will get a copy of her CBC sent our way.   3. HLD - labs from the [facility] noted from PCP and look good.   4. Diastolic dysfunction - she reports good BP control at home. No worrisome symptoms to suggest CHF.   5. COVID-19 Education: The signs and symptoms of COVID-19 were discussed with the patient and how to seek care for testing (follow up with PCP or arrange E-visit).  The importance of social distancing, staying at home,  hand hygiene and wearing a mask when out in public were discussed today.  Patient Risk:   After full review of this patient's clinical status, I feel that they are at least moderate risk at this time.  Time:   Today, I have spent 7 minutes with the patient with telehealth technology discussing the above issues.     Medication Adjustments/Labs and Tests Ordered: Current medicines are reviewed at length with the patient today.  Concerns regarding medicines are outlined above.   Tests Ordered: No orders of the defined types were placed in this encounter.   Medication Changes: No orders of the defined types were placed in this encounter.   Disposition:  FU with Dr. Marlou Porch in the office in 6 months with EKG.     Patient is agreeable to this plan and will call if any problems develop in the interim.   Amie Critchley, NP  07/25/2019 9:12 AM    [place]

## 2019-07-25 ENCOUNTER — Encounter: Payer: Self-pay | Admitting: Nurse Practitioner

## 2019-07-25 ENCOUNTER — Other Ambulatory Visit: Payer: Self-pay

## 2019-07-25 ENCOUNTER — Telehealth (INDEPENDENT_AMBULATORY_CARE_PROVIDER_SITE_OTHER): Payer: Medicare Other | Admitting: Nurse Practitioner

## 2019-07-25 VITALS — BP 142/53 | HR 60 | Ht 64.0 in | Wt 150.0 lb

## 2019-07-25 DIAGNOSIS — Z7901 Long term (current) use of anticoagulants: Secondary | ICD-10-CM

## 2019-07-25 DIAGNOSIS — E785 Hyperlipidemia, unspecified: Secondary | ICD-10-CM

## 2019-07-25 DIAGNOSIS — I480 Paroxysmal atrial fibrillation: Secondary | ICD-10-CM | POA: Diagnosis not present

## 2019-07-25 DIAGNOSIS — Z7189 Other specified counseling: Secondary | ICD-10-CM

## 2019-07-25 DIAGNOSIS — E7800 Pure hypercholesterolemia, unspecified: Secondary | ICD-10-CM

## 2019-07-25 MED ORDER — DILTIAZEM HCL 30 MG PO TABS: 30.0000 mg | ORAL_TABLET | Freq: Four times a day (QID) | ORAL | 1 refills | Status: DC | PRN

## 2019-07-25 NOTE — Patient Instructions (Addendum)
After Visit Summary:  We will be checking the following labs today - NONE  Please see if you can get [place] a copy of your last CBC with your PCP   Medication Instructions:    Continue with your current medicines.   I sent in your refill for the Diltiazem today.    If you need a refill on your cardiac medications before your next appointment, please call your pharmacy.     Testing/Procedures To Be Arranged:  N/A  Follow-Up:   See Dr. Marlou Porch in the office in 6 months with EKG.     At [facility], you and your health needs are our priority.  As part of our continuing mission to provide you with exceptional heart care, we have created designated Provider Care Teams.  These Care Teams include your primary Cardiologist (physician) and Advanced Practice Providers (APPs -  Physician Assistants and Nurse Practitioners) who all work together to provide you with the care you need, when you need it.  Special Instructions:  . Stay safe, stay home, wash your hands for at least 20 seconds and wear a mask when out in public.  . It was good to talk with you today.    Call the [place] office at [phone] if you have any questions, problems or concerns.

## 2019-11-22 ENCOUNTER — Ambulatory Visit (INDEPENDENT_AMBULATORY_CARE_PROVIDER_SITE_OTHER): Payer: Medicare Other | Admitting: Obstetrics & Gynecology

## 2019-11-22 ENCOUNTER — Encounter: Payer: Self-pay | Admitting: Obstetrics & Gynecology

## 2019-11-22 ENCOUNTER — Other Ambulatory Visit: Payer: Self-pay

## 2019-11-22 VITALS — BP 128/80 | Ht 62.0 in | Wt 158.0 lb

## 2019-11-22 DIAGNOSIS — Z01419 Encounter for gynecological examination (general) (routine) without abnormal findings: Secondary | ICD-10-CM

## 2019-11-22 DIAGNOSIS — Z780 Asymptomatic menopausal state: Secondary | ICD-10-CM

## 2019-11-22 DIAGNOSIS — M8589 Other specified disorders of bone density and structure, multiple sites: Secondary | ICD-10-CM | POA: Diagnosis not present

## 2019-11-22 NOTE — Progress Notes (Signed)
Leah Reeves [birth year] [id number]   History:    69 y.o.  G0 Married  RP:  Established patient presenting for annual gyn exam   HPI: Menopause, well on no hormone replacement therapy.  No postmenopausal bleeding.  Dryness with intercourse.  Better with new lubricant.  Urine and bowel movements normal.  Breast normal.  Body mass index 28.9.  Keeping in good fitness, walking the dog.  Healthy nutrition.  Health labs with family physician.  Colonoscopy 2016   Past medical history,surgical history, family history and social history were all reviewed and documented in the EPIC chart.  Gynecologic History Patient's last menstrual period was 05/25/2008.  Obstetric History OB History  Gravida Para Term Preterm AB Living  0 0 0 0 0 0  SAB TAB Ectopic Multiple Live Births  0 0 0 0 0     ROS: A ROS was performed and pertinent positives and negatives are included in the history.  GENERAL: No fevers or chills. HEENT: No change in vision, no earache, sore throat or sinus congestion. NECK: No pain or stiffness. CARDIOVASCULAR: No chest pain or pressure. No palpitations. PULMONARY: No shortness of breath, cough or wheeze. GASTROINTESTINAL: No abdominal pain, nausea, vomiting or diarrhea, melena or bright red blood per rectum. GENITOURINARY: No urinary frequency, urgency, hesitancy or dysuria. MUSCULOSKELETAL: No joint or muscle pain, no back pain, no recent trauma. DERMATOLOGIC: No rash, no itching, no lesions. ENDOCRINE: No polyuria, polydipsia, no heat or cold intolerance. No recent change in weight. HEMATOLOGICAL: No anemia or easy bruising or bleeding. NEUROLOGIC: No headache, seizures, numbness, tingling or weakness. PSYCHIATRIC: No depression, no loss of interest in normal activity or change in sleep pattern.     Exam:   BP 128/80 (BP Location: Right Arm, Patient Position: Sitting, Cuff Size: Normal)   Ht 5\' 2"  (1.575 m)   Wt 158 lb (71.7 kg)   LMP 05/25/2008   BMI 28.90 kg/m    Body mass index is 28.9 kg/m.  General appearance : Well developed well nourished female. No acute distress HEENT: Eyes: no retinal hemorrhage or exudates,  Neck supple, trachea midline, no carotid bruits, no thyroidmegaly Lungs: Clear to auscultation, no rhonchi or wheezes, or rib retractions  Heart: Regular rate and rhythm, no murmurs or gallops Breast:Examined in sitting and supine position were symmetrical in appearance, no palpable masses or tenderness,  no skin retraction, no nipple inversion, no nipple discharge, no skin discoloration, no axillary or supraclavicular lymphadenopathy Abdomen: no palpable masses or tenderness, no rebound or guarding Extremities: no edema or skin discoloration or tenderness  Pelvic: Vulva: Normal with atrophy of menopause.             Vagina: No gross lesions or discharge  Cervix: No gross lesions or discharge  Uterus  AV, normal size, shape and consistency, non-tender and mobile  Adnexa  Without masses or tenderness  Anus: Normal   Assessment/Plan:  69 y.o. female for annual exam   1. Well female exam with routine gynecological exam Normal gynecologic exam in menopause.  No indication to repeat a Pap test this year.  Breast exam normal.  Last mammogram August 2020 was negative.  Colonoscopy 2016.  Body mass index 28.9.  Continue with healthy nutrition and fitness.  Health labs with family physician.  2. Postmenopause Well on no hormone replacement therapy.  No postmenopausal bleeding.  3. Osteopenia of multiple sites Last bone density July 2020.  We will repeat that 2 years.  Vitamin D supplements,  calcium intake of 1200 mg daily and regular weightbearing physical activity is recommended.  Princess Bruins MD, 10:04 AM 11/22/2019

## 2019-11-25 ENCOUNTER — Encounter: Payer: Self-pay | Admitting: Obstetrics & Gynecology

## 2019-11-25 NOTE — Patient Instructions (Signed)
1. Well female exam with routine gynecological exam Normal gynecologic exam in menopause.  No indication to repeat a Pap test this year.  Breast exam normal.  Last mammogram August 2020 was negative.  Colonoscopy 2016.  Body mass index 28.9.  Continue with healthy nutrition and fitness.  Health labs with family physician.  2. Postmenopause Well on no hormone replacement therapy.  No postmenopausal bleeding.  3. Osteopenia of multiple sites Last bone density July 2020.  We will repeat that 2 years.  Vitamin D supplements, calcium intake of 1200 mg daily and regular weightbearing physical activity is recommended.  Pam, it was a pleasure seeing you today!

## 2020-01-02 ENCOUNTER — Other Ambulatory Visit: Payer: Self-pay | Admitting: Internal Medicine

## 2020-01-02 DIAGNOSIS — Z1231 Encounter for screening mammogram for malignant neoplasm of breast: Secondary | ICD-10-CM

## 2020-01-16 ENCOUNTER — Ambulatory Visit
Admission: RE | Admit: 2020-01-16 | Discharge: 2020-01-16 | Disposition: A | Discharge status: Home / Self Care | Payer: Medicare Other | Source: Ambulatory Visit | Attending: Internal Medicine | Admitting: Internal Medicine

## 2020-01-16 ENCOUNTER — Other Ambulatory Visit: Payer: Self-pay

## 2020-01-16 DIAGNOSIS — Z1231 Encounter for screening mammogram for malignant neoplasm of breast: Secondary | ICD-10-CM

## 2020-02-20 ENCOUNTER — Ambulatory Visit: Payer: Medicare Other | Admitting: Cardiology

## 2020-02-22 ENCOUNTER — Ambulatory Visit (INDEPENDENT_AMBULATORY_CARE_PROVIDER_SITE_OTHER): Payer: Medicare Other | Admitting: Cardiology

## 2020-02-22 ENCOUNTER — Encounter: Payer: Self-pay | Admitting: Cardiology

## 2020-02-22 ENCOUNTER — Other Ambulatory Visit: Payer: Self-pay

## 2020-02-22 VITALS — BP 120/60 | HR 60 | Ht 62.0 in | Wt 161.0 lb

## 2020-02-22 DIAGNOSIS — Z7901 Long term (current) use of anticoagulants: Secondary | ICD-10-CM

## 2020-02-22 DIAGNOSIS — I480 Paroxysmal atrial fibrillation: Secondary | ICD-10-CM | POA: Diagnosis not present

## 2020-02-22 DIAGNOSIS — E7800 Pure hypercholesterolemia, unspecified: Secondary | ICD-10-CM

## 2020-02-22 MED ORDER — APIXABAN 5 MG PO TABS: 5.0000 mg | ORAL_TABLET | Freq: Two times a day (BID) | ORAL | 11 refills | Status: DC

## 2020-02-22 NOTE — Patient Instructions (Signed)
Medication Instructions:  The current medical regimen is effective;  continue present plan and medications.  *If you need a refill on your cardiac medications before your next appointment, please call your pharmacy*  Follow-Up: At [facility], you and your health needs are our priority.  As part of our continuing mission to provide you with exceptional heart care, we have created designated Provider Care Teams.  These Care Teams include your primary Cardiologist (physician) and Advanced Practice Providers (APPs -  Physician Assistants and Nurse Practitioners) who all work together to provide you with the care you need, when you need it.  We recommend signing up for the patient portal called "MyChart".  Sign up information is provided on this After Visit Summary.  MyChart is used to connect with patients for Virtual Visits (Telemedicine).  Patients are able to view lab/test results, encounter notes, upcoming appointments, etc.  Non-urgent messages can be sent to your provider as well.   To learn more about what you can do with MyChart, go to [email or web address].    Your next appointment:   12 month(s)  The format for your next appointment:   In Person  Provider:   Mark Skains, MD   Thank you for choosing  [facility]!!      

## 2020-02-22 NOTE — Progress Notes (Signed)
Cardiology Office Note:    Date:  02/22/2020   ID:  Leah Reeves, DOB 03-Jul-1950, MRN [id number]  PCP:  Leeroy Cha, MD  [facility] Cardiologist:  Candee Furbish, MD  [place] Electrophysiologist:  None   Referring MD: Leeroy Cha,*     History of Present Illness:    Leah Reeves is a 69 y.o. female here for the follow-up of atrial fibrillation.  Had prior atrial fibrillation paroxysmal started in June 2019 during a stressful situation.  Other possible triggers are coffee anxiety.  Has chronic anticoagulation with Eliquis.  She was remembering some of the evening doses of Eliquis previously.  Seems to be doing quite well.  No chest pain or shortness of breath.  Unfortunately, sister had foot surgery after injury and died about a week later while in rehab facility.  Past Medical History:  Diagnosis Date  . Anxiety   . Arthritis    knees -generalized  . Depression   . Headache    past history - none at present  . Heart murmur   . Osteopenia   . Perimenopausal vasomotor symptoms   . Vertigo    tx. 6 months ago- no problems now,very mild occ.    Past Surgical History:  Procedure Laterality Date  . BREAST BIOPSY    . BREAST CYST ASPIRATION Right 2008  . COLONOSCOPY WITH PROPOFOL N/A 09/18/2014   Procedure: COLONOSCOPY WITH PROPOFOL;  Surgeon: Garlan Fair, MD;  Location: [facility];  Service: Endoscopy;  Laterality: N/A;    Current Medications: Current Meds  Medication Sig  . ALPRAZolam (XANAX) 0.25 MG tablet Take 0.25 mg by mouth 2 (two) times daily as needed for anxiety.  Marland Kitchen apixaban (ELIQUIS) 5 MG TABS tablet Take 1 tablet (5 mg total) by mouth 2 (two) times daily.  Marland Kitchen buPROPion (WELLBUTRIN XL) 150 MG 24 hr tablet Take 150 mg by mouth every morning.  . cholecalciferol (VITAMIN D) 1000 units tablet Take 2,000 Units by mouth daily.  Marland Kitchen diltiazem (CARDIZEM) 30 MG tablet Take 1 tablet (30 mg total) by mouth 4 (four) times daily  as needed.  Marland Kitchen FLUoxetine (PROZAC) 20 MG capsule Take 40 mg by mouth every morning.  Marland Kitchen guaiFENesin (MUCINEX) 600 MG 12 hr tablet Take 600 mg by mouth as needed (congestion).  Marland Kitchen loratadine (CLARITIN) 10 MG tablet Take 10 mg by mouth daily.  . simvastatin (ZOCOR) 40 MG tablet Take 40 mg by mouth every morning.  . [DISCONTINUED] ELIQUIS 5 MG TABS tablet TAKE 1 TABLET BY MOUTH TWICE A DAY     Allergies:   Codeine and Azithromycin   Social History   Socioeconomic History  . Marital status: Married    Spouse name: Not on file  . Number of children: Not on file  . Years of education: Not on file  . Highest education level: Not on file  Occupational History  . Not on file  Tobacco Use  . Smoking status: Former Smoker    Packs/day: 0.50    Years: 3.00    Pack years: 1.50    Types: Cigarettes    Quit date: 05/26/1991    Years since quitting: 28.7  . Smokeless tobacco: Never Used  Vaping Use  . Vaping Use: Never used  Substance and Sexual Activity  . Alcohol use: Not Currently  . Drug use: No  . Sexual activity: Yes    Partners: Male    Birth control/protection: Post-menopausal    Comment: 1st intercourse- 23, partners- 3,  married- 27 yrs   Other Topics Concern  . Not on file  Social History Narrative  . Not on file   Social Determinants of Health   Financial Resource Strain:   . Difficulty of Paying Living Expenses: Not on file  Food Insecurity:   . Worried About Charity fundraiser in the Last Year: Not on file  . Ran Out of Food in the Last Year: Not on file  Transportation Needs:   . Lack of Transportation (Medical): Not on file  . Lack of Transportation (Non-Medical): Not on file  Physical Activity:   . Days of Exercise per Week: Not on file  . Minutes of Exercise per Session: Not on file  Stress:   . Feeling of Stress : Not on file  Social Connections:   . Frequency of Communication with Friends and Family: Not on file  . Frequency of Social Gatherings with Friends  and Family: Not on file  . Attends Religious Services: Not on file  . Active Member of Clubs or Organizations: Not on file  . Attends Archivist Meetings: Not on file  . Marital Status: Not on file     Family History: The patient's family history includes Diabetes in her mother and sister; Heart disease in her father and sister; Hypertension in her mother.  ROS:   Please see the history of present illness.     All other systems reviewed and are negative.  EKGs/Labs/Other Studies Reviewed:    The following studies were reviewed today:  Echocardiogram showed normal ejection fraction grade 2 diastolic dysfunction  EKG:  EKG is  ordered today.  The ekg ordered today demonstrates sinus rhythm 60 no other changes.  Recent Labs: 05/01/2019: BUN 12; Creatinine, Ser 0.70; Hemoglobin 14.3; Potassium 4.1; Sodium 140  Recent Lipid Panel    Component Value Date/Time   CHOL 170 11/09/2011 0952   HDL 43 11/09/2011 0952    Physical Exam:    VS:  BP 120/60   Pulse 60   Ht 5\' 2"  (1.575 m)   Wt 161 lb (73 kg)   LMP 05/25/2008   SpO2 96%   BMI 29.45 kg/m     Wt Readings from Last 3 Encounters:  02/22/20 161 lb (73 kg)  11/22/19 158 lb (71.7 kg)  07/25/19 150 lb (68 kg)     GEN:  Well nourished, well developed in no acute distress HEENT: Normal NECK: No JVD; No carotid bruits LYMPHATICS: No lymphadenopathy CARDIAC: RRR, 2/6 systolic right upper sternal border murmur, no rubs, gallops RESPIRATORY:  Clear to auscultation without rales, wheezing or rhonchi  ABDOMEN: Soft, non-tender, non-distended MUSCULOSKELETAL:  No edema; No deformity  SKIN: Warm and dry NEUROLOGIC:  Alert and oriented x 3 PSYCHIATRIC:  Normal affect   ASSESSMENT:    1. Paroxysmal atrial fibrillation (HCC)   2. Chronic anticoagulation   3. Pure hypercholesterolemia    PLAN:    In order of problems listed above:  Paroxysmal atrial fibrillation -Doing well no recent occurrences.  No changes  made to medications.  Has not needed to use any diltiazem.  Doing well.  No palpitations.  Chronic anticoagulation -Continue with Eliquis no bleeding.  Hemoglobin 14.1 from outside labs, creatinine 0.85.  Hyperlipidemia -Continuing with simvastatin 40 mg.  LDL 98 at last check.  HDL 65.  TSH normal.   Medication Adjustments/Labs and Tests Ordered: Current medicines are reviewed at length with the patient today.  Concerns regarding medicines are outlined above.  Orders  Placed This Encounter  Procedures  . EKG 12-Lead   Meds ordered this encounter  Medications  . apixaban (ELIQUIS) 5 MG TABS tablet    Sig: Take 1 tablet (5 mg total) by mouth 2 (two) times daily.    Dispense:  60 tablet    Refill:  11    Patient Instructions  Medication Instructions:  The current medical regimen is effective;  continue present plan and medications.  *If you need a refill on your cardiac medications before your next appointment, please call your pharmacy*  Follow-Up: At [facility], you and your health needs are our priority.  As part of our continuing mission to provide you with exceptional heart care, we have created designated Provider Care Teams.  These Care Teams include your primary Cardiologist (physician) and Advanced Practice Providers (APPs -  Physician Assistants and Nurse Practitioners) who all work together to provide you with the care you need, when you need it.  We recommend signing up for the patient portal called "MyChart".  Sign up information is provided on this After Visit Summary.  MyChart is used to connect with patients for Virtual Visits (Telemedicine).  Patients are able to view lab/test results, encounter notes, upcoming appointments, etc.  Non-urgent messages can be sent to your provider as well.   To learn more about what you can do with MyChart, go to [email or web address].    Your next appointment:   12 month(s)  The format for your next appointment:   In  Person  Provider:   Candee Furbish, MD  Thank you for choosing [facility]!!        Signed, Candee Furbish, MD  02/22/2020 8:49 AM    [place]

## 2020-05-30 DIAGNOSIS — J209 Acute bronchitis, unspecified: Secondary | ICD-10-CM | POA: Diagnosis not present

## 2020-05-30 DIAGNOSIS — U071 COVID-19: Secondary | ICD-10-CM | POA: Diagnosis not present

## 2020-06-03 DIAGNOSIS — U071 COVID-19: Secondary | ICD-10-CM | POA: Diagnosis not present

## 2020-07-01 DIAGNOSIS — Z79899 Other long term (current) drug therapy: Secondary | ICD-10-CM | POA: Diagnosis not present

## 2020-07-01 DIAGNOSIS — Z0000 Encounter for general adult medical examination without abnormal findings: Secondary | ICD-10-CM | POA: Diagnosis not present

## 2020-07-01 DIAGNOSIS — E785 Hyperlipidemia, unspecified: Secondary | ICD-10-CM | POA: Diagnosis not present

## 2020-07-01 DIAGNOSIS — I480 Paroxysmal atrial fibrillation: Secondary | ICD-10-CM | POA: Diagnosis not present

## 2020-07-01 DIAGNOSIS — Z7189 Other specified counseling: Secondary | ICD-10-CM | POA: Diagnosis not present

## 2020-07-01 DIAGNOSIS — J449 Chronic obstructive pulmonary disease, unspecified: Secondary | ICD-10-CM | POA: Diagnosis not present

## 2020-07-01 DIAGNOSIS — Z23 Encounter for immunization: Secondary | ICD-10-CM | POA: Diagnosis not present

## 2020-07-09 DIAGNOSIS — H43811 Vitreous degeneration, right eye: Secondary | ICD-10-CM | POA: Diagnosis not present

## 2020-07-15 ENCOUNTER — Other Ambulatory Visit: Payer: Self-pay | Admitting: *Deleted

## 2020-07-15 MED ORDER — APIXABAN 5 MG PO TABS: 5.0000 mg | ORAL_TABLET | Freq: Two times a day (BID) | ORAL | 2 refills | Status: DC

## 2020-07-15 NOTE — Telephone Encounter (Signed)
Eliquis 5mg  paper refill request received from [facility]. Patient is 70 years old, weight-73kg, Crea-0.85 on 07/01/20 via [facility], Diagnosis-Afib, and last seen by Dr. Marlou Porch on 02/22/2020. Dose is appropriate based on dosing criteria. Will send in refill to requested pharmacy.

## 2020-07-19 DIAGNOSIS — E785 Hyperlipidemia, unspecified: Secondary | ICD-10-CM | POA: Diagnosis not present

## 2020-07-19 DIAGNOSIS — J449 Chronic obstructive pulmonary disease, unspecified: Secondary | ICD-10-CM | POA: Diagnosis not present

## 2020-07-19 DIAGNOSIS — I480 Paroxysmal atrial fibrillation: Secondary | ICD-10-CM | POA: Diagnosis not present

## 2020-07-24 DIAGNOSIS — E785 Hyperlipidemia, unspecified: Secondary | ICD-10-CM | POA: Diagnosis not present

## 2020-07-24 DIAGNOSIS — I480 Paroxysmal atrial fibrillation: Secondary | ICD-10-CM | POA: Diagnosis not present

## 2020-07-24 DIAGNOSIS — J449 Chronic obstructive pulmonary disease, unspecified: Secondary | ICD-10-CM | POA: Diagnosis not present

## 2020-08-15 ENCOUNTER — Telehealth: Payer: Self-pay | Admitting: *Deleted

## 2020-08-15 DIAGNOSIS — J322 Chronic ethmoidal sinusitis: Secondary | ICD-10-CM | POA: Diagnosis not present

## 2020-08-15 DIAGNOSIS — J320 Chronic maxillary sinusitis: Secondary | ICD-10-CM | POA: Diagnosis not present

## 2020-08-15 DIAGNOSIS — J321 Chronic frontal sinusitis: Secondary | ICD-10-CM | POA: Diagnosis not present

## 2020-08-15 NOTE — Telephone Encounter (Signed)
Clinical pharmacist to review Eliquis 

## 2020-08-15 NOTE — Telephone Encounter (Signed)
   [facility] Pre-operative Risk Assessment    HEARTCARE STAFF: - Please ensure there is not already an duplicate clearance open for this procedure. - Under Visit Info/Reason for Call, type in Other and utilize the format Clearance MM/DD/YY or Clearance TBD. Do not use dashes or single digits. - If request is for dental extraction, please clarify the # of teeth to be extracted.  Request for surgical clearance:  1. What type of surgery is being performed? SINUS SURGERY   2. When is this surgery scheduled? 08/30/20   3. What type of clearance is required (medical clearance vs. Pharmacy clearance to hold med vs. Both)? BOTH  4. Are there any medications that need to be held prior to surgery and how long? ELIQUIS   5. Practice name and name of physician performing surgery? [facility]; DR. DWIGHT BATES   6. What is the office phone number? [phone]   7.   What is the office fax number? [phone]  8.   Anesthesia type (None, local, MAC, general) ? NOT LISTED   Julaine Hua 08/15/2020, 2:14 PM  _________________________________________________________________   (provider comments below)

## 2020-08-15 NOTE — Telephone Encounter (Signed)
Patient with diagnosis of ATRIAL FIBRILLATION on ELIQUIS for anticoagulation.    Procedure: SINUS SURGERY Date of procedure: 08-30-2020   CHA2DS2-VASc Score = 2  This indicates a 2.2% annual risk of stroke. The patient's score is based upon: CHF History: No HTN History: No Diabetes History: No Stroke History: No Vascular Disease History: No Age Score: 1 Gender Score: 1      CrCl = 88ml/min (Scr on KPN 0.85 on 07/01/20)  Per office protocol, patient can hold eliquis for 3 days prior to procedure.   Patient will not need bridging with Lovenox (enoxaparin) around procedure.

## 2020-08-16 NOTE — Telephone Encounter (Signed)
   Primary Cardiologist: Candee Furbish, MD  Chart reviewed as part of pre-operative protocol coverage. Patient was contacted 08/16/2020 in reference to pre-operative risk assessment for pending surgery as outlined below.  Leah Reeves was last seen on 02/22/2020 by Dr. Marlou Porch.  Since that day, Leah Reeves has done well without chest pain or shortness of breath.   Therefore, based on ACC/AHA guidelines, the patient would be at acceptable risk for the planned procedure without further cardiovascular testing.   Per our clinical pharmacist, patient can hold Eliquis for 3 days prior to the procedure and restart as soon as possible after the procedure at the surgeon's discretion.  The patient was advised that if she develops new symptoms prior to surgery to contact our office to arrange for a follow-up visit, and she verbalized understanding.  I will route this recommendation to the requesting party via Epic fax function and remove from pre-op pool. Please call with questions.  [place], [place] 08/16/2020, 2:07 PM

## 2020-08-22 ENCOUNTER — Telehealth: Payer: Self-pay | Admitting: Cardiology

## 2020-08-22 NOTE — Telephone Encounter (Signed)
   Primary Cardiologist: Candee Furbish, MD  Chart reviewed as part of pre-operative protocol coverage. Simple dental extractions are considered low risk procedures per guidelines and generally do not require any specific cardiac clearance. It is also generally accepted that for simple extractions and dental cleanings, there is no need to interrupt blood thinner therapy.   SBE prophylaxis is not required for the patient.  I will route this recommendation to the requesting party via Epic fax function and remove from pre-op pool.  Please call with questions.  Deberah Pelton, NP 08/22/2020, 10:15 AM

## 2020-08-22 NOTE — Telephone Encounter (Signed)
   [facility] Pre-operative Risk Assessment    Request for surgical clearance:  1. What type of surgery is being performed? 1 Tooth extraction, tooth #3   2. When is this surgery scheduled?  08/28/20 _0 :30am  3. What type of clearance is required (medical clearance vs. Pharmacy clearance to hold med vs. Both)? Pharmacy  4. Are there any medications that need to be held prior to surgery and how long? Possibly Eliquis  TBD by cardiologist  5. Practice name and name of physician performing surgery?  Dr. Posey Pronto office, Dr. Posey Pronto   6. What is your office phone number [phone]   7.   What is your office fax number [phone]  8.   Anesthesia type (None, local, MAC, general) ?  [facility] [place] 08/22/2020, 10:02 AM  _________________________________________________________________   (provider comments below)

## 2020-10-01 DIAGNOSIS — E785 Hyperlipidemia, unspecified: Secondary | ICD-10-CM | POA: Diagnosis not present

## 2020-10-01 DIAGNOSIS — J449 Chronic obstructive pulmonary disease, unspecified: Secondary | ICD-10-CM | POA: Diagnosis not present

## 2020-10-01 DIAGNOSIS — I480 Paroxysmal atrial fibrillation: Secondary | ICD-10-CM | POA: Diagnosis not present

## 2020-10-18 ENCOUNTER — Other Ambulatory Visit: Payer: Self-pay | Admitting: Otolaryngology

## 2020-10-18 DIAGNOSIS — J329 Chronic sinusitis, unspecified: Secondary | ICD-10-CM | POA: Diagnosis not present

## 2020-10-18 DIAGNOSIS — J322 Chronic ethmoidal sinusitis: Secondary | ICD-10-CM | POA: Diagnosis not present

## 2020-10-18 DIAGNOSIS — J320 Chronic maxillary sinusitis: Secondary | ICD-10-CM | POA: Diagnosis not present

## 2020-10-31 DIAGNOSIS — J343 Hypertrophy of nasal turbinates: Secondary | ICD-10-CM | POA: Diagnosis not present

## 2020-10-31 DIAGNOSIS — Z9889 Other specified postprocedural states: Secondary | ICD-10-CM | POA: Diagnosis not present

## 2020-11-18 DIAGNOSIS — Z9889 Other specified postprocedural states: Secondary | ICD-10-CM | POA: Diagnosis not present

## 2020-11-18 DIAGNOSIS — R0982 Postnasal drip: Secondary | ICD-10-CM | POA: Diagnosis not present

## 2020-11-18 DIAGNOSIS — Z48813 Encounter for surgical aftercare following surgery on the respiratory system: Secondary | ICD-10-CM | POA: Diagnosis not present

## 2020-11-22 ENCOUNTER — Telehealth: Payer: Self-pay | Admitting: *Deleted

## 2020-11-22 ENCOUNTER — Encounter: Payer: Self-pay | Admitting: Obstetrics & Gynecology

## 2020-11-22 ENCOUNTER — Other Ambulatory Visit: Payer: Self-pay

## 2020-11-22 ENCOUNTER — Ambulatory Visit: Payer: Medicare Other | Admitting: Obstetrics & Gynecology

## 2020-11-22 VITALS — BP 110/74 | HR 65 | Resp 16 | Ht 62.0 in | Wt 148.0 lb

## 2020-11-22 DIAGNOSIS — Z01419 Encounter for gynecological examination (general) (routine) without abnormal findings: Secondary | ICD-10-CM

## 2020-11-22 DIAGNOSIS — Z780 Asymptomatic menopausal state: Secondary | ICD-10-CM

## 2020-11-22 DIAGNOSIS — N9089 Other specified noninflammatory disorders of vulva and perineum: Secondary | ICD-10-CM

## 2020-11-22 DIAGNOSIS — M8589 Other specified disorders of bone density and structure, multiple sites: Secondary | ICD-10-CM | POA: Diagnosis not present

## 2020-11-22 MED ORDER — CLOBETASOL PROPIONATE 0.05 % EX CREA: 1.0000 "application " | TOPICAL_CREAM | Freq: Two times a day (BID) | CUTANEOUS | 1 refills | Status: AC

## 2020-11-22 NOTE — Progress Notes (Signed)
Leah Reeves [birth year] [id number]   History:    70 y.o.  G0 Married   RP:  Established patient presenting for annual gyn exam   HPI: Postmenopause, well on no hormone replacement therapy.  No postmenopausal bleeding.  Dryness with intercourse.  Better with new lubricant.  Urine and bowel movements normal.  Breast normal.  Body mass index 27.7.  Keeping in good fitness, walking the dog.  Healthy nutrition.  Health labs with family physician.  Colonoscopy 2016    Past medical history,surgical history, family history and social history were all reviewed and documented in the EPIC chart.  Gynecologic History Patient's last menstrual period was 05/25/2008.   Obstetric History OB History  Gravida Para Term Preterm AB Living  0 0 0 0 0 0  SAB IAB Ectopic Multiple Live Births  0 0 0 0 0     ROS: A ROS was performed and pertinent positives and negatives are included in the history.  GENERAL: No fevers or chills. HEENT: No change in vision, no earache, sore throat or sinus congestion. NECK: No pain or stiffness. CARDIOVASCULAR: No chest pain or pressure. No palpitations. PULMONARY: No shortness of breath, cough or wheeze. GASTROINTESTINAL: No abdominal pain, nausea, vomiting or diarrhea, melena or bright red blood per rectum. GENITOURINARY: No urinary frequency, urgency, hesitancy or dysuria. MUSCULOSKELETAL: No joint or muscle pain, no back pain, no recent trauma. DERMATOLOGIC: No rash, no itching, no lesions. ENDOCRINE: No polyuria, polydipsia, no heat or cold intolerance. No recent change in weight. HEMATOLOGICAL: No anemia or easy bruising or bleeding. NEUROLOGIC: No headache, seizures, numbness, tingling or weakness. PSYCHIATRIC: No depression, no loss of interest in normal activity or change in sleep pattern.     Exam:   BP 110/74   Pulse 65   Resp 16   Ht 5\' 2"  (1.575 m)   Wt 148 lb (67.1 kg)   LMP 05/25/2008   BMI 27.07 kg/m   Body mass index is 27.07 kg/m.  General  appearance : Well developed well nourished female. No acute distress HEENT: Eyes: no retinal hemorrhage or exudates,  Neck supple, trachea midline, no carotid bruits, no thyroidmegaly Lungs: Clear to auscultation, no rhonchi or wheezes, or rib retractions  Heart: Regular rate and rhythm, no murmurs or gallops Breast:Examined in sitting and supine position were symmetrical in appearance, no palpable masses or tenderness,  no skin retraction, no nipple inversion, no nipple discharge, no skin discoloration, no axillary or supraclavicular lymphadenopathy Abdomen: no palpable masses or tenderness, no rebound or guarding Extremities: no edema or skin discoloration or tenderness  Pelvic: Vulva: Irritation, white atrophy anteriorly and posteriorly             Vagina: No gross lesions or discharge  Cervix: No gross lesions or discharge  Uterus  AV, normal size, shape and consistency, non-tender and mobile  Adnexa  Without masses or tenderness  Anus: Normal   Assessment/Plan:  70 y.o. female for annual exam   1. Well female exam with routine gynecological exam  2. Postmenopause  3. Osteopenia of multiple sites Osteopenia on BD 11/2018 T-Score -2.2 at Rt Femoral Neck.  Repeat BD this year.  Vit D supplement, Ca++ 1.5 g/d total, weight bearing physical activities. - DG Bone Density; Future  4. Vulvar irritation No vulvar itching.  White atrophy with inflammation secondary to irritation from mild SUI or beginning of Lichen sclerosus.  Will treat with Clobetasol 0.05% cream twice a day x 14 days.  Reassess the  need for long term treatment after that.  Other orders - fluticasone (FLONASE) 50 MCG/ACT nasal spray; 2 sprays by Each Nare route daily. - clobetasol cream (TEMOVATE) 0.05 %; Apply 1 application topically 2 (two) times daily for 14 days.   Princess Bruins MD, 9:47 AM 11/22/2020

## 2020-11-22 NOTE — Telephone Encounter (Signed)
-----   Message from Sinclair Grooms sent at 11/22/2020 11:54 AM EDT ----- Regarding: RE: Due for Bone Density in 12/2020 Patient will call back to schedule ----- Message ----- From: Thamas Jaegers, RMA Sent: 11/22/2020  11:31 AM EDT To: [facility] Appointments Subject: FW: Due for Bone Density in 12/2020             Dr.Lavoie placed the order.   Can someone call her to schedule her dexa here.  ----- Message ----- From: Princess Bruins, MD Sent: 11/22/2020  10:12 AM EDT To: Thamas Jaegers, RMA Subject: Due for Bone Density in 12/2020                 Osteopenia BD 11/2018.  Please inform patient that she needs to schedule BD.

## 2020-12-04 DIAGNOSIS — I480 Paroxysmal atrial fibrillation: Secondary | ICD-10-CM | POA: Diagnosis not present

## 2020-12-04 DIAGNOSIS — E785 Hyperlipidemia, unspecified: Secondary | ICD-10-CM | POA: Diagnosis not present

## 2020-12-04 DIAGNOSIS — J449 Chronic obstructive pulmonary disease, unspecified: Secondary | ICD-10-CM | POA: Diagnosis not present

## 2020-12-16 DIAGNOSIS — J3489 Other specified disorders of nose and nasal sinuses: Secondary | ICD-10-CM | POA: Diagnosis not present

## 2020-12-16 DIAGNOSIS — J320 Chronic maxillary sinusitis: Secondary | ICD-10-CM | POA: Diagnosis not present

## 2020-12-17 DIAGNOSIS — J320 Chronic maxillary sinusitis: Secondary | ICD-10-CM | POA: Diagnosis not present

## 2020-12-26 ENCOUNTER — Other Ambulatory Visit: Payer: Self-pay | Admitting: Internal Medicine

## 2020-12-26 DIAGNOSIS — Z1231 Encounter for screening mammogram for malignant neoplasm of breast: Secondary | ICD-10-CM

## 2020-12-31 DIAGNOSIS — I480 Paroxysmal atrial fibrillation: Secondary | ICD-10-CM | POA: Diagnosis not present

## 2020-12-31 DIAGNOSIS — J449 Chronic obstructive pulmonary disease, unspecified: Secondary | ICD-10-CM | POA: Diagnosis not present

## 2020-12-31 DIAGNOSIS — E785 Hyperlipidemia, unspecified: Secondary | ICD-10-CM | POA: Diagnosis not present

## 2021-01-28 DIAGNOSIS — J320 Chronic maxillary sinusitis: Secondary | ICD-10-CM | POA: Diagnosis not present

## 2021-02-14 ENCOUNTER — Ambulatory Visit: Payer: Medicare Other

## 2021-03-18 ENCOUNTER — Ambulatory Visit
Admission: RE | Admit: 2021-03-18 | Discharge: 2021-03-18 | Disposition: A | Discharge status: Home / Self Care | Payer: Medicare Other | Source: Ambulatory Visit | Attending: Internal Medicine | Admitting: Internal Medicine

## 2021-03-18 ENCOUNTER — Other Ambulatory Visit: Payer: Self-pay

## 2021-03-18 DIAGNOSIS — Z1231 Encounter for screening mammogram for malignant neoplasm of breast: Secondary | ICD-10-CM

## 2021-03-20 DIAGNOSIS — J320 Chronic maxillary sinusitis: Secondary | ICD-10-CM | POA: Diagnosis not present

## 2021-03-21 ENCOUNTER — Other Ambulatory Visit: Payer: Self-pay | Admitting: Internal Medicine

## 2021-03-21 DIAGNOSIS — R928 Other abnormal and inconclusive findings on diagnostic imaging of breast: Secondary | ICD-10-CM

## 2021-03-26 ENCOUNTER — Ambulatory Visit
Admission: RE | Admit: 2021-03-26 | Discharge: 2021-03-26 | Disposition: A | Discharge status: Home / Self Care | Payer: Medicare Other | Source: Ambulatory Visit | Attending: Internal Medicine | Admitting: Internal Medicine

## 2021-03-26 ENCOUNTER — Ambulatory Visit: Payer: Medicare Other

## 2021-03-26 ENCOUNTER — Other Ambulatory Visit: Payer: Self-pay

## 2021-03-26 DIAGNOSIS — R922 Inconclusive mammogram: Secondary | ICD-10-CM | POA: Diagnosis not present

## 2021-03-26 DIAGNOSIS — R928 Other abnormal and inconclusive findings on diagnostic imaging of breast: Secondary | ICD-10-CM

## 2021-04-04 DIAGNOSIS — I480 Paroxysmal atrial fibrillation: Secondary | ICD-10-CM | POA: Diagnosis not present

## 2021-04-04 DIAGNOSIS — J449 Chronic obstructive pulmonary disease, unspecified: Secondary | ICD-10-CM | POA: Diagnosis not present

## 2021-04-04 DIAGNOSIS — E785 Hyperlipidemia, unspecified: Secondary | ICD-10-CM | POA: Diagnosis not present

## 2021-05-02 DIAGNOSIS — J301 Allergic rhinitis due to pollen: Secondary | ICD-10-CM | POA: Diagnosis not present

## 2021-05-02 DIAGNOSIS — J329 Chronic sinusitis, unspecified: Secondary | ICD-10-CM | POA: Diagnosis not present

## 2021-07-10 DIAGNOSIS — Z0000 Encounter for general adult medical examination without abnormal findings: Secondary | ICD-10-CM | POA: Diagnosis not present

## 2021-07-10 DIAGNOSIS — E785 Hyperlipidemia, unspecified: Secondary | ICD-10-CM | POA: Diagnosis not present

## 2021-07-10 DIAGNOSIS — Z1211 Encounter for screening for malignant neoplasm of colon: Secondary | ICD-10-CM | POA: Diagnosis not present

## 2021-07-10 DIAGNOSIS — I480 Paroxysmal atrial fibrillation: Secondary | ICD-10-CM | POA: Diagnosis not present

## 2021-07-10 DIAGNOSIS — Z1231 Encounter for screening mammogram for malignant neoplasm of breast: Secondary | ICD-10-CM | POA: Diagnosis not present

## 2021-07-10 DIAGNOSIS — Z23 Encounter for immunization: Secondary | ICD-10-CM | POA: Diagnosis not present

## 2021-07-10 DIAGNOSIS — M8588 Other specified disorders of bone density and structure, other site: Secondary | ICD-10-CM | POA: Diagnosis not present

## 2021-07-10 DIAGNOSIS — J301 Allergic rhinitis due to pollen: Secondary | ICD-10-CM | POA: Diagnosis not present

## 2021-07-10 DIAGNOSIS — D329 Benign neoplasm of meninges, unspecified: Secondary | ICD-10-CM | POA: Diagnosis not present

## 2021-07-10 DIAGNOSIS — Z1159 Encounter for screening for other viral diseases: Secondary | ICD-10-CM | POA: Diagnosis not present

## 2021-07-11 ENCOUNTER — Other Ambulatory Visit: Payer: Self-pay | Admitting: Internal Medicine

## 2021-07-11 DIAGNOSIS — D329 Benign neoplasm of meninges, unspecified: Secondary | ICD-10-CM

## 2021-07-16 ENCOUNTER — Other Ambulatory Visit: Payer: Self-pay | Admitting: Internal Medicine

## 2021-07-16 DIAGNOSIS — M8588 Other specified disorders of bone density and structure, other site: Secondary | ICD-10-CM

## 2021-07-29 ENCOUNTER — Other Ambulatory Visit: Payer: Self-pay | Admitting: Cardiology

## 2021-07-29 DIAGNOSIS — I480 Paroxysmal atrial fibrillation: Secondary | ICD-10-CM

## 2021-07-29 NOTE — Telephone Encounter (Addendum)
Eliquis '5mg'$  refill request received. Patient is 71 years old, weight-67.1kg, Crea-0.78 on 2/16/203 via [facility] form [facility], Diagnosis-AFIB, and last seen by Dr. Marlou Reeves on 02/22/2020-NEEDS APPT. Dose is appropriate based on dosing criteria.  ? ?Pt needs an appt with Dr. Marlou Reeves, last seen 2021; will send a message to schedulers.  ? ?Schedulers reached out to the pt and there was no answer, they left a message.  ?

## 2021-07-29 NOTE — Telephone Encounter (Incomplete Revision)
Eliquis '5mg'$  refill request received. Patient is 71 years old, weight-67.1kg, Crea-0.78 on 2/16/203 via [facility] form [facility], Diagnosis-AFIB, and last seen by Dr. Marlou Porch on 02/22/2020-NEEDS APPT. Dose is appropriate based on dosing criteria.   Pt needs an appt with Dr. Marlou Porch, last seen 2021; will send a message to schedulers.   Schedulers reached out to the pt and there was no answer, they left a message.

## 2021-07-30 NOTE — Telephone Encounter (Signed)
Prescription refill request for Eliquis received. ?Indication: Afib ?Last office visit: 02/22/20 Marlou Porch) ?Scr: Crea-0.78 on 2/16/203 via [facility] form [facility] ?Age: 71 ?Weight: 67.1kg ? ?Pt has scheduled appt with Dr Marlou Porch on 09/23/21. Appropriate dose and refill sent to requested pharmacy.  ?

## 2021-08-02 ENCOUNTER — Ambulatory Visit
Admission: RE | Admit: 2021-08-02 | Discharge: 2021-08-02 | Disposition: A | Discharge status: Home / Self Care | Payer: Medicare Other | Source: Ambulatory Visit | Attending: Internal Medicine | Admitting: Internal Medicine

## 2021-08-02 DIAGNOSIS — E236 Other disorders of pituitary gland: Secondary | ICD-10-CM | POA: Diagnosis not present

## 2021-08-02 DIAGNOSIS — D329 Benign neoplasm of meninges, unspecified: Secondary | ICD-10-CM

## 2021-08-02 DIAGNOSIS — R220 Localized swelling, mass and lump, head: Secondary | ICD-10-CM | POA: Diagnosis not present

## 2021-08-02 MED ORDER — GADOBENATE DIMEGLUMINE 529 MG/ML IV SOLN
12.0000 mL | Freq: Once | INTRAVENOUS | Status: AC | PRN
  Administered 2021-08-02: 12 mL via INTRAVENOUS

## 2021-08-19 DIAGNOSIS — H43811 Vitreous degeneration, right eye: Secondary | ICD-10-CM | POA: Diagnosis not present

## 2021-09-22 NOTE — Progress Notes (Signed)
Cardiology Office Note:    Date:  09/23/2021   ID:  Leah Reeves, DOB 1950-09-30, MRN [id number]  PCP:  Lorenda Ishihara, MD  [facility] Cardiologist:  Donato Schultz, MD  [facility] Electrophysiologist:  None   Referring MD: Lorenda Ishihara,*    History of Present Illness:    Leah Reeves is a 71 y.o. female here for the follow-up of paroxysmal atrial fibrillation.  Had prior atrial fibrillation paroxysmal started in June 2019 during a stressful situation.  Other possible triggers are coffee, anxiety.  Has chronic anticoagulation with Eliquis.  At her last appointment she seemed to be doing quite well. She was remembering some of the evening doses of Eliquis previously. No chest pain or shortness of breath at that visit.  Unfortunately, her sister had foot surgery after injury and died about a week later while in rehab facility.  Today: To her knowledge she has not had any recurrent arrhythmias.  She remains compliant with Eliquis; no bleeding issues. We discussed the Watchman procedure as a potential alternative, but she feels she is doing well on Eliquis.  No problems on simvastatin.  She denies any palpitations, chest pain, shortness of breath, or peripheral edema. No lightheadedness, headaches, syncope, orthopnea, or PND.   Past Medical History:  Diagnosis Date   Anxiety    Arthritis    knees -generalized   Depression    Headache    past history - none at present   Heart murmur    Osteopenia    Perimenopausal vasomotor symptoms    Vertigo    tx. 6 months ago- no problems now,very mild occ.    Past Surgical History:  Procedure Laterality Date   BREAST CYST ASPIRATION Right 2006   COLONOSCOPY WITH PROPOFOL N/A 09/18/2014   Procedure: COLONOSCOPY WITH PROPOFOL;  Surgeon: Charolett Bumpers, MD;  Location: [facility];  Service: Endoscopy;  Laterality: N/A;   NASAL SINUS SURGERY      Current Medications: Current Meds  Medication Sig    ALPRAZolam (XANAX) 0.25 MG tablet Take 0.25 mg by mouth 2 (two) times daily as needed for anxiety.   apixaban (ELIQUIS) 5 MG TABS tablet TAKE 1 TABLET BY MOUTH  TWICE DAILY   buPROPion (WELLBUTRIN XL) 150 MG 24 hr tablet Take 150 mg by mouth every morning.   cholecalciferol (VITAMIN D) 1000 units tablet Take 1,000 Units by mouth daily.   diltiazem (CARDIZEM) 30 MG tablet Take 1 tablet (30 mg total) by mouth 4 (four) times daily as needed.   FLUoxetine (PROZAC) 20 MG capsule Take 40 mg by mouth every morning.   fluticasone (FLONASE) 50 MCG/ACT nasal spray 2 sprays by Each Nare route daily.   loratadine (CLARITIN) 10 MG tablet Take 10 mg by mouth daily.   simvastatin (ZOCOR) 40 MG tablet Take 40 mg by mouth every morning.     Allergies:   Codeine and Azithromycin   Social History   Socioeconomic History   Marital status: Married    Spouse name: Not on file   Number of children: Not on file   Years of education: Not on file   Highest education level: Not on file  Occupational History   Not on file  Tobacco Use   Smoking status: Former    Packs/day: 0.50    Years: 3.00    Pack years: 1.50    Types: Cigarettes    Quit date: 05/26/1991    Years since quitting: 30.3   Smokeless tobacco: Never  Vaping Use   Vaping Use: Never used  Substance and Sexual Activity   Alcohol use: Not Currently   Drug use: No   Sexual activity: Yes    Partners: Male    Birth control/protection: Post-menopausal    Comment: 1st intercourse- 23, partners- 3, married- 27 yrs   Other Topics Concern   Not on file  Social History Narrative   Not on file   Social Determinants of Health   Financial Resource Strain: Not on file  Food Insecurity: Not on file  Transportation Needs: Not on file  Physical Activity: Not on file  Stress: Not on file  Social Connections: Not on file     Family History: The patient's family history includes Diabetes in her mother and sister; Heart disease in her father and  sister; Hypertension in her mother.  ROS:   Please see the history of present illness.    All other systems reviewed and are negative.  EKGs/Labs/Other Studies Reviewed:    The following studies were reviewed today:  CTA Neck 05/01/2019: IMPRESSION: The bilateral common carotid, internal carotid and vertebral arteries are patent within the neck without significant stenosis. No evidence of traumatic vascular injury. Mild atherosclerotic disease within the proximal left ICA.   Variant anatomy of the V1 right vertebral artery as described.   Please refer to concurrent cervical spine CT for a description of cervical spine findings.  Echocardiogram 11/05/2017: Study Conclusions:   - Left ventricle: The cavity size was normal. Wall thickness was    normal. Systolic function was normal. The estimated ejection    fraction was in the range of 60% to 65%. Wall motion was normal;    there were no regional wall motion abnormalities. Features are    consistent with a pseudonormal left ventricular filling pattern,    with concomitant abnormal relaxation and increased filling    pressure (grade 2 diastolic dysfunction). GLS -22.6% (normal).  - Aortic valve: There was no stenosis.  - Mitral valve: There was trivial regurgitation.  - Left atrium: The atrium was mildly dilated.  - Right ventricle: The cavity size was normal. Systolic function    was normal.  - Tricuspid valve: Peak RV-RA gradient (S): 29 mm Hg.  - Pulmonary arteries: PA peak pressure: 32 mm Hg (S).  - Inferior vena cava: The vessel was normal in size. The    respirophasic diameter changes were in the normal range (>= 50%),    consistent with normal central venous pressure.  - Pericardium, extracardiac: A trivial pericardial effusion was    identified.   Impressions:   - Normal LV size with EF 60-65%. Moderate diastolic dysfunction.    Normal RV size and systolic function. No significant valvular    abnormalities.    EKG:  EKG is personally reviewed. 09/23/2021: Sinus rhythm. Rate 62 bpm. 02/22/2020: sinus rhythm 60 no other changes.  Recent Labs: No results found for requested labs within last 8760 hours.   Recent Lipid Panel    Component Value Date/Time   CHOL 170 11/09/2011 0952   HDL 43 11/09/2011 0952    Physical Exam:    VS:  BP 110/62   Pulse 62   Ht 5\' 2"  (1.575 m)   Wt 145 lb 9.6 oz (66 kg)   LMP 05/25/2008   SpO2 97%   BMI 26.63 kg/m     Wt Readings from Last 3 Encounters:  09/23/21 145 lb 9.6 oz (66 kg)  11/22/20 148 lb (67.1 kg)  02/22/20  161 lb (73 kg)     GEN:  Well nourished, well developed in no acute distress HEENT: Normal NECK: No JVD; No carotid bruits LYMPHATICS: No lymphadenopathy CARDIAC: RRR, 1/6 systolic right upper sternal border murmur, no rubs, gallops RESPIRATORY:  Clear to auscultation without rales, wheezing or rhonchi  ABDOMEN: Soft, non-tender, non-distended MUSCULOSKELETAL:  No edema; No deformity  SKIN: Warm and dry NEUROLOGIC:  Alert and oriented x 3 PSYCHIATRIC:  Normal affect   ASSESSMENT:    1. Paroxysmal atrial fibrillation (HCC)   2. Chronic anticoagulation   3. Pure hypercholesterolemia     PLAN:    Paroxysmal atrial fibrillation (HCC) -Doing well no recent occurrences.  No changes made to medications.  Has not needed to use any diltiazem.  Doing well.  No palpitations.  Chronic anticoagulation Doing well with Eliquis.  Numbness in diagnosis.  Compliant.  No bleeding.  Did discuss potential Watchman in the future as an alternative if absolutely necessary.  Pure hypercholesterolemia -Continuing with simvastatin 40 mg.  LDL 98 at last check.  HDL 65.  TSH normal.    Follow-up:  1 year.  Medication Adjustments/Labs and Tests Ordered: Current medicines are reviewed at length with the patient today.  Concerns regarding medicines are outlined above.   Orders Placed This Encounter  Procedures   EKG 12-Lead   No orders of  the defined types were placed in this encounter.  Patient Instructions  Medication Instructions:  The current medical regimen is effective;  continue present plan and medications.  *If you need a refill on your cardiac medications before your next appointment, please call your pharmacy*  Follow-Up: At [facility], you and your health needs are our priority.  As part of our continuing mission to provide you with exceptional heart care, we have created designated Provider Care Teams.  These Care Teams include your primary Cardiologist (physician) and Advanced Practice Providers (APPs -  Physician Assistants and Nurse Practitioners) who all work together to provide you with the care you need, when you need it.  We recommend signing up for the patient portal called "MyChart".  Sign up information is provided on this After Visit Summary.  MyChart is used to connect with patients for Virtual Visits (Telemedicine).  Patients are able to view lab/test results, encounter notes, upcoming appointments, etc.  Non-urgent messages can be sent to your provider as well.   To learn more about what you can do with MyChart, go to [email or web address].    Your next appointment:   1 year(s)  The format for your next appointment:   In Person  Provider:   Donato Schultz, MD {   Important Information About Sugar         I,Mathew Stumpf,acting as a scribe for Donato Schultz, MD.,have documented all relevant documentation on the behalf of Donato Schultz, MD,as directed by  Donato Schultz, MD while in the presence of Donato Schultz, MD.  I, Donato Schultz, MD, have reviewed all documentation for this visit. The documentation on 09/23/21 for the exam, diagnosis, procedures, and orders are all accurate and complete.   Signed, Donato Schultz, MD  09/23/2021 4:02 PM    [facility]

## 2021-09-23 ENCOUNTER — Ambulatory Visit: Payer: Medicare Other | Admitting: Cardiology

## 2021-09-23 ENCOUNTER — Encounter: Payer: Self-pay | Admitting: Cardiology

## 2021-09-23 VITALS — BP 110/62 | HR 62 | Ht 62.0 in | Wt 145.6 lb

## 2021-09-23 DIAGNOSIS — E7800 Pure hypercholesterolemia, unspecified: Secondary | ICD-10-CM | POA: Diagnosis not present

## 2021-09-23 DIAGNOSIS — Z7901 Long term (current) use of anticoagulants: Secondary | ICD-10-CM

## 2021-09-23 DIAGNOSIS — I480 Paroxysmal atrial fibrillation: Secondary | ICD-10-CM

## 2021-09-23 NOTE — Assessment & Plan Note (Signed)
-  Doing well no recent occurrences.  No changes made to medications.  Has not needed to use any diltiazem.  Doing well.  No palpitations. ?

## 2021-09-23 NOTE — Assessment & Plan Note (Signed)
Doing well with Eliquis.  Numbness in diagnosis.  Compliant.  No bleeding.  Did discuss potential Watchman in the future as an alternative if absolutely necessary. ?

## 2021-09-23 NOTE — Assessment & Plan Note (Signed)
-  Continuing with simvastatin 40 mg.  LDL 98 at last check.  HDL 65.  TSH normal. ?

## 2021-09-23 NOTE — Patient Instructions (Signed)

## 2021-09-29 DIAGNOSIS — I480 Paroxysmal atrial fibrillation: Secondary | ICD-10-CM | POA: Diagnosis not present

## 2021-09-29 DIAGNOSIS — E785 Hyperlipidemia, unspecified: Secondary | ICD-10-CM | POA: Diagnosis not present

## 2021-09-30 ENCOUNTER — Encounter: Payer: Self-pay | Admitting: Allergy & Immunology

## 2021-09-30 ENCOUNTER — Ambulatory Visit: Payer: Medicare Other | Admitting: Allergy & Immunology

## 2021-09-30 VITALS — BP 140/80 | HR 63 | Temp 98.2°F | Resp 16 | Ht 61.5 in | Wt 144.6 lb

## 2021-09-30 DIAGNOSIS — J3089 Other allergic rhinitis: Secondary | ICD-10-CM

## 2021-09-30 DIAGNOSIS — J302 Other seasonal allergic rhinitis: Secondary | ICD-10-CM

## 2021-09-30 DIAGNOSIS — J45909 Unspecified asthma, uncomplicated: Secondary | ICD-10-CM | POA: Insufficient documentation

## 2021-09-30 NOTE — Progress Notes (Signed)
? ?NEW PATIENT ? ?Date of Service/Encounter:  09/30/21 ? ?Consult requested by: Leeroy Cha, MD ? ? ?Assessment:  ? ?Seasonal and perennial allergic rhinitis (indoor molds, outdoor molds, dust mites, cat, and dog) ? ?Plan/Recommendations:  ? ? ?1. Chronic rhinitis ?- Testing today showed: indoor molds, outdoor molds, dust mites, cat, and dog. ?- Copy of test results provided.  ?- Avoidance measures provided. ?- Stop taking: all of your current medications ?- Start taking: Xyzal (levocetirizine) '5mg'$  tablet once daily and Ryaltris (olopatadine/mometasone) two sprays per nostril 1-2 times daily as needed ?- You can use an extra dose of the antihistamine, if needed, for breakthrough symptoms.  ?- Consider nasal saline rinses 1-2 times daily to remove allergens from the nasal cavities as well as help with mucous clearance (this is especially helpful to do before the nasal sprays are given) ?- Consider allergy shots as a means of long-term control. ?- Allergy shots "re-train" and "reset" the immune system to ignore environmental allergens and decrease the resulting immune response to those allergens (sneezing, itchy watery eyes, runny nose, nasal congestion, etc).    ?- Allergy shots improve symptoms in 75-85% of patients.  ?- We can discuss more at the next appointment if the medications are not working for you. ? ?2. Return in about 6 months (around 04/02/2022).  ? ? ? ?This note in its entirety was forwarded to the Provider who requested this consultation. ? ?Subjective:  ? ?Leah Reeves is a 71 y.o. female presenting today for evaluation of  ?Chief Complaint  ?Patient presents with  ? Allergy Testing  ? Allergic Rhinitis   ?  Had Sinus Surgery. Still having a lot of mucous all day long. Been going on for 3 years now. Mucous color can range from beige/grey/green. It is thick most of the time. Been to pulmonology and ENT now this office. Nothing has helped: antihistamines, nasal sprays, nor sinus rinses  that she does every morning.  ? Other  ?  Uses [facility] for regular established medications and [facility] for temporary medications.   ? ? ?Leah Reeves has a history of the following: ?Patient Active Problem List  ? Diagnosis Date Noted  ? Asthma 09/30/2021  ? Seasonal and perennial allergic rhinitis 09/30/2021  ? Chronic anticoagulation 02/23/2018  ? Paroxysmal atrial fibrillation ([place]) 02/23/2018  ? Pure hypercholesterolemia 02/23/2018  ? Palpitations 10/26/2017  ? Cough variant asthma vs uacs  09/09/2017  ? PULMONARY INFILTRATE INCLUDES (EOSINOPHILIA) 05/04/2008  ? ? ?History obtained from: chart review and patient. ? ?Leah Reeves was referred by Leeroy Cha, MD.    ? ?Leah Reeves is a 71 y.o. female presenting for an evaluation of sinus drainage . ?  ?Asthma/Respiratory Symptom History: She has a history of bronchitis.  Symptoms started with coughing which was needed to get the mucous up. She was on inhalers for a period of time, but she was having palpitations so she stopped them completely. Then she was sent to ENT where she had surgery performed. She was followed by Dr. Melvyn Novas. Wheezing and SOB is no longer a problem. This was more of a problem as a child.  ? ?Allergic Rhinitis Symptom History: She has been having sinus drainage for 3-4 years.  She was see by Dr. Redmond Baseman and had surgery performed. This was in 2022 sometime. She does get prednisone and antibiotics for her symptoms; she reports 1-2 times per year. Symptoms are bad throughout the year. It is not a seasonal pattern at all. She  has tried Claritin, Zyrtec, Flonase, all without relief.  ? ?She does have large knots from mosquitos. This is the only thing that she notices that bothers her.  ? ?Her husband had surgery yesterday, including 4 vertebrae. He is being released today.  ? 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 Then she was asked to work part time during [employer]. She was only retired for 6 months  before she was asked to come back. Now she is retired again.  ? ?Otherwise, there is no history of other atopic diseases, including food allergies, drug allergies, stinging insect allergies, eczema, urticaria, or contact dermatitis. There is no significant infectious history. Vaccinations are up to date.  ? ? ?Past Medical History: ?Patient Active Problem List  ? Diagnosis Date Noted  ? Asthma 09/30/2021  ? Seasonal and perennial allergic rhinitis 09/30/2021  ? Chronic anticoagulation 02/23/2018  ? Paroxysmal atrial fibrillation ([place]) 02/23/2018  ? Pure hypercholesterolemia 02/23/2018  ? Palpitations 10/26/2017  ? Cough variant asthma vs uacs  09/09/2017  ? PULMONARY INFILTRATE INCLUDES (EOSINOPHILIA) 05/04/2008  ? ? ?Medication List:  ?Allergies as of 09/30/2021   ? ?   Reactions  ? Codeine Nausea And Vomiting  ? Azithromycin Rash  ? ?  ? ?  ?Medication List  ?  ? ?  ? Accurate as of Sep 30, 2021  1:17 PM. If you have any questions, ask your nurse or doctor.  ?  ?  ? ?  ? ?ALPRAZolam 0.25 MG tablet ?Commonly known as: Duanne Moron ?Take 0.25 mg by mouth 2 (two) times daily as needed for anxiety. ?  ?buPROPion 150 MG 24 hr tablet ?Commonly known as: WELLBUTRIN XL ?Take 150 mg by mouth every morning. ?  ?cholecalciferol 1000 units tablet ?Commonly known as: VITAMIN D ?Take 1,000 Units by mouth daily. ?  ?diltiazem 30 MG tablet ?Commonly known as: CARDIZEM ?Take 1 tablet (30 mg total) by mouth 4 (four) times daily as needed. ?  ?Eliquis 5 MG Tabs tablet ?Generic drug: apixaban ?TAKE 1 TABLET BY MOUTH  TWICE DAILY ?  ?FLUoxetine 20 MG capsule ?Commonly known as: PROZAC ?Take 40 mg by mouth every morning. ?  ?fluticasone 50 MCG/ACT nasal spray ?Commonly known as: FLONASE ?2 sprays by Each Nare route daily. ?  ?loratadine 10 MG tablet ?Commonly known as: CLARITIN ?Take 10 mg by mouth daily. ?  ?simvastatin 40 MG tablet ?Commonly known as: ZOCOR ?Take 40 mg by mouth every morning. ?  ? ?  ? ? ?Birth History:  non-contributory ? ?Developmental History: non-contributory ? ?Past Surgical History: ?Past Surgical History:  ?Procedure Laterality Date  ? BREAST CYST ASPIRATION Right 2006  ? COLONOSCOPY WITH PROPOFOL N/A 09/18/2014  ? Procedure: COLONOSCOPY WITH PROPOFOL;  Surgeon: Garlan Fair, MD;  Location: [facility];  Service: Endoscopy;  Laterality: N/A;  ? NASAL SINUS SURGERY    ? ? ? ?Family History: ?Family History  ?Problem Relation Age of Onset  ? Diabetes Mother   ? Hypertension Mother   ? Heart disease Father   ? Diabetes Sister   ? Heart disease Sister   ? ? ? ?Social History: Sebastiana lives at home with her husband. She mostly takes care of her husband and is busy with church activities. They have one step son and three grandchildren.  They live in a house that is 56 years old.  There is hardwood and rugs in the main living area and carpeting in the bedroom.  They  have electric heating and central cooling.  There is 1 dog inside of the home.  There are dust mite covers on the bed, but not the pillows.  There is no tobacco exposure.  She is currently retired.  She is not exposed to fumes, chemicals, or dust.  There is no HEPA filter in the home.  They do not live near an interstate or industrial area. ? ? ?Review of Systems  ?Constitutional: Negative.  Negative for fever, malaise/fatigue and weight loss.  ?HENT:  Positive for congestion and sinus pain. Negative for ear discharge and ear pain.   ?Eyes:  Negative for pain, discharge and redness.  ?Respiratory:  Negative for cough, sputum production, shortness of breath and wheezing.   ?Cardiovascular: Negative.  Negative for chest pain and palpitations.  ?Gastrointestinal:  Negative for abdominal pain, heartburn, nausea and vomiting.  ?Skin: Negative.  Negative for itching and rash.  ?Neurological:  Negative for dizziness and headaches.  ?Endo/Heme/Allergies:  Positive for environmental allergies. Does not bruise/bleed easily.   ? ? ? ?Objective:  ? ?Blood  pressure 140/80, pulse 63, temperature 98.2 ?F (36.8 ?C), temperature source Temporal, resp. rate 16, height 5' 1.5" (1.562 m), weight 144 lb 9.6 oz (65.6 kg), last menstrual period 05/25/2008, SpO2 97 %. ?Body mass index is 26.88 k

## 2021-09-30 NOTE — Patient Instructions (Addendum)
1. Chronic rhinitis ?- Testing today showed: indoor molds, outdoor molds, dust mites, cat, and dog. ?- Copy of test results provided.  ?- Avoidance measures provided. ?- Stop taking: all of your current medications ?- Start taking: Xyzal (levocetirizine) '5mg'$  tablet once daily and Ryaltris (olopatadine/mometasone) two sprays per nostril 1-2 times daily as needed ?- You can use an extra dose of the antihistamine, if needed, for breakthrough symptoms.  ?- Consider nasal saline rinses 1-2 times daily to remove allergens from the nasal cavities as well as help with mucous clearance (this is especially helpful to do before the nasal sprays are given) ?- Consider allergy shots as a means of long-term control. ?- Allergy shots "re-train" and "reset" the immune system to ignore environmental allergens and decrease the resulting immune response to those allergens (sneezing, itchy watery eyes, runny nose, nasal congestion, etc).    ?- Allergy shots improve symptoms in 75-85% of patients.  ?- We can discuss more at the next appointment if the medications are not working for you. ? ?2. Return in about 6 months (around 04/02/2022).  ? ? ?Please inform us of any Emergency Department visits, hospitalizations, or changes in symptoms. Call us before going to the ED for breathing or allergy symptoms since we might be able to fit you in for a sick visit. Feel free to contact us anytime with any questions, problems, or concerns. ? ?It was a pleasure to meet you today! ? ?Websites that have reliable patient information: ?1. American Academy of Asthma, Allergy, and Immunology: www.aaaai.org ?2. Food Allergy Research and Education (FARE): foodallergy.org ?3. Mothers of Asthmatics: http://www.asthmacommunitynetwork.org ?4. [employer] of Allergy, Asthma, and Immunology: MonthlyElectricBill.co.uk ? ? ?COVID-19 Vaccine Information can be found at: ShippingScam.co.uk For questions related to  vaccine distribution or appointments, please email [email or web address] or call [phone].  ? ?We realize that you might be concerned about having an allergic reaction to the COVID19 vaccines. To help with that concern, WE ARE OFFERING THE COVID19 VACCINES IN OUR OFFICE! Ask the front desk for dates!  ? ? ? ??Like? [place] on Facebook and Instagram for our latest updates!  ?  ? ? ?A healthy democracy works best when [employer] participate! Make sure you are registered to vote! If you have moved or changed any of your contact information, you will need to get this updated before voting! ? ?In some cases, you MAY be able to register to vote online: [email or web address] ? ? ? ? Airborne Adult Perc - 09/30/21 0900   ? ? Time Antigen Placed 0930   ? Allergen Manufacturer Lavella Hammock   ? Location Back   ? Number of Test 59   ? 1. Control-Buffer 50% Glycerol Negative   ? 2. Control-Histamine 1 mg/ml 2+   ? 3. Albumin saline Negative   ? 4. P[place] de [place] Negative   ? 5. [place] Negative   ? 6. Johnson Negative   ? 7. Naomi Blue Negative   ? 8. Meadow Fescue Negative   ? 9. Perennial Rye Negative   ? 10. Sweet Vernal Negative   ? 11. Timothy Negative   ? 12. Cocklebur Negative   ? 13. Burweed Marshelder Negative   ? 14. Ragweed, short Negative   ? 15. Ragweed, Giant Negative   ? 16. Plantain,  English Negative   ? 17. Lamb's Quarters Negative   ? 18. Sheep Sorrell Negative   ? 19. Rough Pigweed Negative   ? 20. Marsh Elder, Rough Negative   ? 21. Mugwort, Common Negative   ?  22. Ash mix Negative   ? 23. Wendee Copp mix Negative   ? 24. Beech American Negative   ? 25. Box, Elder Negative   ? 26. Cedar, red Negative   ? 27. Cottonwood, [place] Negative   ? 28. Elm mix Negative   ? 29. Hickory Negative   ? 30. Maple mix Negative   ? 31. Oak, [place] mix Negative   ? 32. Pecan Pollen Negative   ? 33. Pine mix Negative   ? 34. Sycamore Eastern Negative   ? 35. Walnut, Black Pollen Negative   ? 36. Alternaria alternata  Negative   ? 81. Cladosporium Herbarum Negative   ? 38. Aspergillus mix Negative   ? 39. Penicillium mix Negative   ? 40. Bipolaris sorokiniana (Helminthosporium) Negative   ? 41. Drechslera spicifera (Curvularia) Negative   ? 42. Mucor plumbeus Negative   ? 43. Fusarium moniliforme Negative   ? 44. Aureobasidium pullulans (pullulara) Negative   ? 45. Rhizopus oryzae Negative   ? 46. Botrytis cinera Negative   ? 47. Epicoccum nigrum Negative   ? 48. Phoma betae Negative   ? 49. Candida Albicans Negative   ? 50. Trichophyton mentagrophytes Negative   ? 51. Mite, D Farinae  5,000 AU/ml Negative   ? 52. Mite, D Pteronyssinus  5,000 AU/ml 2+   ? 53. Cat Hair 10,000 BAU/ml 3+   ? 54.  Dog Epithelia 2+   ? 55. Mixed Feathers Negative   ? 56. Horse Epithelia Negative   ? 57. Cockroach, [place] Negative   ? 58. Mouse Negative   ? 59. Tobacco Leaf Negative   ? ?  ?  ? ?  ? ? Intradermal - 09/30/21 0942   ? ? Time Antigen Placed --   ? Allergen Manufacturer --   ? Location --   ? Number of Test --   ? Intradermal --   error  ? Control Negative   ? [place] Negative   ? Johnson Negative   ? 7 Grass Negative   ? Ragweed mix Negative   ? Weed mix Negative   ? Tree mix Negative   ? Mold 1 2+   ? Mold 2 3+   ? Mold 3 2+   ? Mold 4 2+   ? Cockroach Negative   ? ?  ?  ? ?  ? ? ?Control of Mold Allergen  ? ?Mold and fungi can grow on a variety of surfaces provided certain temperature and moisture conditions exist.  Outdoor molds grow on plants, decaying vegetation and soil.  The major outdoor mold, Alternaria and Cladosporium, are found in very high numbers during hot and dry conditions.  Generally, a late Summer - Fall peak is seen for common outdoor fungal spores.  Rain will temporarily lower outdoor mold spore count, but counts rise rapidly when the rainy period ends.  The most important indoor molds are Aspergillus and Penicillium.  Dark, humid and poorly ventilated basements are ideal sites for mold growth.  The next most common  sites of mold growth are the bathroom and the kitchen. ? ?Outdoor (Seasonal) Mold Control ? ?Positive outdoor molds via skin testing: Alternaria, Cladosporium, Bipolaris (Helminthsporium), Drechslera (Curvalaria), and Mucor ? ?Use air conditioning and keep windows closed ?Avoid exposure to decaying vegetation. ?Avoid leaf raking. ?Avoid grain handling. ?Consider wearing a face mask if working in moldy areas.  ? ? ?Indoor (Perennial) Mold Control  ? ?Positive indoor molds via skin testing: Aspergillus, Penicillium, Fusarium, Aureobasidium (Pullulara),  and Rhizopus ? ?Maintain humidity below 50%. ?Clean washable surfaces with 5% bleach solution. ?Remove sources e.g. contaminated carpets. ? ? ? ?Control of Dog or Cat Allergen ? ?Avoidance is the best way to manage a dog or cat allergy. If you have a dog or cat and are allergic to dog or cats, consider removing the dog or cat from the home. ?If you have a dog or cat but don?t want to find it a new home, or if your family wants a pet even though someone in the household is allergic, here are some strategies that may help keep symptoms at bay: ? ?Keep the pet out of your bedroom and restrict it to only a few rooms. Be advised that keeping the dog or cat in only one room will not limit the allergens to that room. ?Don?t pet, hug or kiss the dog or cat; if you do, wash your hands with soap and water. ?High-efficiency particulate air (HEPA) cleaners run continuously in a bedroom or living room can reduce allergen levels over time. ?Regular use of a high-efficiency vacuum cleaner or a central vacuum can reduce allergen levels. ?Giving your dog or cat a bath at least once a week can reduce airborne allergen. ? ?Control of Dust Mite Allergen ? ? ? ?Dust mites play a major role in allergic asthma and rhinitis.  They occur in environments with high humidity wherever human skin is found.  Dust mites absorb humidity from the atmosphere (ie, they do not drink) and feed on organic  matter (including shed human and animal skin).  Dust mites are a microscopic type of insect that you cannot see with the naked eye.  High levels of dust mites have been detected from mattresses, pillows,

## 2021-11-26 ENCOUNTER — Ambulatory Visit: Payer: Medicare Other | Admitting: Obstetrics & Gynecology

## 2021-11-28 ENCOUNTER — Ambulatory Visit: Payer: Medicare Other | Admitting: Obstetrics & Gynecology

## 2021-12-03 NOTE — Patient Instructions (Incomplete)
Allergic rhinitis Continue allergen avoidance measures directed toward mold, dust mite, and pets as listed below Stop Xyzal as this did not relieve your symptoms Begin carbinoxamine 4 mg tablets. Take 1-2 tablets once or twice a day as needed for nasal symptoms.  Consider saline nasal rinses as needed for nasal symptoms. Use this before any medicated nasal sprays for best result Allergen immunotherapy codes provided. Call the clinic to set up your frst allergen immunotherapy appointment if interested.   Call the clinic if this treatment plan is not working well for you  Follow up in 6 months or sooner if needed.   Control of Dust Mite Allergen Dust mites play a major role in allergic asthma and rhinitis. They occur in environments with high humidity wherever human skin is found. Dust mites absorb humidity from the atmosphere (ie, they do not drink) and feed on organic matter (including shed human and animal skin). Dust mites are a microscopic type of insect that you cannot see with the naked eye. High levels of dust mites have been detected from mattresses, pillows, carpets, upholstered furniture, bed covers, clothes, soft toys and any woven material. The principal allergen of the dust mite is found in its feces. A gram of dust may contain 1,000 mites and 250,000 fecal particles. Mite antigen is easily measured in the air during house cleaning activities. Dust mites do not bite and do not cause harm to humans, other than by triggering allergies/asthma.  Ways to decrease your exposure to dust mites in your home:  1. Encase mattresses, box springs and pillows with a mite-impermeable barrier or cover  2. Wash sheets, blankets and drapes weekly in hot water (130 F) with detergent and dry them in a dryer on the hot setting.  3. Have the room cleaned frequently with a vacuum cleaner and a damp dust-mop. For carpeting or rugs, vacuuming with a vacuum cleaner equipped with a high-efficiency particulate  air (HEPA) filter. The dust mite allergic individual should not be in a room which is being cleaned and should wait 1 hour after cleaning before going into the room.  4. Do not sleep on upholstered furniture (eg, couches).  5. If possible removing carpeting, upholstered furniture and drapery from the home is ideal. Horizontal blinds should be eliminated in the rooms where the person spends the most time (bedroom, study, television room). Washable vinyl, roller-type shades are optimal.  6. Remove all non-washable stuffed toys from the bedroom. Wash stuffed toys weekly like sheets and blankets above.  7. Reduce indoor humidity to less than 50%. Inexpensive humidity monitors can be purchased at most hardware stores. Do not use a humidifier as can make the problem worse and are not recommended.  Control of Dog or Cat Allergen Avoidance is the best way to manage a dog or cat allergy. If you have a dog or cat and are allergic to dog or cats, consider removing the dog or cat from the home. If you have a dog or cat but don't want to find it a new home, or if your family wants a pet even though someone in the household is allergic, here are some strategies that may help keep symptoms at bay:  Keep the pet out of your bedroom and restrict it to only a few rooms. Be advised that keeping the dog or cat in only one room will not limit the allergens to that room. Don't pet, hug or kiss the dog or cat; if you do, wash your hands with  soap and water. High-efficiency particulate air (HEPA) cleaners run continuously in a bedroom or living room can reduce allergen levels over time. Regular use of a high-efficiency vacuum cleaner or a central vacuum can reduce allergen levels. Giving your dog or cat a bath at least once a week can reduce airborne allergen. Control of Mold Allergen Mold and fungi can grow on a variety of surfaces provided certain temperature and moisture conditions exist.  Outdoor molds grow on  plants, decaying vegetation and soil.  The major outdoor mold, Alternaria and Cladosporium, are found in very high numbers during hot and dry conditions.  Generally, a late Summer - Fall peak is seen for common outdoor fungal spores.  Rain will temporarily lower outdoor mold spore count, but counts rise rapidly when the rainy period ends.  The most important indoor molds are Aspergillus and Penicillium.  Dark, humid and poorly ventilated basements are ideal sites for mold growth.  The next most common sites of mold growth are the bathroom and the kitchen.  Outdoor [employer] Use air conditioning and keep windows closed Avoid exposure to decaying vegetation. Avoid leaf raking. Avoid grain handling. Consider wearing a face mask if working in moldy areas.  Indoor Mold Control Maintain humidity below 50%. Clean washable surfaces with 5% bleach solution. Remove sources e.g. Contaminated carpets.

## 2021-12-03 NOTE — Progress Notes (Unsigned)
RE: TIJANA WALDER MRN: [id number] DOB: 1950-10-23 Date of Telemedicine Visit: 12/04/2021  Referring provider: Leeroy Cha,* Primary care provider: Leeroy Cha, MD  Chief Complaint: No chief complaint on file.   Telemedicine Follow Up Visit via Telephone: I connected with Jatoya Armbrister for a follow up on 12/03/21 by telephone and verified that I am speaking with the correct person using two identifiers.   I discussed the limitations, risks, security and privacy concerns of performing an evaluation and management service by telephone and the availability of in person appointments. I also discussed with the patient that there may be a patient responsible charge related to this service. The patient expressed understanding and agreed to proceed.  Patient is at *** accompanied by *** who provided/contributed to the history.  Provider is at the office.  Visit start time: *** Visit end time: *** Insurance consent/check in by: *** Medical consent and medical assistant/nurse: ***  History of Present Illness: She is a 71 y.o. female, who is being followed for allergic rhinitis. Her previous allergy office visit was on 09/30/2021 with Dr. Ernst Bowler.  Her last environmental allergy testing was on 09/30/2021 and was positive to mold mix, dust mite, cat, and dog.  ***  Assessment and Plan: [place] is a 71 y.o. female with: There are no Patient Instructions on file for this visit.  No follow-ups on file.  No orders of the defined types were placed in this encounter.  Lab Orders  No laboratory test(s) ordered today    Diagnostics: None.  Medication List:  Current Outpatient Medications  Medication Sig Dispense Refill   ALPRAZolam (XANAX) 0.25 MG tablet Take 0.25 mg by mouth 2 (two) times daily as needed for anxiety.     apixaban (ELIQUIS) 5 MG TABS tablet TAKE 1 TABLET BY MOUTH  TWICE DAILY 180 tablet 1   buPROPion (WELLBUTRIN XL) 150 MG 24 hr tablet Take 150 mg by  mouth every morning.     cholecalciferol (VITAMIN D) 1000 units tablet Take 1,000 Units by mouth daily.     diltiazem (CARDIZEM) 30 MG tablet Take 1 tablet (30 mg total) by mouth 4 (four) times daily as needed. 30 tablet 1   FLUoxetine (PROZAC) 20 MG capsule Take 40 mg by mouth every morning.     fluticasone (FLONASE) 50 MCG/ACT nasal spray 2 sprays by Each Nare route daily. (Patient not taking: Reported on 09/30/2021)     loratadine (CLARITIN) 10 MG tablet Take 10 mg by mouth daily. (Patient not taking: Reported on 09/30/2021)     simvastatin (ZOCOR) 40 MG tablet Take 40 mg by mouth every morning.     No current facility-administered medications for this visit.   Allergies: Allergies  Allergen Reactions   Codeine Nausea And Vomiting   Azithromycin Rash   I reviewed her past medical history, social history, family history, and environmental history and no significant changes have been reported from previous visit on 10/10/2021.  Review of Systems Objective: Physical Exam Not obtained as encounter was done via telephone.   Previous notes and tests were reviewed.  I discussed the assessment and treatment plan with the patient. The patient was provided an opportunity to ask questions and all were answered. The patient agreed with the plan and demonstrated an understanding of the instructions.   The patient was advised to call back or seek an in-person evaluation if the symptoms worsen or if the condition fails to improve as anticipated.  I provided *** minutes of non-face-to-face time during  this encounter.  It was my pleasure to participate in [facility] care today. Please feel free to contact me with any questions or concerns.   Sincerely,  Gareth Morgan, FNP

## 2021-12-04 ENCOUNTER — Telehealth: Payer: Self-pay

## 2021-12-04 ENCOUNTER — Other Ambulatory Visit: Payer: Self-pay | Admitting: Family Medicine

## 2021-12-04 ENCOUNTER — Encounter: Payer: Self-pay | Admitting: Cardiology

## 2021-12-04 ENCOUNTER — Encounter: Payer: Self-pay | Admitting: Family Medicine

## 2021-12-04 ENCOUNTER — Ambulatory Visit (INDEPENDENT_AMBULATORY_CARE_PROVIDER_SITE_OTHER): Payer: Medicare Other | Admitting: Family Medicine

## 2021-12-04 DIAGNOSIS — J3089 Other allergic rhinitis: Secondary | ICD-10-CM | POA: Diagnosis not present

## 2021-12-04 DIAGNOSIS — J302 Other seasonal allergic rhinitis: Secondary | ICD-10-CM

## 2021-12-04 DIAGNOSIS — I480 Paroxysmal atrial fibrillation: Secondary | ICD-10-CM

## 2021-12-04 MED ORDER — CARBINOXAMINE MALEATE 4 MG PO TABS: ORAL_TABLET | ORAL | 2 refills | Status: DC

## 2021-12-04 NOTE — Telephone Encounter (Signed)
Please advise. Not cover

## 2021-12-04 NOTE — Telephone Encounter (Signed)
Called patient and gave the two allergy vial set code(CPT [phone]) and the Two injection code (CPT [phone]) to call her insurance. Advised if any questions to call back.

## 2021-12-05 NOTE — Telephone Encounter (Signed)
Can you please find out what they will cover? A name brand, RyVent? Thank you

## 2021-12-09 ENCOUNTER — Telehealth: Payer: Self-pay

## 2021-12-09 NOTE — Telephone Encounter (Signed)
Called to schedule the patient for Watchman consult with Dr. Quentin Ore per Dr. Marlou Porch. The patient reports she thought about it and does not want to speak with Dr. Quentin Ore.  She understands to call back if she changes her mind prior to return office visit with Dr. Marlou Porch in the spring. She was grateful for call and agrees with plan.

## 2021-12-10 ENCOUNTER — Other Ambulatory Visit: Payer: Self-pay | Admitting: Family Medicine

## 2021-12-15 ENCOUNTER — Other Ambulatory Visit: Payer: Self-pay | Admitting: Family Medicine

## 2021-12-25 ENCOUNTER — Telehealth: Payer: Self-pay

## 2021-12-25 MED ORDER — CARBINOXAMINE MALEATE 4 MG PO TABS: 1.0000 | ORAL_TABLET | Freq: Three times a day (TID) | ORAL | 3 refills | Status: DC | PRN

## 2021-12-25 NOTE — Telephone Encounter (Signed)
Rx sent as requested.

## 2021-12-25 NOTE — Addendum Note (Signed)
Addended by: Wadie Lessen on: 12/25/2021 04:40 PM   Modules accepted: Orders

## 2021-12-25 NOTE — Telephone Encounter (Signed)
We can send in carbinoxamine '4mg'$  daily as needed every 8 hours.   Salvatore Marvel, MD [facility]

## 2021-12-25 NOTE — Telephone Encounter (Signed)
Patient called stating the Ryvent isn't covered under her insurance and wanted to know if Carbinoxamine can be prescribed instead. Patient had [employer] insurance. How would you like for her to take the Carbinoxamine 1-2 tablets daily?  Pam [phone]

## 2021-12-29 ENCOUNTER — Ambulatory Visit
Admission: RE | Admit: 2021-12-29 | Discharge: 2021-12-29 | Disposition: A | Discharge status: Home / Self Care | Payer: Medicare Other | Source: Ambulatory Visit | Attending: Internal Medicine | Admitting: Internal Medicine

## 2021-12-29 DIAGNOSIS — M8589 Other specified disorders of bone density and structure, multiple sites: Secondary | ICD-10-CM | POA: Diagnosis not present

## 2021-12-29 DIAGNOSIS — M8588 Other specified disorders of bone density and structure, other site: Secondary | ICD-10-CM

## 2021-12-29 DIAGNOSIS — Z780 Asymptomatic menopausal state: Secondary | ICD-10-CM | POA: Diagnosis not present

## 2021-12-31 ENCOUNTER — Other Ambulatory Visit: Payer: Self-pay

## 2021-12-31 MED ORDER — CARBINOXAMINE MALEATE 4 MG PO TABS: 1.0000 | ORAL_TABLET | Freq: Three times a day (TID) | ORAL | 3 refills | Status: DC | PRN

## 2022-01-08 DIAGNOSIS — J301 Allergic rhinitis due to pollen: Secondary | ICD-10-CM | POA: Diagnosis not present

## 2022-01-08 DIAGNOSIS — E785 Hyperlipidemia, unspecified: Secondary | ICD-10-CM | POA: Diagnosis not present

## 2022-01-08 DIAGNOSIS — I480 Paroxysmal atrial fibrillation: Secondary | ICD-10-CM | POA: Diagnosis not present

## 2022-01-08 DIAGNOSIS — J320 Chronic maxillary sinusitis: Secondary | ICD-10-CM | POA: Diagnosis not present

## 2022-02-26 ENCOUNTER — Other Ambulatory Visit: Payer: Self-pay | Admitting: Cardiology

## 2022-02-26 DIAGNOSIS — I480 Paroxysmal atrial fibrillation: Secondary | ICD-10-CM

## 2022-02-26 NOTE — Telephone Encounter (Signed)
Prescription refill request for Eliquis received. Indication:Afib Last office visit:5/23 Scr:0.7 Age: 71 Weight:65.6 kg  Prescription refilled

## 2022-03-16 ENCOUNTER — Ambulatory Visit: Payer: Medicare Other | Admitting: Family Medicine

## 2022-03-16 ENCOUNTER — Telehealth: Payer: Self-pay

## 2022-03-16 ENCOUNTER — Encounter: Payer: Self-pay | Admitting: Family Medicine

## 2022-03-16 VITALS — BP 130/76 | HR 120 | Temp 97.9°F | Resp 16 | Wt 146.0 lb

## 2022-03-16 DIAGNOSIS — K219 Gastro-esophageal reflux disease without esophagitis: Secondary | ICD-10-CM | POA: Diagnosis not present

## 2022-03-16 DIAGNOSIS — R052 Subacute cough: Secondary | ICD-10-CM

## 2022-03-16 DIAGNOSIS — J302 Other seasonal allergic rhinitis: Secondary | ICD-10-CM

## 2022-03-16 DIAGNOSIS — J3089 Other allergic rhinitis: Secondary | ICD-10-CM | POA: Diagnosis not present

## 2022-03-16 MED ORDER — OMEPRAZOLE 40 MG PO CPDR: 40.0000 mg | DELAYED_RELEASE_CAPSULE | Freq: Every day | ORAL | 5 refills | Status: DC

## 2022-03-16 MED ORDER — XHANCE 93 MCG/ACT NA EXHU: 2.0000 | INHALANT_SUSPENSION | Freq: Every day | NASAL | 5 refills | Status: DC

## 2022-03-16 NOTE — Telephone Encounter (Signed)
Called patient's insurance. Two reference numbers were given. 1) [id number] 2) [id number]. Personnel stated that the CPT Code 71046 did not need prior auth. Called patient and informed her of this information. Patient stated that she understood and would go get the Xray ASAP in the morning.

## 2022-03-16 NOTE — Progress Notes (Signed)
[facility] [id number] Dept: [phone]  FOLLOW UP NOTE  Patient ID: Leah Reeves, female    DOB: March 30, 1951  Age: 71 y.o. MRN: [id number] Date of Office Visit: 03/16/2022  Assessment  Chief Complaint: Follow-up (Nothing has cleared up the congestion and cough. Nasal drainage more now since she had the surgery. )  HPI Leah Reeves is a 71 year old female who presents the clinic for a follow-up visit.  She was last seen in this clinic via telephone visit on 12/04/2021 by Gareth Morgan, FNP, for evaluation of allergic rhinitis. At today's visit, she reports that her allergic rhinitis has been poorly controlled over the last 4 years with symptoms including occasional nasal congestion and thick post nasal drainage.  Allergic rhinitis is reported as moderately well controlled with symptoms including occasional sneeze and some postnasal drainage.  She denies rhinorrhea and nasal congestion.  She denies fever, sweats, chills, or sick contacts.  She continues carbinoxamine 4 mg twice a day and does report some sleepy side effects.  She is not currently using Flonase or nasal saline rinses as she reports these interventions were not successful in the past.  Her last environmental allergy testing was on 09/30/2021 and was positive to mold mix, dust mite, cat, and dog.  She does have a dog at home that sleeps in her bed.  She is considering allergen immunotherapy if no improvement  of her symptoms of allergic rhinitis with her current therapy.  She continues to cough which has been consistent over the last several years and is producing thick yellowish and green mucus.  She denies shortness of breath or wheeze with activity or rest.  She reports that she did go to a lung specialist several years ago and tried inhalers which resulted in atrial fibrillation for which she currently takes apixaban.  She has recently had a sinus surgery with Dr. Redmond Baseman in April, 2023 with moderate relief of symptoms.   She denies symptoms of reflux including heartburn and vomiting.  She is not currently taking a medication to control reflux.  Her current medications are listed in the chart.   Drug Allergies:  Allergies  Allergen Reactions   Codeine Nausea And Vomiting   Azithromycin Rash    Physical Exam: BP 130/76   Pulse (!) 120   Temp 97.9 F (36.6 C) (Temporal)   Resp 16   Wt 146 lb (66.2 kg)   LMP 05/25/2008   SpO2 96%   BMI 27.14 kg/m    Physical Exam Vitals reviewed.  Constitutional:      Appearance: Normal appearance.  HENT:     Head: Normocephalic and atraumatic.     Right Ear: Tympanic membrane normal.     Left Ear: Tympanic membrane normal.     Nose:     Comments: Bilateral nares slightly erythematous with clear nasal drainage noted.  Pharynx erythematous with no exudate.  Ears normal.  Eyes normal. Eyes:     Conjunctiva/sclera: Conjunctivae normal.  Cardiovascular:     Rate and Rhythm: Normal rate and regular rhythm.     Heart sounds: Normal heart sounds. No murmur heard. Pulmonary:     Effort: Pulmonary effort is normal.     Comments: Scattered rhonchi withch partially cleared with cough Musculoskeletal:        General: Normal range of motion.     Cervical back: Normal range of motion and neck supple.  Skin:    General: Skin is warm and dry.  Neurological:  Mental Status: She is alert and oriented to person, place, and time.  Psychiatric:        Mood and Affect: Mood normal.        Behavior: Behavior normal.        Thought Content: Thought content normal.        Judgment: Judgment normal.     Diagnostics: FVC 2.69, FEV1 2.24.  Predicted FVC 2.57, predicted FEV1 1.93.  Spirometry indicates normal ventilatory function.  Assessment and Plan: 1. Seasonal and perennial allergic rhinitis   2. Subacute cough   3. Gastroesophageal reflux disease, unspecified whether esophagitis present     Meds ordered this encounter  Medications   omeprazole (PRILOSEC) 40  MG capsule    Sig: Take 1 capsule (40 mg total) by mouth daily.    Dispense:  30 capsule    Refill:  5    Patient Instructions  Cough Get a chest xray We will call you when the result becomes available  Allergic rhinitis Continue allergen avoidance measures directed toward mold, dust mite, and pets as listed below Stop carbinoxamine for now. If you have a runny nose or itch take carbinoxamine 4 mg tablets. Take 1-2 tablets once or twice a day as needed for runny nose or itch  Begin saline nasal rinses as needed for nasal symptoms. Use this before any medicated nasal sprays for best result Begin Xhance 2 sprays in each nostril once a day for nasal congestion. For thick post nasal drainage, begin Mucinex [id number] mg twice a day Allergen immunotherapy codes provided. Call the clinic to set up your frst allergen immunotherapy appointment if interested.   Reflux Begin dietary and lifestyle modifications as listed below Begin omeprazole 40 mg once a day to prevent reflux. Take this medication 30 minutes before your first meal  Call the clinic if this treatment plan is not working well for you  Follow up in 1 month or sooner if needed.   Return in about 4 weeks (around 04/13/2022), or if symptoms worsen or fail to improve.    Thank you for the opportunity to care for this patient.  Please do not hesitate to contact me with questions.  Gareth Morgan, FNP [facility]

## 2022-03-16 NOTE — Patient Instructions (Addendum)
Cough Get a chest xray We will call you when the result becomes available  Allergic rhinitis Continue allergen avoidance measures directed toward mold, dust mite, and pets as listed below Stop carbinoxamine for now. If you have a runny nose or itch take carbinoxamine 4 mg tablets. Take 1-2 tablets once or twice a day as needed for runny nose or itch  Begin saline nasal rinses as needed for nasal symptoms. Use this before any medicated nasal sprays for best result Begin Xhance 2 sprays in each nostril once a day for nasal congestion. For thick post nasal drainage, begin Mucinex [phone] mg twice a day Allergen immunotherapy codes provided. Call the clinic to set up your frst allergen immunotherapy appointment if interested.   Reflux Begin dietary and lifestyle modifications as listed below Begin omeprazole 40 mg once a day to prevent reflux. Take this medication 30 minutes before your first meal  Call the clinic if this treatment plan is not working well for you  Follow up in 1 month or sooner if needed.   Control of Dust Mite Allergen Dust mites play a major role in allergic asthma and rhinitis. They occur in environments with high humidity wherever human skin is found. Dust mites absorb humidity from the atmosphere (ie, they do not drink) and feed on organic matter (including shed human and animal skin). Dust mites are a microscopic type of insect that you cannot see with the naked eye. High levels of dust mites have been detected from mattresses, pillows, carpets, upholstered furniture, bed covers, clothes, soft toys and any woven material. The principal allergen of the dust mite is found in its feces. A gram of dust may contain 1,000 mites and 250,000 fecal particles. Mite antigen is easily measured in the air during house cleaning activities. Dust mites do not bite and do not cause harm to humans, other than by triggering allergies/asthma.  Ways to decrease your exposure to dust mites in  your home:  1. Encase mattresses, box springs and pillows with a mite-impermeable barrier or cover  2. Wash sheets, blankets and drapes weekly in hot water (130 F) with detergent and dry them in a dryer on the hot setting.  3. Have the room cleaned frequently with a vacuum cleaner and a damp dust-mop. For carpeting or rugs, vacuuming with a vacuum cleaner equipped with a high-efficiency particulate air (HEPA) filter. The dust mite allergic individual should not be in a room which is being cleaned and should wait 1 hour after cleaning before going into the room.  4. Do not sleep on upholstered furniture (eg, couches).  5. If possible removing carpeting, upholstered furniture and drapery from the home is ideal. Horizontal blinds should be eliminated in the rooms where the person spends the most time (bedroom, study, television room). Washable vinyl, roller-type shades are optimal.  6. Remove all non-washable stuffed toys from the bedroom. Wash stuffed toys weekly like sheets and blankets above.  7. Reduce indoor humidity to less than 50%. Inexpensive humidity monitors can be purchased at most hardware stores. Do not use a humidifier as can make the problem worse and are not recommended.  Control of Dog or Cat Allergen Avoidance is the best way to manage a dog or cat allergy. If you have a dog or cat and are allergic to dog or cats, consider removing the dog or cat from the home. If you have a dog or cat but don't want to find it a new home, or if your family  wants a pet even though someone in the household is allergic, here are some strategies that may help keep symptoms at bay:  Keep the pet out of your bedroom and restrict it to only a few rooms. Be advised that keeping the dog or cat in only one room will not limit the allergens to that room. Don't pet, hug or kiss the dog or cat; if you do, wash your hands with soap and water. High-efficiency particulate air (HEPA) cleaners run continuously  in a bedroom or living room can reduce allergen levels over time. Regular use of a high-efficiency vacuum cleaner or a central vacuum can reduce allergen levels. Giving your dog or cat a bath at least once a week can reduce airborne allergen. Control of Mold Allergen Mold and fungi can grow on a variety of surfaces provided certain temperature and moisture conditions exist.  Outdoor molds grow on plants, decaying vegetation and soil.  The major outdoor mold, Alternaria and Cladosporium, are found in very high numbers during hot and dry conditions.  Generally, a late Summer - Fall peak is seen for common outdoor fungal spores.  Rain will temporarily lower outdoor mold spore count, but counts rise rapidly when the rainy period ends.  The most important indoor molds are Aspergillus and Penicillium.  Dark, humid and poorly ventilated basements are ideal sites for mold growth.  The next most common sites of mold growth are the bathroom and the kitchen.  Outdoor [employer] Use air conditioning and keep windows closed Avoid exposure to decaying vegetation. Avoid leaf raking. Avoid grain handling. Consider wearing a face mask if working in moldy areas.  Indoor Mold Control Maintain humidity below 50%. Clean washable surfaces with 5% bleach solution. Remove sources e.g. Contaminated carpets.

## 2022-03-16 NOTE — Addendum Note (Signed)
Addended by: Eloy End D on: 03/16/2022 06:25 PM   Modules accepted: Orders

## 2022-03-17 ENCOUNTER — Ambulatory Visit
Admission: RE | Admit: 2022-03-17 | Discharge: 2022-03-17 | Disposition: A | Discharge status: Home / Self Care | Payer: Medicare Other | Source: Ambulatory Visit | Attending: Family Medicine | Admitting: Family Medicine

## 2022-03-17 DIAGNOSIS — R059 Cough, unspecified: Secondary | ICD-10-CM | POA: Diagnosis not present

## 2022-03-17 NOTE — Progress Notes (Signed)
Can you please call this patient and let her know that the chest xray was clear. Let's stick with the treatment plan that we formulated yesterday. Thank you

## 2022-03-17 NOTE — Progress Notes (Signed)
Oh no. Flonase, nasacort or nasonex are similar medications. Please make sure her technique is excellent. Thank you.   In the right nostril, point the applicator out toward the right ear. In the left nostril, point the applicator out toward the left ear

## 2022-04-02 ENCOUNTER — Ambulatory Visit: Payer: Medicare Other | Admitting: Allergy & Immunology

## 2022-04-22 ENCOUNTER — Telehealth: Payer: Self-pay | Admitting: Cardiology

## 2022-04-22 DIAGNOSIS — I480 Paroxysmal atrial fibrillation: Secondary | ICD-10-CM

## 2022-04-22 MED ORDER — APIXABAN 5 MG PO TABS: 5.0000 mg | ORAL_TABLET | Freq: Two times a day (BID) | ORAL | 3 refills | Status: DC

## 2022-04-22 NOTE — Telephone Encounter (Signed)
Prescription refill request for Eliquis received. Indication: PAF Last office visit: 09/23/21  Derl Barrow MD Scr: 0.78 on 07/10/21 Age: 71 Weight: 66kg  Based on above findings Eliquis '5mg'$  twice daily is the appropriate dose.  Refill approved.

## 2022-04-22 NOTE — Telephone Encounter (Signed)
*  STAT* If patient is at the pharmacy, call can be transferred to refill team.   1. Which medications need to be refilled? (please list name of each medication and dose if known)   apixaban (ELIQUIS) 5 MG TABS table    2. Which pharmacy/location (including street and city if local pharmacy) is medication to be sent to?  [id number] - [place], [place]      3. Do they need a 30 day or 90 day supply?  30 day

## 2022-04-25 ENCOUNTER — Telehealth: Payer: Self-pay | Admitting: Cardiology

## 2022-04-25 NOTE — Telephone Encounter (Signed)
Patient called in reporting she feels like she is in Afib this morning. Reports HRs have been in the 100-115 range. Overall not symptomatic, just slightly fatigued. She had taken a single dose of diltiazem '30mg'$  this morning around 9am. Advised she could take additional Diltiazem, monitoring BP. She is on Eliquis, no missed doses. Previously seen in the afib clinic in 2019. Advised ok to monitor symptoms, use PRN diltiazem. Will message Afib clinic for follow up on Monday to arrange for office visit. Patient was agreeable to plan and thanked me for call back. ER precautions given.

## 2022-04-29 ENCOUNTER — Ambulatory Visit (HOSPITAL_COMMUNITY)
Admission: RE | Admit: 2022-04-29 | Discharge: 2022-04-29 | Disposition: A | Discharge status: Home / Self Care | Payer: Medicare Other | Source: Ambulatory Visit | Attending: Physician Assistant | Admitting: Physician Assistant

## 2022-04-29 ENCOUNTER — Encounter (HOSPITAL_COMMUNITY): Payer: Self-pay | Admitting: Physician Assistant

## 2022-04-29 VITALS — BP 146/70 | HR 64 | Ht 61.5 in | Wt 139.4 lb

## 2022-04-29 DIAGNOSIS — Z7901 Long term (current) use of anticoagulants: Secondary | ICD-10-CM | POA: Insufficient documentation

## 2022-04-29 DIAGNOSIS — I480 Paroxysmal atrial fibrillation: Secondary | ICD-10-CM | POA: Diagnosis not present

## 2022-04-29 DIAGNOSIS — E785 Hyperlipidemia, unspecified: Secondary | ICD-10-CM | POA: Diagnosis not present

## 2022-04-29 MED ORDER — DILTIAZEM HCL 30 MG PO TABS: 30.0000 mg | ORAL_TABLET | Freq: Four times a day (QID) | ORAL | 1 refills | Status: AC | PRN

## 2022-04-29 NOTE — Progress Notes (Signed)
Primary Care Physician: Leeroy Cha, MD Primary Cardiologist: Dr Marlou Porch Primary Electrophysiologist: Dr Curt Bears Referring Physician: Dr Little Ishikawa is a 71 y.o. female with a history of HLD and atrial fibrillation who presents for consultation in the [facility]. She presented to the emergency room on 10/26/2017 with atrial fibrillation and rapid rates. She had an episode the day prior that following a panic attack when she got bad news. Patient is on Eliquis for a CHADS2VASC score of 2.   On follow up today, she had done very well with no afib until 04/25/22 when she woke with fatigue and palpitations. She checked her smart watch which showed afib with heart rates 100-120 bpm. She took PRN diltiazem and the episode resolved in about 4 hours. She does reports that she had been under stress the evening before when she had to take her husband to the hospital for a syncopal episode.   Today, she denies symptoms of palpitations, chest pain, shortness of breath, orthopnea, PND, lower extremity edema, dizziness, presyncope, syncope, snoring, daytime somnolence, bleeding, or neurologic sequela. The patient is tolerating medications without difficulties and is otherwise without complaint today.    Atrial Fibrillation Risk Factors:  she does not have symptoms or diagnosis of sleep apnea. she does not have a history of rheumatic fever. she does not have a history of alcohol use.   she has a BMI of Body mass index is 25.91 kg/m.Marland Kitchen Filed Weights   04/29/22 1027  Weight: 63.2 kg    Family History  Problem Relation Age of Onset   Diabetes Mother    Hypertension Mother    Heart disease Father    Diabetes Sister    Heart disease Sister      Atrial Fibrillation Management history:  Previous antiarrhythmic drugs: none Previous cardioversions: none Previous ablations: none CHADS2VASC score: 2 Anticoagulation history: Eliquis   Past  Medical History:  Diagnosis Date   Anxiety    Arthritis    knees -generalized   Asthma    Bronchitis    Depression    Headache    past history - none at present   Heart murmur    Osteopenia    Perimenopausal vasomotor symptoms    Vertigo    tx. 6 months ago- no problems now,very mild occ.   Past Surgical History:  Procedure Laterality Date   BREAST CYST ASPIRATION Right 2006   COLONOSCOPY WITH PROPOFOL N/A 09/18/2014   Procedure: COLONOSCOPY WITH PROPOFOL;  Surgeon: Garlan Fair, MD;  Location: [facility];  Service: Endoscopy;  Laterality: N/A;   NASAL SINUS SURGERY      Current Outpatient Medications  Medication Sig Dispense Refill   ALPRAZolam (XANAX) 0.25 MG tablet Take 0.25 mg by mouth 2 (two) times daily as needed for anxiety.     apixaban (ELIQUIS) 5 MG TABS tablet Take 1 tablet (5 mg total) by mouth 2 (two) times daily. 200 tablet 3   buPROPion (WELLBUTRIN XL) 150 MG 24 hr tablet Take 150 mg by mouth every morning.     cholecalciferol (VITAMIN D) 1000 units tablet Take 1,000 Units by mouth daily.     diltiazem (CARDIZEM) 30 MG tablet Take 1 tablet (30 mg total) by mouth 4 (four) times daily as needed. 30 tablet 1   FLUoxetine (PROZAC) 20 MG capsule Take 40 mg by mouth every morning.     simvastatin (ZOCOR) 40 MG tablet Take 40 mg by mouth every morning.  No current facility-administered medications for this encounter.    Allergies  Allergen Reactions   Codeine Nausea And Vomiting   Azithromycin Rash    Social History   Socioeconomic History   Marital status: Married    Spouse name: Not on file   Number of children: Not on file   Years of education: Not on file   Highest education level: Not on file  Occupational History   Not on file  Tobacco Use   Smoking status: Former    Packs/day: 0.50    Years: 3.00    Total pack years: 1.50    Types: Cigarettes    Quit date: 05/26/1991    Years since quitting: 30.9    Passive exposure: Past   Smokeless  tobacco: Never   Tobacco comments:    Former smoker 04/29/22  Vaping Use   Vaping Use: Never used  Substance and Sexual Activity   Alcohol use: Not Currently   Drug use: No   Sexual activity: Yes    Partners: Male    Birth control/protection: Post-menopausal    Comment: 1st intercourse- 23, partners- 13, married- 25 yrs   Other Topics Concern   Not on file  Social History Narrative   Not on file   Social Determinants of Health   Financial Resource Strain: Not on file  Food Insecurity: Not on file  Transportation Needs: Not on file  Physical Activity: Not on file  Stress: Not on file  Social Connections: Not on file  Intimate Partner Violence: Not on file     ROS- All systems are reviewed and negative except as per the HPI above.  Physical Exam: Vitals:   04/29/22 1027  BP: (!) 146/70  Pulse: 64  Weight: 63.2 kg  Height: 5' 1.5" (1.562 m)    GEN- The patient is a well appearing female, alert and oriented x 3 today.   Head- normocephalic, atraumatic Eyes-  Sclera clear, conjunctiva pink Ears- hearing intact Oropharynx- clear Neck- supple  Lungs- Clear to ausculation bilaterally, normal work of breathing Heart- Regular rate and rhythm, no murmurs, rubs or gallops  GI- soft, NT, ND, + BS Extremities- no clubbing, cyanosis, or edema MS- no significant deformity or atrophy Skin- no rash or lesion Psych- euthymic mood, full affect Neuro- strength and sensation are intact  Wt Readings from Last 3 Encounters:  04/29/22 63.2 kg  03/16/22 66.2 kg  09/30/21 65.6 kg    EKG today demonstrates  SR Vent. rate 64 BPM PR interval 144 ms QRS duration 88 ms QT/QTcB 412/425 ms  Echo 11/05/17 demonstrated  - Left ventricle: The cavity size was normal. Wall thickness was    normal. Systolic function was normal. The estimated ejection    fraction was in the range of 60% to 65%. Wall motion was normal;    there were no regional wall motion abnormalities. Features are     consistent with a pseudonormal left ventricular filling pattern,    with concomitant abnormal relaxation and increased filling    pressure (grade 2 diastolic dysfunction). GLS -22.6% (normal).  - Aortic valve: There was no stenosis.  - Mitral valve: There was trivial regurgitation.  - Left atrium: The atrium was mildly dilated.  - Right ventricle: The cavity size was normal. Systolic function    was normal.  - Tricuspid valve: Peak RV-RA gradient (S): 29 mm Hg.  - Pulmonary arteries: PA peak pressure: 32 mm Hg (S).  - Inferior vena cava: The vessel was normal in  size. The    respirophasic diameter changes were in the normal range (>= 50%),    consistent with normal central venous pressure.  - Pericardium, extracardiac: A trivial pericardial effusion was    identified.   Impressions:   - Normal LV size with EF 60-65%. Moderate diastolic dysfunction.    Normal RV size and systolic function. No significant valvular    abnormalities.   Epic records are reviewed at length today  CHA2DS2-VASc Score = 2  The patient's score is based upon: CHF History: 0 HTN History: 0 Diabetes History: 0 Stroke History: 0 Vascular Disease History: 0 Age Score: 1 Gender Score: 1       ASSESSMENT AND PLAN: 1. Paroxysmal Atrial Fibrillation (ICD10:  I48.0) The patient's CHA2DS2-VASc score is 2, indicating a 2.2% annual risk of stroke.   Patient back in SR. This is her first episode of afib since 2019. We discussed rhythm control options but given the infrequency of her episodes I don't think AAD or ablation is warranted at this time. Patient in agreement with plan.  Continue Eliquis 5 mg BID Continue diltiazem 30 mg PRN q 4 hours Apple Watch for home monitoring.    Follow up in the AF clinic in 6 months.    [facility] [address] [place], [place] [address] [phone] 04/29/2022 10:36 AM

## 2022-05-08 ENCOUNTER — Telehealth: Payer: Self-pay | Admitting: Cardiology

## 2022-05-08 DIAGNOSIS — I480 Paroxysmal atrial fibrillation: Secondary | ICD-10-CM | POA: Diagnosis not present

## 2022-05-08 DIAGNOSIS — J329 Chronic sinusitis, unspecified: Secondary | ICD-10-CM | POA: Diagnosis not present

## 2022-05-08 DIAGNOSIS — D6869 Other thrombophilia: Secondary | ICD-10-CM | POA: Diagnosis not present

## 2022-05-08 DIAGNOSIS — I10 Essential (primary) hypertension: Secondary | ICD-10-CM | POA: Diagnosis not present

## 2022-05-08 NOTE — Telephone Encounter (Signed)
Spoke with pt who reports her PCP started her on Diltiazem 90 mg BID.  BP was elevated.  She will check BP through the weekend and as long as everything is OK she will f/u there in one month.  She has been scheduled for her 1 yr f/u with Dr Marlou Porch but is aware to call back before if any questions or concerns.

## 2022-05-08 NOTE — Telephone Encounter (Signed)
Pt c/o of Chest Pain: STAT if CP now or developed within 24 hours  1. Are you having CP right now? no  2. Are you experiencing any other symptoms (ex. SOB, nausea, vomiting, sweating)? no  3. How long have you been experiencing CP? About a week, more in the last few days  4. Is your CP continuous or coming and going? Comes and goes  5. Have you taken Nitroglycerin? no   Patient states she went into afib 12/2 and was seen by the afib clinic. She says she is no longer in afib, but she has now started having chest tightness. She says she has also gotten a sharp pain in her chest that happens for a split second several times a day. She says she is also belching a lot. She states this may be due to anxiety, because she says the day before she went into afib she had an incident that made her very nervous. She says she has been more anxious lately. She says she does now have an apple watch that will notify her if she goes into afib and she has not.   ?

## 2022-05-08 NOTE — Telephone Encounter (Signed)
Spoke with pt who is currently at her PCP office for evaluation of anxiety.  She is reporting she has had 2 episodes of At Fib.  The most recent was 12/2 and she was seen in the At Fib clinic.  She reports her hr is in control and is taking Eliquis as RXed but she "just don't feel back to par."  She is not currently having CP.  During the call, pt was called back for her appt at pcp office.  Pt states she will see what her PCP says and call back if she feels she needs further evaluation.

## 2022-05-08 NOTE — Telephone Encounter (Signed)
Pt is returning call to update after her PCP appt today and medication change. Requesting call back.

## 2022-05-12 ENCOUNTER — Telehealth: Payer: Self-pay | Admitting: Cardiology

## 2022-05-12 ENCOUNTER — Telehealth (HOSPITAL_COMMUNITY): Payer: Self-pay | Admitting: *Deleted

## 2022-05-12 DIAGNOSIS — I480 Paroxysmal atrial fibrillation: Secondary | ICD-10-CM

## 2022-05-12 MED ORDER — METOPROLOL SUCCINATE ER 25 MG PO TB24: 25.0000 mg | ORAL_TABLET | Freq: Every day | ORAL | 3 refills | Status: DC

## 2022-05-12 NOTE — Telephone Encounter (Signed)
Pt calling to notify Dr Marlou Porch she had another episode of At Fib.  Medication was changed to Metoprolol succinate 25 mg daily and to use Diltiazem 30 mg QID prn break-through per the At Fib clinic.  She will call back if any questions or concerns.

## 2022-05-12 NOTE — Telephone Encounter (Signed)
Patient calling to let her know that her medication has been change. Please advise

## 2022-05-12 NOTE — Telephone Encounter (Signed)
Pt called in stating she went into afib last night around 9pm. HRs in the 110s. BP stable. Has taken several doses of cardizem '30mg'$  tablets last at 330am this morning. Currently HR 96 BP 120/87. Pt denies symptoms other than feeling tired. Discussed with Adline Peals PA will start metoprolol succinate '25mg'$  once a day and continue cardizem '30mg'$  prn. Pt in agreement. She will call with update this afternoon.

## 2022-05-15 NOTE — Telephone Encounter (Signed)
Spoke with pt who is agreeable to consult for EP to discuss possible At Fib ablation.  She is aware the order will be placed and she will be contacted to be scheduled.

## 2022-06-03 DIAGNOSIS — I480 Paroxysmal atrial fibrillation: Secondary | ICD-10-CM | POA: Diagnosis not present

## 2022-06-08 ENCOUNTER — Ambulatory Visit (HOSPITAL_COMMUNITY)
Admission: RE | Admit: 2022-06-08 | Discharge: 2022-06-08 | Disposition: A | Discharge status: Home / Self Care | Payer: Medicare Other | Source: Ambulatory Visit | Attending: Physician Assistant | Admitting: Physician Assistant

## 2022-06-08 VITALS — BP 152/66 | HR 63 | Ht 61.5 in | Wt 142.4 lb

## 2022-06-08 DIAGNOSIS — R079 Chest pain, unspecified: Secondary | ICD-10-CM

## 2022-06-08 DIAGNOSIS — Z7901 Long term (current) use of anticoagulants: Secondary | ICD-10-CM | POA: Insufficient documentation

## 2022-06-08 DIAGNOSIS — E785 Hyperlipidemia, unspecified: Secondary | ICD-10-CM | POA: Insufficient documentation

## 2022-06-08 DIAGNOSIS — I480 Paroxysmal atrial fibrillation: Secondary | ICD-10-CM | POA: Insufficient documentation

## 2022-06-08 DIAGNOSIS — R0789 Other chest pain: Secondary | ICD-10-CM | POA: Diagnosis not present

## 2022-06-08 NOTE — Addendum Note (Signed)
Encounter addended by: Juluis Mire, RN on: 06/08/2022 3:45 PM  Actions taken: Visit diagnoses modified, Order list changed, Diagnosis association updated

## 2022-06-08 NOTE — Progress Notes (Signed)
Primary Care Physician: Leeroy Cha, MD Primary Cardiologist: Dr Marlou Porch Primary Electrophysiologist: Dr Curt Bears Referring Physician: Dr Little Ishikawa is a 72 y.o. female with a history of HLD and atrial fibrillation who presents for consultation in the [facility]. She presented to the emergency room on 10/26/2017 with atrial fibrillation and rapid rates. She had an episode the day prior that following a panic attack when she got bad news. Patient is on Eliquis for a CHADS2VASC score of 2.   She had done very well with no afib until 04/25/22 when she woke with fatigue and palpitations. She checked her smart watch which showed afib with heart rates 100-120 bpm. She took PRN diltiazem and the episode resolved in about 4 hours.   On follow up today, patient reports that she has had three additional afib episodes since her last visit. She started Toprol on 05/26/22. Her last afib episode was on 05/30/22. Over the last several weeks, she has noticed a "tightness" across her chest "like a rubber band". It is fairly constant but more noticeable with activity. No other associated symptoms. Can be a 3-4/10 in severity at times but is usually mild.    Today, she denies symptoms of shortness of breath, orthopnea, PND, lower extremity edema, dizziness, presyncope, syncope, snoring, daytime somnolence, bleeding, or neurologic sequela. The patient is tolerating medications without difficulties and is otherwise without complaint today.    Atrial Fibrillation Risk Factors:  she does not have symptoms or diagnosis of sleep apnea. she does not have a history of rheumatic fever. she does not have a history of alcohol use.   she has a BMI of Body mass index is 26.47 kg/m.Marland Kitchen Filed Weights   06/08/22 0953  Weight: 64.6 kg    Family History  Problem Relation Age of Onset   Diabetes Mother    Hypertension Mother    Heart disease Father    Diabetes Sister     Heart disease Sister      Atrial Fibrillation Management history:  Previous antiarrhythmic drugs: none Previous cardioversions: none Previous ablations: none CHADS2VASC score: 2 Anticoagulation history: Eliquis   Past Medical History:  Diagnosis Date   Anxiety    Arthritis    knees -generalized   Asthma    Bronchitis    Depression    Headache    past history - none at present   Heart murmur    Osteopenia    Perimenopausal vasomotor symptoms    Vertigo    tx. 6 months ago- no problems now,very mild occ.   Past Surgical History:  Procedure Laterality Date   BREAST CYST ASPIRATION Right 2006   COLONOSCOPY WITH PROPOFOL N/A 09/18/2014   Procedure: COLONOSCOPY WITH PROPOFOL;  Surgeon: Garlan Fair, MD;  Location: [facility];  Service: Endoscopy;  Laterality: N/A;   NASAL SINUS SURGERY      Current Outpatient Medications  Medication Sig Dispense Refill   ALPRAZolam (XANAX) 0.25 MG tablet Take 0.25 mg by mouth 2 (two) times daily as needed for anxiety.     apixaban (ELIQUIS) 5 MG TABS tablet Take 1 tablet (5 mg total) by mouth 2 (two) times daily. 200 tablet 3   buPROPion (WELLBUTRIN XL) 150 MG 24 hr tablet Take 150 mg by mouth every morning.     cholecalciferol (VITAMIN D) 1000 units tablet Take 1,000 Units by mouth daily.     diltiazem (CARDIZEM) 30 MG tablet Take 1 tablet (30 mg  total) by mouth 4 (four) times daily as needed. 30 tablet 1   FLUoxetine (PROZAC) 20 MG capsule Take 40 mg by mouth every morning.     metoprolol succinate (TOPROL XL) 25 MG 24 hr tablet Take 1 tablet (25 mg total) by mouth daily. 30 tablet 3   simvastatin (ZOCOR) 40 MG tablet Take 40 mg by mouth every morning.     No current facility-administered medications for this encounter.    Allergies  Allergen Reactions   Codeine Nausea And Vomiting   Azithromycin Rash    Social History   Socioeconomic History   Marital status: Married    Spouse name: Not on file   Number of children:  Not on file   Years of education: Not on file   Highest education level: Not on file  Occupational History   Not on file  Tobacco Use   Smoking status: Former    Packs/day: 0.50    Years: 3.00    Total pack years: 1.50    Types: Cigarettes    Quit date: 05/26/1991    Years since quitting: 31.0    Passive exposure: Past   Smokeless tobacco: Never   Tobacco comments:    Former smoker 04/29/22  Vaping Use   Vaping Use: Never used  Substance and Sexual Activity   Alcohol use: Not Currently   Drug use: No   Sexual activity: Yes    Partners: Male    Birth control/protection: Post-menopausal    Comment: 1st intercourse- 23, partners- 65, married- 68 yrs   Other Topics Concern   Not on file  Social History Narrative   Not on file   Social Determinants of Health   Financial Resource Strain: Not on file  Food Insecurity: Not on file  Transportation Needs: Not on file  Physical Activity: Not on file  Stress: Not on file  Social Connections: Not on file  Intimate Partner Violence: Not on file     ROS- All systems are reviewed and negative except as per the HPI above.  Physical Exam: Vitals:   06/08/22 0953  BP: (!) 152/66  Pulse: 63  Weight: 64.6 kg  Height: 5' 1.5" (1.562 m)    GEN- The patient is a well appearing female, alert and oriented x 3 today.   HEENT-head normocephalic, atraumatic, sclera clear, conjunctiva pink, hearing intact, trachea midline. Lungs- Clear to ausculation bilaterally, normal work of breathing Heart- Regular rate and rhythm, no murmurs, rubs or gallops  GI- soft, NT, ND, + BS Extremities- no clubbing, cyanosis, or edema MS- no significant deformity or atrophy Skin- no rash or lesion Psych- euthymic mood, full affect Neuro- strength and sensation are intact   Wt Readings from Last 3 Encounters:  06/08/22 64.6 kg  04/29/22 63.2 kg  03/16/22 66.2 kg    EKG today demonstrates  SR, PVC Vent. rate 63 BPM PR interval 158 ms QRS duration  82 ms QT/QTcB 420/429 ms  Echo 11/05/17 demonstrated  - Left ventricle: The cavity size was normal. Wall thickness was    normal. Systolic function was normal. The estimated ejection    fraction was in the range of 60% to 65%. Wall motion was normal;    there were no regional wall motion abnormalities. Features are    consistent with a pseudonormal left ventricular filling pattern,    with concomitant abnormal relaxation and increased filling    pressure (grade 2 diastolic dysfunction). GLS -22.6% (normal).  - Aortic valve: There was no stenosis.  -  Mitral valve: There was trivial regurgitation.  - Left atrium: The atrium was mildly dilated.  - Right ventricle: The cavity size was normal. Systolic function    was normal.  - Tricuspid valve: Peak RV-RA gradient (S): 29 mm Hg.  - Pulmonary arteries: PA peak pressure: 32 mm Hg (S).  - Inferior vena cava: The vessel was normal in size. The    respirophasic diameter changes were in the normal range (>= 50%),    consistent with normal central venous pressure.  - Pericardium, extracardiac: A trivial pericardial effusion was    identified.   Impressions:   - Normal LV size with EF 60-65%. Moderate diastolic dysfunction.    Normal RV size and systolic function. No significant valvular    abnormalities.   Epic records are reviewed at length today  CHA2DS2-VASc Score = 2  The patient's score is based upon: CHF History: 0 HTN History: 0 Diabetes History: 0 Stroke History: 0 Vascular Disease History: 0 Age Score: 1 Gender Score: 1       ASSESSMENT AND PLAN: 1. Paroxysmal Atrial Fibrillation (ICD10:  I48.0) The patient's CHA2DS2-VASc score is 2, indicating a 2.2% annual risk of stroke.   Patient is scheduled to discuss ablation with Dr Curt Bears on 07/03/22. We discussed interim rhythm management with an AAD (flecainide, Multaq). She would like to continue her present therapy for now. Would start flecainide if she has more afib  episodes. Check echocardiogram  Continue Toprol 25 mg daily Continue Eliquis 5 mg BID Continue diltiazem 30 mg PRN q 4 hours Apple Watch for home monitoring.   2. Chest discomfort Patient have chest discomfort with both typical and atypical features. ECG shows no acute changes today.  Would like to evaluate to rule out ischemia prior to flecainide initiation.  Will d/w her primary cardiologist regarding modality.    Follow up with Dr Curt Bears and Dr Marlou Porch as scheduled.    [facility] [address] [place], [place] [address] [phone] 06/08/2022 10:19 AM

## 2022-06-12 ENCOUNTER — Encounter (HOSPITAL_COMMUNITY): Payer: Self-pay

## 2022-06-12 ENCOUNTER — Telehealth (HOSPITAL_COMMUNITY): Payer: Self-pay | Admitting: *Deleted

## 2022-06-12 NOTE — Telephone Encounter (Signed)
Reaching out to patient to offer assistance regarding upcoming cardiac imaging study; pt verbalizes understanding of appt date/time, parking situation and where to check in, pre-test NPO status and verified current allergies; name and call back number provided for further questions should they arise  Gordy Clement RN Navigator Cardiac [facility] [phone] office [phone] cell  Patient aware to arrive at 8am.

## 2022-06-15 ENCOUNTER — Ambulatory Visit (HOSPITAL_COMMUNITY)
Admission: RE | Admit: 2022-06-15 | Discharge: 2022-06-15 | Disposition: A | Discharge status: Home / Self Care | Payer: Medicare Other | Source: Ambulatory Visit | Attending: Physician Assistant | Admitting: Physician Assistant

## 2022-06-15 ENCOUNTER — Encounter (HOSPITAL_COMMUNITY): Payer: Self-pay

## 2022-06-15 DIAGNOSIS — R079 Chest pain, unspecified: Secondary | ICD-10-CM

## 2022-06-15 MED ORDER — NITROGLYCERIN 0.4 MG SL SUBL: 0.8000 mg | SUBLINGUAL_TABLET | Freq: Once | SUBLINGUAL | Status: DC

## 2022-06-15 NOTE — Progress Notes (Signed)
Patient here today at [facility] for CT coronary study. Patient has hs of A-fib and takes Toprol XL and cardizem PRN. Attached to cardiac monitor and shows sinus brady  rate 47 with PACS. Patient proceeded to go into ventricular bigeminy. Notified Dr. Aundra Dubin who mentions to attempt scan. Legrand Como CT Tech was called over to analyze rhythm and suggests to cancel patient due to irregular heart beat. Patient is understanding of cancellation at this time. Reached out to heart navigator for further instructions. Patient ambulatory at discharge.

## 2022-06-16 DIAGNOSIS — I480 Paroxysmal atrial fibrillation: Secondary | ICD-10-CM | POA: Diagnosis not present

## 2022-06-16 DIAGNOSIS — J449 Chronic obstructive pulmonary disease, unspecified: Secondary | ICD-10-CM | POA: Diagnosis not present

## 2022-06-16 DIAGNOSIS — R0789 Other chest pain: Secondary | ICD-10-CM | POA: Diagnosis not present

## 2022-06-18 ENCOUNTER — Encounter (HOSPITAL_COMMUNITY): Payer: Self-pay

## 2022-06-19 ENCOUNTER — Ambulatory Visit: Payer: No Typology Code available for payment source | Admitting: Adult Health

## 2022-06-25 ENCOUNTER — Ambulatory Visit (HOSPITAL_COMMUNITY)
Admission: RE | Admit: 2022-06-25 | Discharge: 2022-06-25 | Disposition: A | Discharge status: Home / Self Care | Payer: Medicare Other | Source: Ambulatory Visit | Attending: Physician Assistant | Admitting: Physician Assistant

## 2022-06-25 DIAGNOSIS — I081 Rheumatic disorders of both mitral and tricuspid valves: Secondary | ICD-10-CM | POA: Insufficient documentation

## 2022-06-25 DIAGNOSIS — I480 Paroxysmal atrial fibrillation: Secondary | ICD-10-CM | POA: Diagnosis not present

## 2022-06-25 DIAGNOSIS — E785 Hyperlipidemia, unspecified: Secondary | ICD-10-CM | POA: Diagnosis not present

## 2022-06-25 LAB — ECHOCARDIOGRAM COMPLETE
Area-P 1/2: 3.51 cm2
S' Lateral: 2.6 cm

## 2022-06-25 NOTE — Progress Notes (Signed)
  Echocardiogram 2D Echocardiogram has been performed.  Leah Reeves 06/25/2022, 12:09 PM

## 2022-06-26 ENCOUNTER — Encounter (HOSPITAL_COMMUNITY): Payer: Self-pay | Admitting: *Deleted

## 2022-07-01 IMAGING — MG MM DIGITAL SCREENING BILAT W/ TOMO AND CAD
8 series · 9 of 24 positions shown · non-contrast
Comparison: Previous exam(s).

CLINICAL DATA: Screening.

EXAM:
DIGITAL SCREENING BILATERAL MAMMOGRAM WITH TOMOSYNTHESIS AND CAD
TECHNIQUE: Bilateral screening digital craniocaudal and mediolateral oblique
mammograms were obtained. Bilateral screening digital breast
tomosynthesis was performed. The images were evaluated with
computer-aided detection.

[L MLO synth-2D]
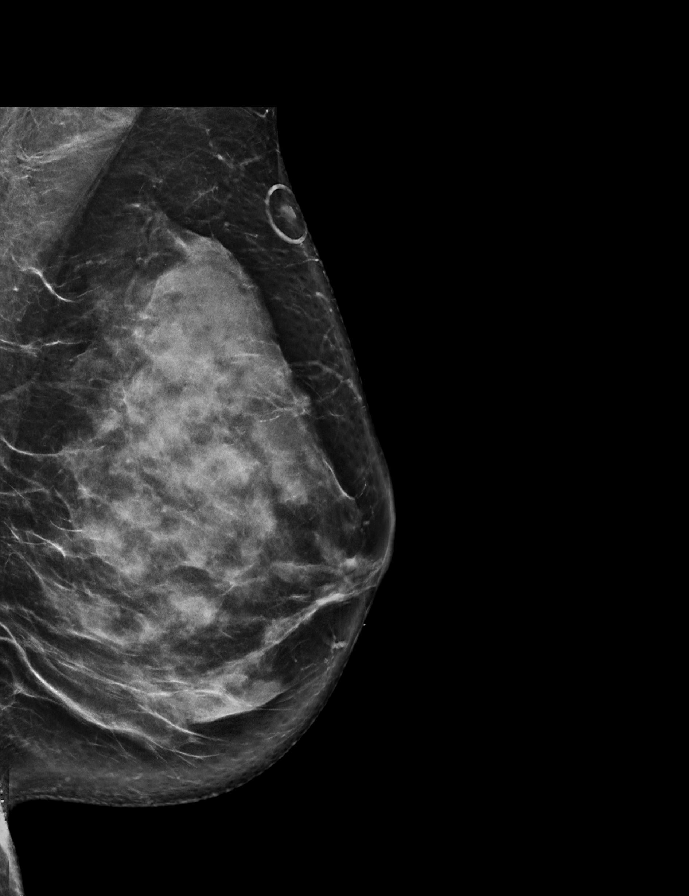

[R CC synth-2D]
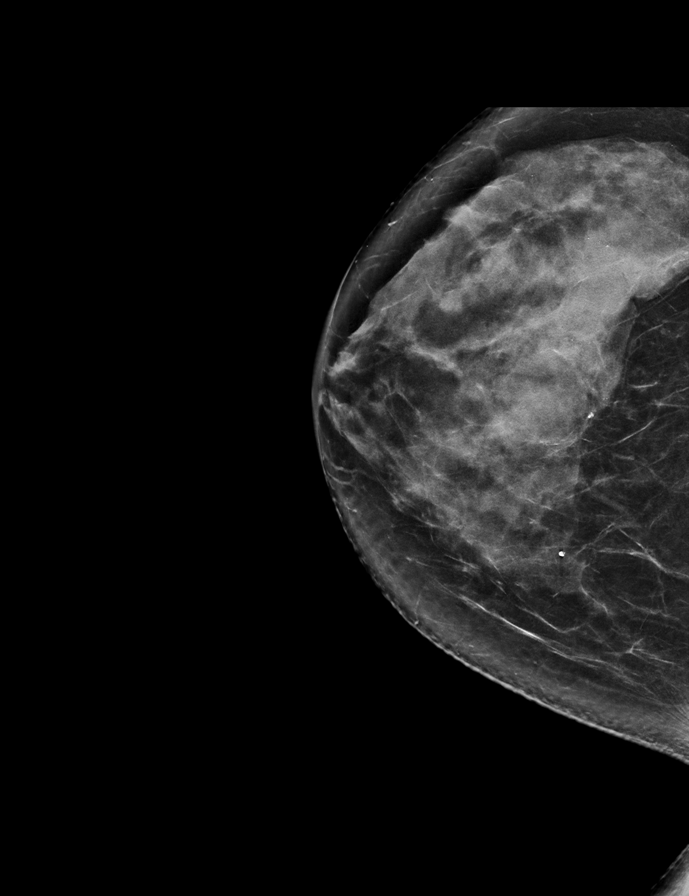

[L CC synth-2D]
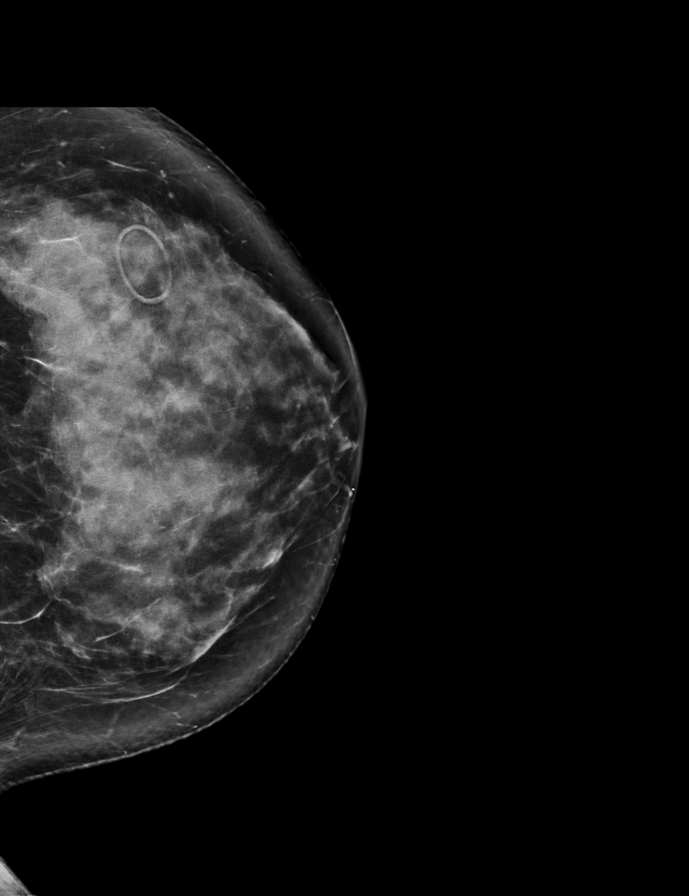

[R MLO synth-2D]
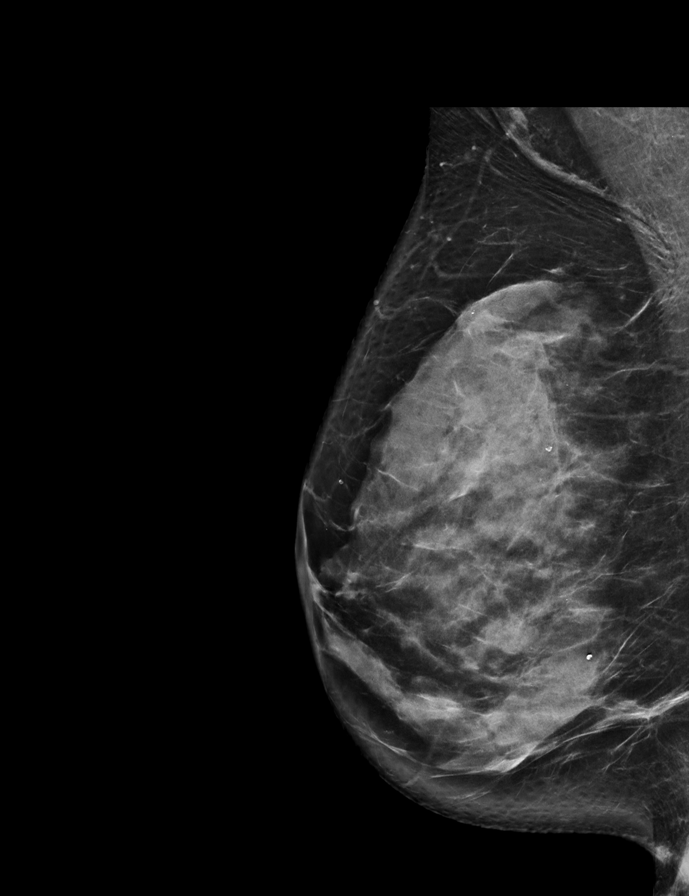

[R CC tomo · 2 of 74 frames shown]
[frame 24/74]
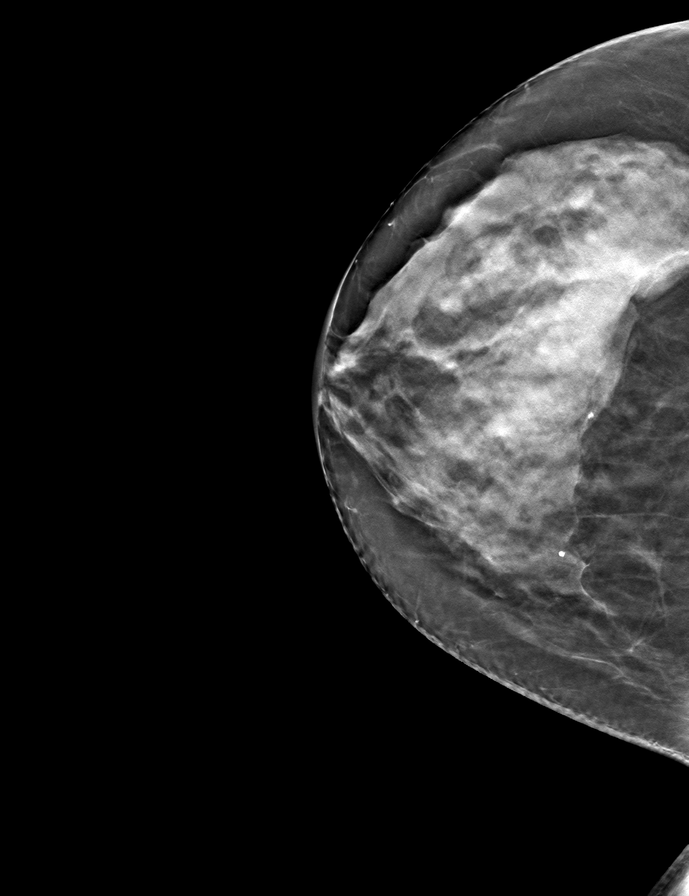
[frame 37/74]
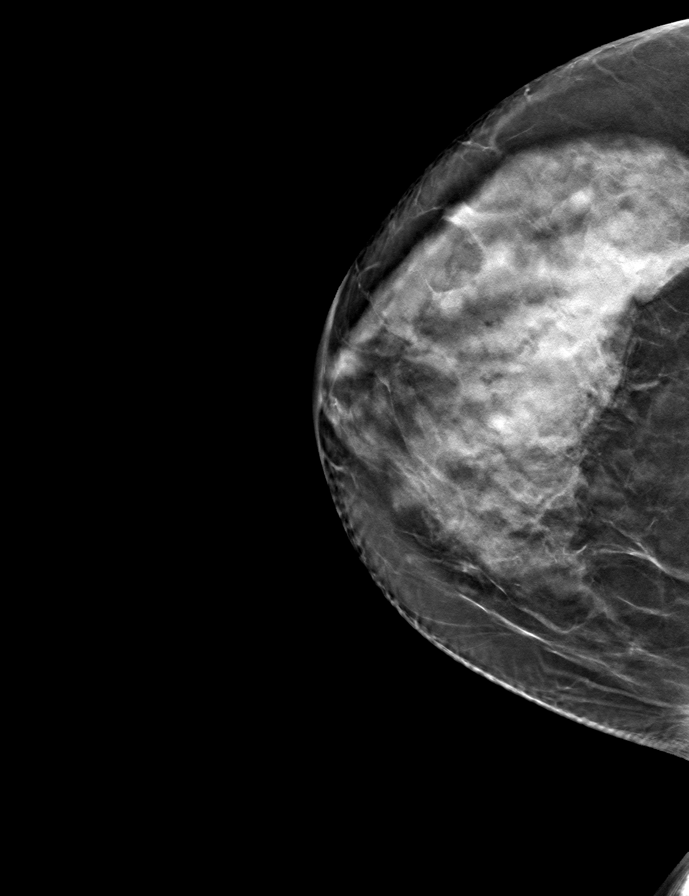

[L CC tomo · tomo slice 38/75.0]
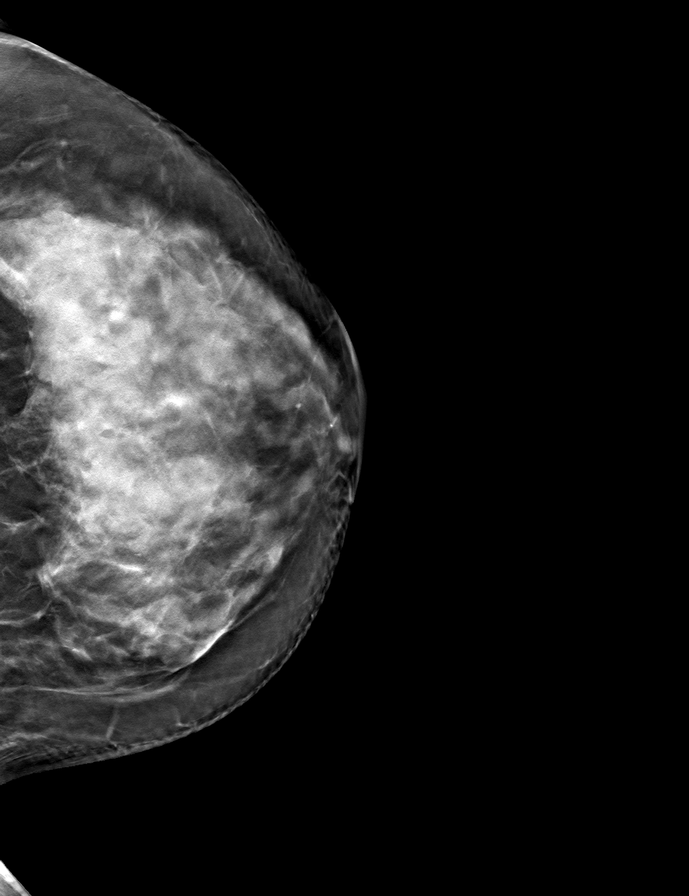

[L MLO tomo · tomo slice 41/80.0]
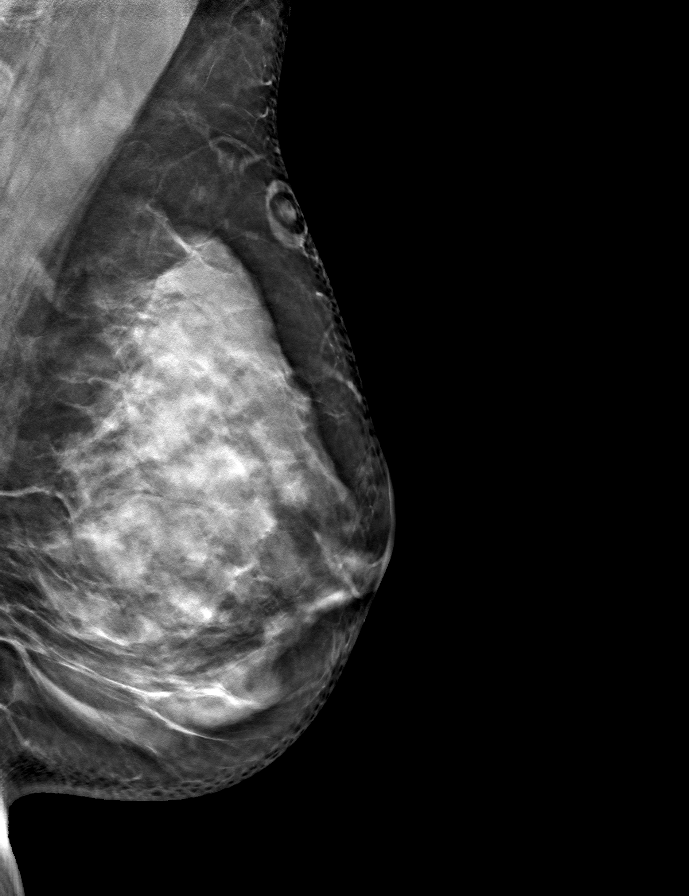

[R MLO tomo · tomo slice 39/78.0]
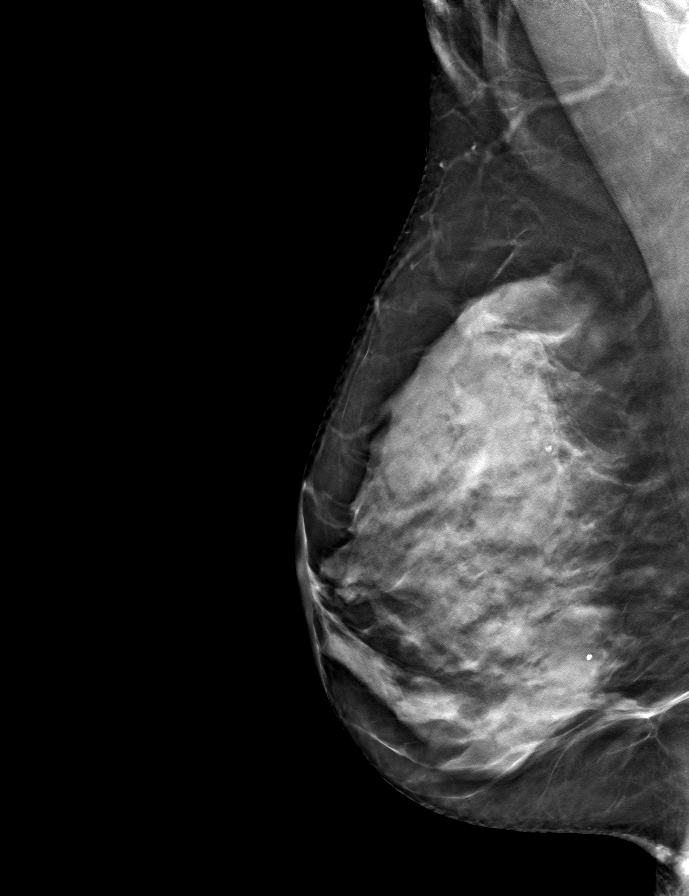

[9 of 24 positions shown; findings below may reference images not displayed]

ACR Breast Density Category d: The breast tissue is extremely dense,
which lowers the sensitivity of mammography.
FINDINGS: In the right breast, a possible asymmetry warrants further
evaluation. In the left breast, no findings suspicious for
malignancy.
IMPRESSION: Further evaluation is suggested for possible asymmetry in the right
breast.

RECOMMENDATION:
Diagnostic mammogram and possibly ultrasound of the right breast.
(Code:[id number]-G-IIX)

The patient will be contacted regarding the findings, and additional
imaging will be scheduled.

BI-RADS CATEGORY  0: Incomplete. Need additional imaging evaluation
and/or prior mammograms for comparison.

## 2022-07-03 ENCOUNTER — Ambulatory Visit (INDEPENDENT_AMBULATORY_CARE_PROVIDER_SITE_OTHER): Payer: Medicare Other

## 2022-07-03 ENCOUNTER — Ambulatory Visit: Payer: Medicare Other | Attending: Cardiology | Admitting: Cardiology

## 2022-07-03 ENCOUNTER — Encounter: Payer: Self-pay | Admitting: Cardiology

## 2022-07-03 VITALS — BP 140/82 | HR 66 | Ht 61.5 in | Wt 142.0 lb

## 2022-07-03 DIAGNOSIS — I480 Paroxysmal atrial fibrillation: Secondary | ICD-10-CM | POA: Diagnosis not present

## 2022-07-03 DIAGNOSIS — R079 Chest pain, unspecified: Secondary | ICD-10-CM

## 2022-07-03 DIAGNOSIS — I493 Ventricular premature depolarization: Secondary | ICD-10-CM

## 2022-07-03 DIAGNOSIS — D6869 Other thrombophilia: Secondary | ICD-10-CM

## 2022-07-03 NOTE — Progress Notes (Unsigned)
Enrolled patient for a 7 day Zio XT monitor to be mailed to patients home.  

## 2022-07-03 NOTE — Patient Instructions (Signed)
Medication Instructions:  Your physician recommends that you continue on your current medications as directed. Please refer to the Current Medication list given to you today.  *If you need a refill on your cardiac medications before your next appointment, please call your pharmacy*   Lab Work: None ordered If you have labs (blood work) drawn today and your tests are completely normal, you will receive your results only by: [place] (if you have MyChart) OR A paper copy in the mail If you have any lab test that is abnormal or we need to change your treatment, we will call you to review the results.   Testing/Procedures: Your physician has recommended a cardiac PET scan.  Someone will call you to schedule this testing.                             ZIO XT- Long Term Monitor Instructions  Your physician has requested you wear a ZIO patch monitor for 14 days.  This is a single patch monitor. [employer] supplies one patch monitor per enrollment. Additional stickers are not available. Please do not apply patch if you will be having a Nuclear Stress Test,  Echocardiogram, Cardiac CT, MRI, or Chest Xray during the period you would be wearing the  monitor. The patch cannot be worn during these tests. You cannot remove and re-apply the  ZIO XT patch monitor.  Your ZIO patch monitor will be mailed 3 day USPS to your address on file. It may take 3-5 days  to receive your monitor after you have been enrolled.  Once you have received your monitor, please review the enclosed instructions. Your monitor  has already been registered assigning a specific monitor serial # to you.  Billing and Patient Assistance Program Information  We have supplied [employer] with any of your insurance information on file for billing purposes. [employer] offers a sliding scale Patient Assistance Program for patients that do not have  insurance, or whose insurance does not completely cover the cost of the ZIO monitor.   You must apply for the Patient Assistance Program to qualify for this discounted rate.  To apply, please call [employer] at [phone], select option 4, select option 2, ask to apply for  Patient Assistance Program. Theodore Demark will ask your household income, and how many people  are in your household. They will quote your out-of-pocket cost based on that information.  [employer] will also be able to set up a 69-month interest-free payment plan if needed.  Applying the monitor   Shave hair from upper left chest.  Hold abrader disc by orange tab. Rub abrader in 40 strokes over the upper left chest as  indicated in your monitor instructions.  Clean area with 4 enclosed alcohol pads. Let dry.  Apply patch as indicated in monitor instructions. Patch will be placed under collarbone on left  side of chest with arrow pointing upward.  Rub patch adhesive wings for 2 minutes. Remove white label marked "1". Remove the white  label marked "2". Rub patch adhesive wings for 2 additional minutes.  While looking in a mirror, press and release button in center of patch. A small green light will  flash 3-4 times. This will be your only indicator that the monitor has been turned on.  Do not shower for the first 24 hours. You may shower after the first 24 hours.  Press the button if you feel a symptom. You will hear a small  click. Record Date, Time and  Symptom in the Patient Logbook.  When you are ready to remove the patch, follow instructions on the last 2 pages of Patient  Logbook. Stick patch monitor onto the last page of Patient Logbook.  Place Patient Logbook in the blue and white box. Use locking tab on box and tape box closed  securely. The blue and white box has prepaid postage on it. Please place it in the mailbox as  soon as possible. Your physician should have your test results approximately 7 days after the  monitor has been mailed back to [facility].  Call [place] at  [phone] if you have questions regarding  your ZIO XT patch monitor. Call them immediately if you see an orange light blinking on your  monitor.  If your monitor falls off in less than 4 days, contact our Monitor department at [phone].  If your monitor becomes loose or falls off after 4 days call [employer] at [phone] for  suggestions on securing your monitor    Follow-Up: At [facility], you and your health needs are our priority.  As part of our continuing mission to provide you with exceptional heart care, we have created designated Provider Care Teams.  These Care Teams include your primary Cardiologist (physician) and Advanced Practice Providers (APPs -  Physician Assistants and Nurse Practitioners) who all work together to provide you with the care you need, when you need it.   Your next appointment:   To be  determined  The format for your next appointment:   In Person  Provider:   You will follow up in the [facility] located at [facility]. Your provider will be: Roderic Palau, NP or Clint R. Fenton, PA-C    Thank you for choosing [facility]!!   Trinidad Curet, RN [phone]  Other Instructions  How to Prepare for Your Cardiac PET/CT Stress Test:  1. Please do not take these medications before your test:   Medications that may interfere with the cardiac pharmacological stress agent (ex. nitrates - including erectile dysfunction medications, isosorbide mononitrate, tamulosin or beta-blockers) the day of the exam. (Erectile dysfunction medication should be held for at least 72 hrs prior to test) Theophylline containing medications for 12 hours. Dipyridamole 48 hours prior to the test. Your remaining medications may be taken with water.  2. Nothing to eat or drink, except water, 3 hours prior to arrival time.   NO caffeine/decaffeinated products, or chocolate 12 hours prior to arrival.  3. NO perfume, cologne or  lotion  4. Total time is 1 to 2 hours; you may want to bring reading material for the waiting time.  5. Please report to Radiology at the [facility] Main Entrance 30 minutes early for your test.  [place], [place] [id number]  Diabetic Preparation:  Hold oral medications. You may take NPH and Lantus insulin. Do not take Humalog or Humulin R (Regular Insulin) the day of your test. Check blood sugars prior to leaving the house. If able to eat breakfast prior to 3 hour fasting, you may take all medications, including your insulin, Do not worry if you miss your breakfast dose of insulin - start at your next meal.  IF YOU THINK YOU MAY BE PREGNANT, OR ARE NURSING PLEASE INFORM THE TECHNOLOGIST.  In preparation for your appointment, medication and supplies will be purchased.  Appointment availability is limited, so if you need to cancel or reschedule,  please call the Radiology Department at [phone]  24 hours in advance to avoid a cancellation fee of $100.00  What to Expect After you Arrive:  Once you arrive and check in for your appointment, you will be taken to a preparation room within the Radiology Department.  A technologist or Nurse will obtain your medical history, verify that you are correctly prepped for the exam, and explain the procedure.  Afterwards,  an IV will be started in your arm and electrodes will be placed on your skin for EKG monitoring during the stress portion of the exam. Then you will be escorted to the PET/CT scanner.  There, staff will get you positioned on the scanner and obtain a blood pressure and EKG.  During the exam, you will continue to be connected to the EKG and blood pressure machines.  A small, safe amount of a radioactive tracer will be injected in your IV to obtain a series of pictures of your heart along with an injection of a stress agent.    After your Exam:  It is recommended that you eat a meal and drink a caffeinated  beverage to counter act any effects of the stress agent.  Drink plenty of fluids for the remainder of the day and urinate frequently for the first couple of hours after the exam.  Your doctor will inform you of your test results within 7-10 business days.  For questions about your test or how to prepare for your test, please call: Marchia Bond, Cardiac Imaging Nurse Navigator  Gordy Clement, Cardiac Imaging Nurse Navigator Office: [phone]

## 2022-07-03 NOTE — Progress Notes (Signed)
Electrophysiology Office Note   Date:  07/03/2022   ID:  [name], DOB 04-Dec-1950, MRN [id number]  PCP:  Leah Cha, MD  Cardiologist:  Leah Reeves Primary Electrophysiologist:  Leah Reeves Leah Leeds, MD    Chief Complaint: AF   History of Present Illness: Leah Reeves is a 72 y.o. female who is being seen today for the evaluation of AF at the request of Leah Pain, MD. Presenting today for electrophysiology evaluation.  She has a history stated for hyperlipidemia and atrial fibrillation.  She presented to emergency room 10/26/2017 with atrial fibrillation and rapid rates.  She had done well until December 2023 when she awoke with palpitations and fatigue.  She was noted to be in atrial fibrillation.  Over the last few weeks, her atrial fibrillation burden has increased.  Today, she denies symptoms of palpitations, shortness of breath, orthopnea, PND, lower extremity edema, claudication, dizziness, presyncope, syncope, bleeding, or neurologic sequela. The patient is tolerating medications without difficulties.  She feels well today.  She does note increasing burden of atrial fibrillation confirmed by her Apple Watch.  She feels weak and fatigued when she is in atrial fibrillation.  She does not have much shortness of breath.  She is also been having chest discomfort.  Chest Reeves is mainly in the mornings.  Not necessarily related to exertion.   Past Medical History:  Diagnosis Date   Anxiety    Arthritis    knees -generalized   Asthma    Bronchitis    Depression    Headache    past history - none at present   Heart murmur    Osteopenia    Perimenopausal vasomotor symptoms    Vertigo    tx. 6 months ago- no problems now,very mild occ.   Past Surgical History:  Procedure Laterality Date   BREAST CYST ASPIRATION Right 2006   COLONOSCOPY WITH PROPOFOL N/A 09/18/2014   Procedure: COLONOSCOPY WITH PROPOFOL;  Surgeon: Leah Fair, MD;  Location: [facility]  ENDOSCOPY;  Service: Endoscopy;  Laterality: N/A;   NASAL SINUS SURGERY       Current Outpatient Medications  Medication Sig Dispense Refill   ALPRAZolam (XANAX) 0.25 MG tablet Take 0.25 mg by mouth 2 (two) times daily as needed for anxiety.     apixaban (ELIQUIS) 5 MG TABS tablet Take 1 tablet (5 mg total) by mouth 2 (two) times daily. 200 tablet 3   Ascorbic Acid (VITAMIN C) 500 MG CHEW 500 mg daily.     buPROPion (WELLBUTRIN XL) 150 MG 24 hr tablet Take 150 mg by mouth every morning.     cholecalciferol (VITAMIN D) 1000 units tablet Take 1,000 Units by mouth daily.     diltiazem (CARDIZEM) 30 MG tablet Take 1 tablet (30 mg total) by mouth 4 (four) times daily as needed. 30 tablet 1   FLUoxetine (PROZAC) 20 MG capsule Take 40 mg by mouth every morning.     guaiFENesin (MUCINEX) 600 MG 12 hr tablet Take 600 mg by mouth 2 (two) times daily.     metoprolol succinate (TOPROL XL) 25 MG 24 hr tablet Take 1 tablet (25 mg total) by mouth daily. 30 tablet 3   nitroGLYCERIN (NITROSTAT) 0.4 MG SL tablet Place 0.4 mg under the tongue every 5 (five) minutes as needed for chest Reeves.     simvastatin (ZOCOR) 40 MG tablet Take 40 mg by mouth every morning.     No current facility-administered medications for this visit.  Allergies:   Codeine and Azithromycin   Social History:  The patient  reports that she quit smoking about 31 years ago. Her smoking use included cigarettes. She has a 1.50 pack-year smoking history. She has been exposed to tobacco smoke. She has never used smokeless tobacco. She reports that she does not currently use alcohol. She reports that she does not use drugs.   Family History:  The patient's family history includes Diabetes in her mother and sister; Heart disease in her father and sister; Hypertension in her mother.    ROS:  Please see the history of present illness.   Otherwise, review of systems is positive for none.   All other systems are reviewed and negative.     PHYSICAL EXAM: VS:  BP (!) 140/82   Pulse 66   Ht 5' 1.5" (1.562 m)   Wt 142 lb (64.4 kg)   LMP 05/25/2008   SpO2 98%   BMI 26.40 kg/m  , BMI Body mass index is 26.4 kg/m. GEN: Well nourished, well developed, in no acute distress  HEENT: normal  Neck: no JVD, carotid bruits, or masses Cardiac: RRR; no murmurs, rubs, or gallops,no edema  Respiratory:  clear to auscultation bilaterally, normal work of breathing GI: soft, nontender, nondistended, + BS MS: no deformity or atrophy  Skin: warm and dry Neuro:  Strength and sensation are intact Psych: euthymic mood, full affect  EKG:  EKG is ordered today. Personal review of the ekg ordered shows sinus rhythm, PVCs   Recent Labs: No results found for requested labs within last 365 days.    Lipid Panel     Component Value Date/Time   CHOL 170 11/09/2011 0952   HDL 43 11/09/2011 0952     Wt Readings from Last 3 Encounters:  07/03/22 142 lb (64.4 kg)  06/08/22 142 lb 6.4 oz (64.6 kg)  04/29/22 139 lb 6.4 oz (63.2 kg)      Other studies Reviewed: Additional studies/ records that were reviewed today include: TTE 06/25/22  Review of the above records today demonstrates:   1. Left ventricular ejection fraction, by estimation, is 55 to 60%. The  left ventricle has normal function. The left ventricle has no regional  wall motion abnormalities. Left ventricular diastolic parameters were  normal.   2. Right ventricular systolic function is normal. The right ventricular  size is normal. There is normal pulmonary artery systolic pressure.   3. Left atrial size was mildly dilated.   4. The mitral valve is abnormal. Mild mitral valve regurgitation. No  evidence of mitral stenosis.   5. The aortic valve is tricuspid. Aortic valve regurgitation is not  visualized. No aortic stenosis is present.   6. The inferior vena cava is normal in size with greater than 50%  respiratory variability, suggesting right atrial pressure of 3  mmHg.    ASSESSMENT AND PLAN:  1.  Paroxysmal atrial fibrillation: CHA2DS2-VASc of 2.  Currently on Eliquis, Toprol, diltiazem, doses above.  She is continue to have episodes of atrial fibrillation.  She would benefit from a rhythm control strategy.  She would prefer a medication control strategy initially.  Ablation was offered.  As she is having chest Reeves, we Freddie Dymek plan for cardiac PET to rule out coronary artery disease.  If no coronary artery disease is found, Leonilda Cozby need to be started on flecainide.  2.  Secondary hypercoagulable state: Currently on Eliquis for atrial fibrillation as above  3.  PVCs: Elevated burden on current ECG.  Robet Crutchfield plan for a 7-day monitor.  4.  Chest Reeves: Has both typical and atypical features.  She was not able to get a coronary CT due to PVCs.  Fantasy Donald plan for cardiac PET scan.  Discussed with primary cardiology  Current medicines are reviewed at length with the patient today.   The patient does not have concerns regarding her medicines.  The following changes were made today:  none  Labs/ tests ordered today include:  Orders Placed This Encounter  Procedures   NM PET CT CARDIAC PERFUSION MULTI W/ABSOLUTE BLOODFLOW   LONG TERM MONITOR (3-14 DAYS)   EKG 12-Lead     Disposition:   FU 3 months  Signed, Shanen Norris Leah Leeds, MD  07/03/2022 9:50 AM     [facility] [address] [place] [address] [phone] (office) [phone] (fax)

## 2022-07-08 DIAGNOSIS — I493 Ventricular premature depolarization: Secondary | ICD-10-CM | POA: Diagnosis not present

## 2022-07-27 ENCOUNTER — Telehealth (HOSPITAL_COMMUNITY): Payer: Self-pay | Admitting: *Deleted

## 2022-07-27 NOTE — Telephone Encounter (Signed)
Reaching out to patient to offer assistance regarding upcoming cardiac imaging study; pt verbalizes understanding of appt date/time, parking situation and where to check in, pre-test NPO status and verified current allergies; name and call back number provided for further questions should they arise  Gordy Clement RN Navigator Cardiac Imaging [facility] [phone] office [phone] cell  Patient aware to avoid caffeine 12 hours prior to her cardiac PET scan.

## 2022-07-29 ENCOUNTER — Ambulatory Visit (HOSPITAL_COMMUNITY)
Admission: RE | Admit: 2022-07-29 | Discharge: 2022-07-29 | Disposition: A | Discharge status: Home / Self Care | Payer: Medicare Other | Source: Ambulatory Visit | Attending: Cardiology | Admitting: Cardiology

## 2022-07-29 DIAGNOSIS — R079 Chest pain, unspecified: Secondary | ICD-10-CM | POA: Diagnosis not present

## 2022-07-29 DIAGNOSIS — I493 Ventricular premature depolarization: Secondary | ICD-10-CM | POA: Diagnosis not present

## 2022-07-29 LAB — NM PET CT CARDIAC PERFUSION MULTI W/ABSOLUTE BLOODFLOW
LV dias vol: 85 mL (ref 46–106)
LV sys vol: 33 mL
MBFR: 2.07
Nuc Rest EF: 58 %
Nuc Stress EF: 61 %
Rest MBF: 0.76 ml/g/min
Rest Nuclear Isotope Dose: 16.9 mCi
ST Depression (mm): 0 mm
Stress MBF: 1.57 ml/g/min
Stress Nuclear Isotope Dose: 16.5 mCi
TID: 1.22

## 2022-07-29 MED ORDER — REGADENOSON 0.4 MG/5ML IV SOLN
INTRAVENOUS | Status: AC
  Filled 2022-07-29: qty 5

## 2022-07-29 MED ORDER — RUBIDIUM RB82 GENERATOR (RUBYFILL)
16.9000 | PACK | Freq: Once | INTRAVENOUS | Status: AC
  Administered 2022-07-29: 16.9 via INTRAVENOUS

## 2022-07-29 MED ORDER — RUBIDIUM RB82 GENERATOR (RUBYFILL)
16.9000 | PACK | Freq: Once | INTRAVENOUS | Status: AC
  Administered 2022-07-29: 16.5 via INTRAVENOUS

## 2022-07-31 DIAGNOSIS — I493 Ventricular premature depolarization: Secondary | ICD-10-CM | POA: Diagnosis not present

## 2022-08-04 MED FILL — Regadenoson IV Inj 0.4 MG/5ML (0.08 MG/ML): INTRAVENOUS | Qty: 5 | Status: AC

## 2022-08-05 ENCOUNTER — Telehealth: Payer: Self-pay | Admitting: Cardiology

## 2022-08-05 NOTE — Telephone Encounter (Signed)
Patient called for the results to her long term monitor.

## 2022-08-05 NOTE — Telephone Encounter (Signed)
Pt aware Dr. Curt Bears has not read study yet, but he should be reading it shortly or tomorrow. Aware I will follow up soon with this finding and PET scan finding.

## 2022-08-07 ENCOUNTER — Telehealth: Payer: Self-pay | Admitting: Cardiology

## 2022-08-07 ENCOUNTER — Other Ambulatory Visit (HOSPITAL_COMMUNITY): Payer: Self-pay | Admitting: Physician Assistant

## 2022-08-07 MED ORDER — METOPROLOL SUCCINATE ER 25 MG PO TB24: 25.0000 mg | ORAL_TABLET | Freq: Every day | ORAL | 11 refills | Status: DC

## 2022-08-07 MED ORDER — FLECAINIDE ACETATE 50 MG PO TABS: 50.0000 mg | ORAL_TABLET | Freq: Two times a day (BID) | ORAL | 3 refills | Status: DC

## 2022-08-07 NOTE — Telephone Encounter (Signed)
Pt's medication was sent to pt's pharmacy as requested. Confirmation received.  °

## 2022-08-07 NOTE — Telephone Encounter (Signed)
Patient states she went in to [facility] 3/13 around 6pm and still is currently in afib. She states it running in the 80s. Please advise   Patient c/o Palpitations:  High priority if patient c/o lightheadedness, shortness of breath, or chest pain  How long have you had palpitations/irregular HR/ Afib? Are you having the symptoms now? Yes   Are you currently experiencing lightheadedness, SOB or CP? No   Do you have a history of afib (atrial fibrillation) or irregular heart rhythm? yes  Have you checked your BP or HR? (document readings if available):    Are you experiencing any other symptoms?

## 2022-08-07 NOTE — Telephone Encounter (Signed)
*  STAT* If patient is at the pharmacy, call can be transferred to refill team.   1. Which medications need to be refilled? (please list name of each medication and dose if known) metoprolol succinate (TOPROL XL) 25 MG 24 hr tablet   2. Which pharmacy/location (including street and city if local pharmacy) is medication to be sent to? [id number] - [place], [place] - [address]   3. Do they need a 30 day or 90 day supply? 7 days supply  (Patient states she ran out last night, just need enough till Dr. Curt Bears can get the info together)

## 2022-08-07 NOTE — Telephone Encounter (Signed)
Pt reports being in afib for almost 2 days now (per apple watch). HRs avg 70-80s, highest 95. Denies CP, SOB, dizziness/light headedness, syncope. The "only thing I have noticed was her left hand was tingly" but this was only once.  He allergies are also starting. Reports that she can still perform her daily activities.     Aware will forward to MD for advisement.   Informed that he should look at her monitor today as well. Patient verbalized understanding and agreeable to plan.

## 2022-08-07 NOTE — Telephone Encounter (Signed)
Pt advised to start Flecainide 50 mg BID per Dr. Curt Bears. Reviewed medication w/ pt Advised to call office if SE occur after starting. Advised to keep June follow up in the afib clinic. Patient verbalized understanding and agreeable to plan.

## 2022-08-10 ENCOUNTER — Telehealth: Payer: Self-pay | Admitting: Cardiology

## 2022-08-10 NOTE — Telephone Encounter (Signed)
Pt reports that she researched to Flecainide over the weekend and did not start it due to concern after reading about it. We further discussed this, informed of other antiarrhythmics -- pt prefers to start/try Flecainide. She appreciates the education. She will start the Flecainide tonight/tomorrow morning.

## 2022-08-10 NOTE — Telephone Encounter (Signed)
Pt would like a callback regarding Meds and recent testing. Please advise.

## 2022-10-05 ENCOUNTER — Ambulatory Visit: Payer: Medicare Other | Attending: Cardiology | Admitting: Cardiology

## 2022-10-05 ENCOUNTER — Encounter: Payer: Self-pay | Admitting: Cardiology

## 2022-10-05 VITALS — BP 124/68 | HR 64 | Ht 64.0 in | Wt 142.0 lb

## 2022-10-05 DIAGNOSIS — I480 Paroxysmal atrial fibrillation: Secondary | ICD-10-CM

## 2022-10-05 DIAGNOSIS — I493 Ventricular premature depolarization: Secondary | ICD-10-CM | POA: Diagnosis not present

## 2022-10-05 NOTE — Progress Notes (Signed)
Cardiology Office Note:    Date:  10/05/2022   ID:  Leah Reeves, DOB 12/03/1950, MRN [id number]  PCP:  Lorenda Ishihara, MD   [facility] Providers Cardiologist:  Donato Schultz, MD     Referring MD: Lorenda Ishihara,*    History of Present Illness:    Leah Reeves is a 72 y.o. female here for follow-up atrial fibrillation.  Has hyperlipidemia.  Back in 2019 she was in the ER with A-fib RVR.  Back in December 2023 she felt palpitations and was noted to be in A-fib.  Atrial fibrillation was confirmed by echo.  She does feel some weakness and fatigue when she is in this.  Chest pain usually in the mornings.  Not necessarily exertional.  Her PET scan cardiac was performed and was low risk no ischemia.  She was started on flecainide to help suppress her atrial fibrillation.  EKG on 10/05/2022 shows sinus rhythm 64 bpm QRS duration 92 ms.  Last episode 3/ 2024 on Watch.   Has had some more vivid dreams with metoprolol and Flecainide. Doing well. No CP, no SOB.   Worked in [employer].   Past Medical History:  Diagnosis Date   Anxiety    Arthritis    knees -generalized   Asthma    Bronchitis    Depression    Headache    past history - none at present   Heart murmur    Osteopenia    Perimenopausal vasomotor symptoms    Vertigo    tx. 6 months ago- no problems now,very mild occ.    Past Surgical History:  Procedure Laterality Date   BREAST CYST ASPIRATION Right 2006   COLONOSCOPY WITH PROPOFOL N/A 09/18/2014   Procedure: COLONOSCOPY WITH PROPOFOL;  Surgeon: Charolett Bumpers, MD;  Location: [facility];  Service: Endoscopy;  Laterality: N/A;   NASAL SINUS SURGERY      Current Medications: Current Meds  Medication Sig   ALPRAZolam (XANAX) 0.25 MG tablet Take 0.25 mg by mouth 2 (two) times daily as needed for anxiety.   apixaban (ELIQUIS) 5 MG TABS tablet Take 1 tablet (5 mg total) by mouth 2 (two) times daily.   Ascorbic Acid (VITAMIN C)  500 MG CHEW 500 mg daily.   buPROPion (WELLBUTRIN XL) 150 MG 24 hr tablet Take 150 mg by mouth every morning.   cholecalciferol (VITAMIN D) 1000 units tablet Take 1,000 Units by mouth daily.   diltiazem (CARDIZEM) 30 MG tablet Take 1 tablet (30 mg total) by mouth 4 (four) times daily as needed.   flecainide (TAMBOCOR) 50 MG tablet Take 1 tablet (50 mg total) by mouth 2 (two) times daily.   FLUoxetine (PROZAC) 20 MG capsule Take 40 mg by mouth every morning.   metoprolol succinate (TOPROL-XL) 25 MG 24 hr tablet TAKE 1 TABLET (25 MG TOTAL) BY MOUTH DAILY.   nitroGLYCERIN (NITROSTAT) 0.4 MG SL tablet Place 0.4 mg under the tongue every 5 (five) minutes as needed for chest pain.   simvastatin (ZOCOR) 40 MG tablet Take 40 mg by mouth every morning.     Allergies:   Codeine and Azithromycin   Social History   Socioeconomic History   Marital status: Married    Spouse name: Not on file   Number of children: Not on file   Years of education: Not on file   Highest education level: Not on file  Occupational History   Not on file  Tobacco Use   Smoking status: Former  Packs/day: 0.50    Years: 3.00    Additional pack years: 0.00    Total pack years: 1.50    Types: Cigarettes    Quit date: 05/26/1991    Years since quitting: 31.3    Passive exposure: Past   Smokeless tobacco: Never   Tobacco comments:    Former smoker 04/29/22  Vaping Use   Vaping Use: Never used  Substance and Sexual Activity   Alcohol use: Not Currently   Drug use: No   Sexual activity: Yes    Partners: Male    Birth control/protection: Post-menopausal    Comment: 1st intercourse- 23, partners- 3, married- 27 yrs   Other Topics Concern   Not on file  Social History Narrative   Not on file   Social Determinants of Health   Financial Resource Strain: Not on file  Food Insecurity: Not on file  Transportation Needs: Not on file  Physical Activity: Not on file  Stress: Not on file  Social Connections: Not on  file     Family History: The patient's family history includes Diabetes in her mother and sister; Heart disease in her father and sister; Hypertension in her mother.  ROS:   Please see the history of present illness.     All other systems reviewed and are negative.  EKGs/Labs/Other Studies Reviewed:    The following studies were reviewed today: Cardiac Studies & Procedures     STRESS TESTS  NM PET CT CARDIAC PERFUSION MULTI W/ABSOLUTE BLOODFLOW 07/29/2022  Narrative   The study is normal. The study is low risk.   Rest left ventricular function is normal. Rest EF: 58 %. Stress left ventricular function is normal. Stress EF: 61 %. End diastolic cavity size is normal. End systolic cavity size is normal.   Myocardial blood flow was computed to be 0.41ml/g/min at rest and 1.57ml/g/min at stress. Global myocardial blood flow reserve was 2.07 and was normal.   Coronary calcium was absent on the attenuation correction CT images. Aortic atherosclerosis noted.   Electronically Signed  By: Riley Lam M.D.  EXAM: OVER-READ INTERPRETATION  CT CHEST  The following report is a limited chest CT over-read performed by radiologist Dr. Irma Newness [facility] on 07/29/2022. This over-read does not include interpretation of cardiac or coronary anatomy or pathology nor does it include evaluation of the PET data. The chronic PET-CT interpretation by the cardiologist is attached.  COMPARISON:  Chest radiographs 03/17/2022. Remote chest CT 10/04/2008.  FINDINGS: Mediastinum/Nodes: No enlarged lymph nodes within the visualized mediastinum.  Lungs/Pleura: No pleural effusion or pneumothorax. There is central airway thickening and mucous plugging at both lung bases associated with mild subsegmental atelectasis. No confluent airspace disease or suspicious nodularity.  Upper abdomen: No significant findings in the visualized upper abdomen.  Musculoskeletal/Chest wall: No  chest wall mass or suspicious osseous findings within the visualized chest. Mild thoracic spondylosis.  IMPRESSION: 1. No significant extracardiac findings identified. 2. Central airway thickening and mucous plugging at both lung bases with mild associated subsegmental atelectasis.   Electronically Signed By: Carey Bullocks M.D. On: 07/29/2022 08:31   ECHOCARDIOGRAM  ECHOCARDIOGRAM COMPLETE 06/25/2022  Narrative ECHOCARDIOGRAM REPORT    Patient Name:   Leah Reeves Date of Exam: 06/25/2022 Medical Rec #:  [id number]        Height:       61.5 in Accession #:    [id number]       Weight:       142.4 lb  Date of Birth:  03-12-1951        BSA:          1.645 m Patient Age:    71 years         BP:           147/63 mmHg Patient Gender: F                HR:           55 bpm. Exam Location:  Outpatient  Procedure: 2D Echo, Cardiac Doppler and Color Doppler  Indications:    atrial fibrillation  History:        Patient has prior history of Echocardiogram examinations, most recent 11/05/2017. Arrythmias:Atrial Fibrillation; Risk Factors:Dyslipidemia.  Sonographer:    Delcie Roch RDCS Referring Phys: [id number] [place] R [place]  IMPRESSIONS   1. Left ventricular ejection fraction, by estimation, is 55 to 60%. The left ventricle has normal function. The left ventricle has no regional wall motion abnormalities. Left ventricular diastolic parameters were normal. 2. Right ventricular systolic function is normal. The right ventricular size is normal. There is normal pulmonary artery systolic pressure. 3. Left atrial size was mildly dilated. 4. The mitral valve is abnormal. Mild mitral valve regurgitation. No evidence of mitral stenosis. 5. The aortic valve is tricuspid. Aortic valve regurgitation is not visualized. No aortic stenosis is present. 6. The inferior vena cava is normal in size with greater than 50% respiratory variability, suggesting right atrial pressure of 3  mmHg.  FINDINGS Left Ventricle: Left ventricular ejection fraction, by estimation, is 55 to 60%. The left ventricle has normal function. The left ventricle has no regional wall motion abnormalities. The left ventricular internal cavity size was normal in size. There is no left ventricular hypertrophy. Left ventricular diastolic parameters were normal.  Right Ventricle: The right ventricular size is normal. No increase in right ventricular wall thickness. Right ventricular systolic function is normal. There is normal pulmonary artery systolic pressure. The tricuspid regurgitant velocity is 2.55 m/s, and with an assumed right atrial pressure of 3 mmHg, the estimated right ventricular systolic pressure is 29.0 mmHg.  Left Atrium: Left atrial size was mildly dilated.  Right Atrium: Right atrial size was normal in size.  Pericardium: There is no evidence of pericardial effusion.  Mitral Valve: The mitral valve is abnormal. There is mild thickening of the mitral valve leaflet(s). Mild mitral valve regurgitation. No evidence of mitral valve stenosis.  Tricuspid Valve: The tricuspid valve is normal in structure. Tricuspid valve regurgitation is mild . No evidence of tricuspid stenosis.  Aortic Valve: The aortic valve is tricuspid. Aortic valve regurgitation is not visualized. No aortic stenosis is present.  Pulmonic Valve: The pulmonic valve was normal in structure. Pulmonic valve regurgitation is trivial. No evidence of pulmonic stenosis.  Aorta: The aortic root is normal in size and structure.  Venous: The inferior vena cava is normal in size with greater than 50% respiratory variability, suggesting right atrial pressure of 3 mmHg.  IAS/Shunts: No atrial level shunt detected by color flow Doppler.   LEFT VENTRICLE PLAX 2D LVIDd:         4.90 cm   Diastology LVIDs:         2.60 cm   LV e' medial:    6.64 cm/s LV PW:         0.80 cm   LV E/e' medial:  13.8 LV IVS:        0.70 cm   LV e'  lateral:   8.59 cm/s LVOT diam:     1.70 cm   LV E/e' lateral: 10.7 LV SV:         68 LV SV Index:   41 LVOT Area:     2.27 cm   RIGHT VENTRICLE             IVC RV Basal diam:  2.70 cm     IVC diam: 1.70 cm RV S prime:     15.30 cm/s TAPSE (M-mode): 2.8 cm  LEFT ATRIUM             Index        RIGHT ATRIUM           Index LA diam:        4.00 cm 2.43 cm/m   RA Area:     10.70 cm LA Vol (A2C):   49.2 ml 29.91 ml/m  RA Volume:   21.60 ml  13.13 ml/m LA Vol (A4C):   37.9 ml 23.04 ml/m LA Biplane Vol: 44.9 ml 27.30 ml/m AORTIC VALVE LVOT Vmax:   137.00 cm/s LVOT Vmean:  82.000 cm/s LVOT VTI:    0.300 m  AORTA Ao Root diam: 2.50 cm Ao Asc diam:  2.90 cm  MITRAL VALVE               TRICUSPID VALVE MV Area (PHT): 3.51 cm    TR Peak grad:   26.0 mmHg MV Decel Time: 216 msec    TR Vmax:        255.00 cm/s MV E velocity: 91.90 cm/s MV A velocity: 76.60 cm/s  SHUNTS MV E/A ratio:  1.20        Systemic VTI:  0.30 m Systemic Diam: 1.70 cm  Charlton Haws MD Electronically signed by Charlton Haws MD Signature Date/Time: 06/25/2022/1:13:59 PM    Final    MONITORS  LONG TERM MONITOR (3-14 DAYS) 08/05/2022  Narrative Patch Wear Time:  13 days and 21 hours  Predominant rhythm was sinus rhythm 9 SVT episodes, all less than 16 beats 1.4% supraventricular ectopy 7.3% ventricular ectopy Triggered episodes associated with sinus rhythm  Will Camnitz, MD            EKG:  As above  Recent Labs: No results found for requested labs within last 365 days.  Recent Lipid Panel    Component Value Date/Time   CHOL 170 11/09/2011 0952   HDL 43 11/09/2011 0952     Risk Assessment/Calculations:               Physical Exam:    VS:  BP 124/68   Pulse 64   Ht 5\' 4"  (1.626 m)   Wt 142 lb (64.4 kg)   LMP 05/25/2008   SpO2 99%   BMI 24.37 kg/m     Wt Readings from Last 3 Encounters:  10/05/22 142 lb (64.4 kg)  07/03/22 142 lb (64.4 kg)  06/08/22 142 lb 6.4 oz (64.6  kg)     GEN:  Well nourished, well developed in no acute distress HEENT: Normal NECK: No JVD; No carotid bruits LYMPHATICS: No lymphadenopathy CARDIAC: RRR, no murmurs, rubs, gallops RESPIRATORY:  Clear to auscultation without rales, wheezing or rhonchi  ABDOMEN: Soft, non-tender, non-distended MUSCULOSKELETAL:  No edema; No deformity  SKIN: Warm and dry NEUROLOGIC:  Alert and oriented x 3 PSYCHIATRIC:  Normal affect   ASSESSMENT:    1. PVC's (premature ventricular contractions)   2. Paroxysmal atrial fibrillation (HCC)  PLAN:    In order of problems listed above:  Paroxysmal atrial fibrillation - Overall doing well with Eliquis Toprol diltiazem.  Prefers medication control strategy.  Ablation was previously offered by Dr. Elberta Fortis.  Cardiac PET to rule out coronary artery disease was reassuring.  No coronary artery disease was present and he started flecainide 50 mg twice a day.  This will also help her PVC burden noted on monitor as above.  Could not get cardiac CT at that time because of PVCs.          Medication Adjustments/Labs and Tests Ordered: Current medicines are reviewed at length with the patient today.  Concerns regarding medicines are outlined above.  Orders Placed This Encounter  Procedures   EKG 12-Lead   No orders of the defined types were placed in this encounter.   Patient Instructions  Medication Instructions:  Your physician recommends that you continue on your current medications as directed. Please refer to the Current Medication list given to you today.  *If you need a refill on your cardiac medications before your next appointment, please call your pharmacy*   Follow-Up: At [facility], you and your health needs are our priority.  As part of our continuing mission to provide you with exceptional heart care, we have created designated Provider Care Teams.  These Care Teams include your primary Cardiologist (physician) and Advanced  Practice Providers (APPs -  Physician Assistants and Nurse Practitioners) who all work together to provide you with the care you need, when you need it.   Your next appointment:   6 month(s)  Provider:   APP   Signed, Donato Schultz, MD  10/05/2022 10:26 AM    [facility]

## 2022-10-05 NOTE — Patient Instructions (Signed)
Medication Instructions:  Your physician recommends that you continue on your current medications as directed. Please refer to the Current Medication list given to you today.  *If you need a refill on your cardiac medications before your next appointment, please call your pharmacy*   Follow-Up: At [facility], you and your health needs are our priority.  As part of our continuing mission to provide you with exceptional heart care, we have created designated Provider Care Teams.  These Care Teams include your primary Cardiologist (physician) and Advanced Practice Providers (APPs -  Physician Assistants and Nurse Practitioners) who all work together to provide you with the care you need, when you need it.   Your next appointment:   6 month(s)  Provider:   APP

## 2022-10-14 DIAGNOSIS — H43813 Vitreous degeneration, bilateral: Secondary | ICD-10-CM | POA: Diagnosis not present

## 2022-10-29 ENCOUNTER — Ambulatory Visit (HOSPITAL_COMMUNITY)
Admission: RE | Admit: 2022-10-29 | Discharge: 2022-10-29 | Disposition: A | Discharge status: Home / Self Care | Payer: Medicare Other | Source: Ambulatory Visit | Attending: Physician Assistant | Admitting: Physician Assistant

## 2022-10-29 ENCOUNTER — Encounter (HOSPITAL_COMMUNITY): Payer: Self-pay | Admitting: Physician Assistant

## 2022-10-29 VITALS — BP 130/72 | HR 65 | Ht 64.0 in | Wt 142.6 lb

## 2022-10-29 DIAGNOSIS — I493 Ventricular premature depolarization: Secondary | ICD-10-CM | POA: Insufficient documentation

## 2022-10-29 DIAGNOSIS — E785 Hyperlipidemia, unspecified: Secondary | ICD-10-CM | POA: Insufficient documentation

## 2022-10-29 DIAGNOSIS — D6869 Other thrombophilia: Secondary | ICD-10-CM | POA: Diagnosis not present

## 2022-10-29 DIAGNOSIS — Z7901 Long term (current) use of anticoagulants: Secondary | ICD-10-CM | POA: Diagnosis not present

## 2022-10-29 DIAGNOSIS — I480 Paroxysmal atrial fibrillation: Secondary | ICD-10-CM

## 2022-10-29 DIAGNOSIS — Z79899 Other long term (current) drug therapy: Secondary | ICD-10-CM | POA: Insufficient documentation

## 2022-10-29 DIAGNOSIS — Z5181 Encounter for therapeutic drug level monitoring: Secondary | ICD-10-CM

## 2022-10-29 DIAGNOSIS — Z8249 Family history of ischemic heart disease and other diseases of the circulatory system: Secondary | ICD-10-CM | POA: Diagnosis not present

## 2022-10-29 DIAGNOSIS — Z87891 Personal history of nicotine dependence: Secondary | ICD-10-CM | POA: Insufficient documentation

## 2022-10-29 NOTE — Progress Notes (Signed)
Primary Care Physician: Lorenda Ishihara, MD Primary Cardiologist: Dr Anne Fu Primary Electrophysiologist: Dr Elberta Fortis Referring Physician: Dr Dallie Dad is a 72 y.o. female with a history of HLD, aortic atherosclerosis, and atrial fibrillation who presents for follow up in the [facility]. She presented to the emergency room on 10/26/2017 with atrial fibrillation and rapid rates. She had an episode the day prior that following a panic attack when she got bad news. Patient is on Eliquis for a CHADS2VASC score of 2.   She had done very well with no afib until 04/25/22 when she woke with fatigue and palpitations. She checked her smart watch which showed afib with heart rates 100-120 bpm. She took PRN diltiazem and the episode resolved in about 4 hours.   She had a cardiac PET scan 07/29/22 which was low risk for ischemia. She was started on flecainide. A cardiac monitor was placed 07/2022 which showed 7.3% PVC burden.   On follow up today, patient reports that she has not had any symptoms of afib since starting flecainide. Her Apple Watch has shown only SR. No bleeding issues on anticoagulation.    Today, she denies symptoms of palpitations, chest pain, shortness of breath, orthopnea, PND, lower extremity edema, dizziness, presyncope, syncope, snoring, daytime somnolence, bleeding, or neurologic sequela. The patient is tolerating medications without difficulties and is otherwise without complaint today.    Atrial Fibrillation Risk Factors:  she does not have symptoms or diagnosis of sleep apnea. she does not have a history of rheumatic fever. she does not have a history of alcohol use.   she has a BMI of Body mass index is 24.48 kg/m.Marland Kitchen Filed Weights   10/29/22 1049  Weight: 64.7 kg   Family History  Problem Relation Age of Onset   Diabetes Mother    Hypertension Mother    Heart disease Father    Diabetes Sister    Heart disease Sister       Atrial Fibrillation Management history:  Previous antiarrhythmic drugs: flecainide  Previous cardioversions: none Previous ablations: none Anticoagulation history: Eliquis   Past Medical History:  Diagnosis Date   Anxiety    Arthritis    knees -generalized   Asthma    Bronchitis    Depression    Headache    past history - none at present   Heart murmur    Osteopenia    Perimenopausal vasomotor symptoms    Vertigo    tx. 6 months ago- no problems now,very mild occ.   Past Surgical History:  Procedure Laterality Date   BREAST CYST ASPIRATION Right 2006   COLONOSCOPY WITH PROPOFOL N/A 09/18/2014   Procedure: COLONOSCOPY WITH PROPOFOL;  Surgeon: Charolett Bumpers, MD;  Location: [facility];  Service: Endoscopy;  Laterality: N/A;   NASAL SINUS SURGERY      Current Outpatient Medications  Medication Sig Dispense Refill   ALPRAZolam (XANAX) 0.25 MG tablet Take 0.25 mg by mouth 2 (two) times daily as needed for anxiety.     apixaban (ELIQUIS) 5 MG TABS tablet Take 1 tablet (5 mg total) by mouth 2 (two) times daily. 200 tablet 3   Ascorbic Acid (VITAMIN C) 500 MG CHEW 500 mg daily.     buPROPion (WELLBUTRIN XL) 150 MG 24 hr tablet Take 150 mg by mouth every morning.     cholecalciferol (VITAMIN D) 1000 units tablet Take 1,000 Units by mouth daily.     diltiazem (CARDIZEM) 30 MG  tablet Take 1 tablet (30 mg total) by mouth 4 (four) times daily as needed. 30 tablet 1   flecainide (TAMBOCOR) 50 MG tablet Take 1 tablet (50 mg total) by mouth 2 (two) times daily. 60 tablet 3   FLUoxetine (PROZAC) 20 MG capsule Take 40 mg by mouth every morning.     metoprolol succinate (TOPROL-XL) 25 MG 24 hr tablet TAKE 1 TABLET (25 MG TOTAL) BY MOUTH DAILY. 90 tablet 1   nitroGLYCERIN (NITROSTAT) 0.4 MG SL tablet Place 0.4 mg under the tongue every 5 (five) minutes as needed for chest pain.     simvastatin (ZOCOR) 40 MG tablet Take 40 mg by mouth every morning.     No current  facility-administered medications for this encounter.    Allergies  Allergen Reactions   Codeine Nausea And Vomiting   Azithromycin Rash    Social History   Socioeconomic History   Marital status: Married    Spouse name: Not on file   Number of children: Not on file   Years of education: Not on file   Highest education level: Not on file  Occupational History   Not on file  Tobacco Use   Smoking status: Former    Packs/day: 0.50    Years: 3.00    Additional pack years: 0.00    Total pack years: 1.50    Types: Cigarettes    Quit date: 05/26/1991    Years since quitting: 31.4    Passive exposure: Past   Smokeless tobacco: Never   Tobacco comments:    Former smoker 04/29/22  Vaping Use   Vaping Use: Never used  Substance and Sexual Activity   Alcohol use: Not Currently   Drug use: No   Sexual activity: Yes    Partners: Male    Birth control/protection: Post-menopausal    Comment: 1st intercourse- 23, partners- 3, married- 27 yrs   Other Topics Concern   Not on file  Social History Narrative   Not on file   Social Determinants of Health   Financial Resource Strain: Not on file  Food Insecurity: Not on file  Transportation Needs: Not on file  Physical Activity: Not on file  Stress: Not on file  Social Connections: Not on file  Intimate Partner Violence: Not on file     ROS- All systems are reviewed and negative except as per the HPI above.  Physical Exam: Vitals:   10/29/22 1049  BP: 130/72  Pulse: 65  Weight: 64.7 kg  Height: 5\' 4"  (1.626 m)     GEN- The patient is a well appearing female, alert and oriented x 3 today.   HEENT-head normocephalic, atraumatic, sclera clear, conjunctiva pink, hearing intact, trachea midline. Lungs- Clear to ausculation bilaterally, normal work of breathing Heart- Regular rate and rhythm, no murmurs, rubs or gallops  GI- soft, NT, ND, + BS Extremities- no clubbing, cyanosis, or edema MS- no significant deformity or  atrophy Skin- no rash or lesion Psych- euthymic mood, full affect Neuro- strength and sensation are intact   Wt Readings from Last 3 Encounters:  10/29/22 64.7 kg  10/05/22 64.4 kg  07/03/22 64.4 kg    EKG today demonstrates  SR, PVCs Vent. rate 65 BPM PR interval 194 ms QRS duration 84 ms QT/QTcB 422/438 ms  Echo 11/05/17 demonstrated  - Left ventricle: The cavity size was normal. Wall thickness was    normal. Systolic function was normal. The estimated ejection    fraction was in the range of  60% to 65%. Wall motion was normal;    there were no regional wall motion abnormalities. Features are    consistent with a pseudonormal left ventricular filling pattern,    with concomitant abnormal relaxation and increased filling    pressure (grade 2 diastolic dysfunction). GLS -22.6% (normal).  - Aortic valve: There was no stenosis.  - Mitral valve: There was trivial regurgitation.  - Left atrium: The atrium was mildly dilated.  - Right ventricle: The cavity size was normal. Systolic function    was normal.  - Tricuspid valve: Peak RV-RA gradient (S): 29 mm Hg.  - Pulmonary arteries: PA peak pressure: 32 mm Hg (S).  - Inferior vena cava: The vessel was normal in size. The    respirophasic diameter changes were in the normal range (>= 50%),    consistent with normal central venous pressure.  - Pericardium, extracardiac: A trivial pericardial effusion was    identified.   Impressions:   - Normal LV size with EF 60-65%. Moderate diastolic dysfunction.    Normal RV size and systolic function. No significant valvular    abnormalities.   Epic records are reviewed at length today  CHA2DS2-VASc Score = 3  The patient's score is based upon: CHF History: 0 HTN History: 0 Diabetes History: 0 Stroke History: 0 Vascular Disease History: 1 (aortic atherosclerosis) Age Score: 1 Gender Score: 1       ASSESSMENT AND PLAN: 1. Paroxysmal Atrial Fibrillation (ICD10:  I48.0) The  patient's CHA2DS2-VASc score is 3, indicating a 3.2% annual risk of stroke.   Patient appears to be maintaining SR.  Continue flecainide 50 mg BID. Up titrate cautiously as she is on Wellbutrin which can increase flecainide levels.  Continue Toprol 25 mg daily Continue Eliquis 5 mg BID Continue diltiazem 30 mg PRN q 4 hours Apple Watch for home monitoring.   2. Secondary Hypercoagulable State (ICD10:  D68.69) The patient is at significant risk for stroke/thromboembolism based upon her CHA2DS2-VASc Score of 3.  Continue Apixaban (Eliquis).   3. PVCs 7.3% on monitor Continue flecainide and BB   Follow up with Dr Anne Fu or APP per recall. AF clinic in one year.    Jorja Loa PA-C [facility] [address] [place], [place] [address] [phone] 10/29/2022 11:06 AM

## 2022-11-01 ENCOUNTER — Other Ambulatory Visit: Payer: Self-pay | Admitting: Cardiology

## 2022-12-02 ENCOUNTER — Encounter: Payer: Self-pay | Admitting: Obstetrics & Gynecology

## 2022-12-02 ENCOUNTER — Other Ambulatory Visit (HOSPITAL_COMMUNITY)
Admission: RE | Admit: 2022-12-02 | Discharge: 2022-12-02 | Disposition: A | Discharge status: Home / Self Care | Payer: Medicare Other | Source: Ambulatory Visit | Attending: Obstetrics & Gynecology | Admitting: Obstetrics & Gynecology

## 2022-12-02 ENCOUNTER — Ambulatory Visit (INDEPENDENT_AMBULATORY_CARE_PROVIDER_SITE_OTHER): Payer: Medicare Other | Admitting: Obstetrics & Gynecology

## 2022-12-02 VITALS — BP 124/82 | HR 46 | Ht 61.5 in | Wt 143.0 lb

## 2022-12-02 DIAGNOSIS — N904 Leukoplakia of vulva: Secondary | ICD-10-CM

## 2022-12-02 DIAGNOSIS — Z01419 Encounter for gynecological examination (general) (routine) without abnormal findings: Secondary | ICD-10-CM | POA: Diagnosis present

## 2022-12-02 DIAGNOSIS — M8589 Other specified disorders of bone density and structure, multiple sites: Secondary | ICD-10-CM

## 2022-12-02 DIAGNOSIS — Z780 Asymptomatic menopausal state: Secondary | ICD-10-CM

## 2022-12-02 MED ORDER — CLOBETASOL PROPIONATE 0.05 % EX OINT: 1.0000 | TOPICAL_OINTMENT | CUTANEOUS | 4 refills | Status: AC

## 2022-12-02 NOTE — Progress Notes (Signed)
Leah Reeves [birth year] [id number]   History:    72 y.o. G0 Married   RP:  Established patient presenting for annual gyn exam   HPI: Postmenopause, well on no hormone replacement therapy.  No postmenopausal bleeding.  Dryness with intercourse.  Better with new lubricant.  No H/O abnormal Pap.  Pap Neg in 10/2018.  Pap reflex today. Urine and bowel movements normal.  Breast normal. Mammo Lt Neg 02/2021, Rt Dx Mammo/US 03/2021.  Will schedule Mammo now.  Body mass index 26.58.  Keeping in good fitness, walking the dog. Healthy nutrition.  Health labs with family physician.  BD 12/2021 Osteopenia Rt Fem Neck -2.4. Continue Ca++ in diet and Vit D supplement.  Colonoscopy 2016.    Past medical history,surgical history, family history and social history were all reviewed and documented in the EPIC chart.  Gynecologic History Patient's last menstrual period was 05/25/2008.  Obstetric History OB History  Gravida Para Term Preterm AB Living  0 0 0 0 0 0  SAB IAB Ectopic Multiple Live Births  0 0 0 0 0     ROS: A ROS was performed and pertinent positives and negatives are included in the history. GENERAL: No fevers or chills. HEENT: No change in vision, no earache, sore throat or sinus congestion. NECK: No pain or stiffness. CARDIOVASCULAR: No chest pain or pressure. No palpitations. PULMONARY: No shortness of breath, cough or wheeze. GASTROINTESTINAL: No abdominal pain, nausea, vomiting or diarrhea, melena or bright red blood per rectum. GENITOURINARY: No urinary frequency, urgency, hesitancy or dysuria. MUSCULOSKELETAL: No joint or muscle pain, no back pain, no recent trauma. DERMATOLOGIC: No rash, no itching, no lesions. ENDOCRINE: No polyuria, polydipsia, no heat or cold intolerance. No recent change in weight. HEMATOLOGICAL: No anemia or easy bruising or bleeding. NEUROLOGIC: No headache, seizures, numbness, tingling or weakness. PSYCHIATRIC: No depression, no loss of interest in normal  activity or change in sleep pattern.     Exam:   BP 124/82 (BP Location: Right Arm, Patient Position: Sitting, Cuff Size: Normal)   Pulse (!) 46   Ht 5' 1.5" (1.562 m)   Wt 143 lb (64.9 kg)   LMP 05/25/2008   SpO2 98%   BMI 26.58 kg/m   Body mass index is 26.58 kg/m.  General appearance : Well developed well nourished female. No acute distress HEENT: Eyes: no retinal hemorrhage or exudates,  Neck supple, trachea midline, no carotid bruits, no thyroidmegaly Lungs: Clear to auscultation, no rhonchi or wheezes, or rib retractions  Heart: Regular rate and rhythm, no murmurs or gallops Breast:Examined in sitting and supine position were symmetrical in appearance, no palpable masses or tenderness,  no skin retraction, no nipple inversion, no nipple discharge, no skin discoloration, no axillary or supraclavicular lymphadenopathy Abdomen: no palpable masses or tenderness, no rebound or guarding Extremities: no edema or skin discoloration or tenderness  Pelvic: Vulva: White atrophy with disappearance of labia minora c/w Lichen sclerosus.             Vagina: No gross lesions or discharge  Cervix: No gross lesions or discharge.  Pap reflex done.  Uterus  AV, normal size, shape and consistency, non-tender and mobile  Adnexa  Without masses or tenderness  Anus: Normal   Assessment/Plan:  72 y.o. female for annual exam   1. Encounter for routine gynecological examination with Papanicolaou smear of cervix Postmenopause, well on no hormone replacement therapy.  No postmenopausal bleeding.  Dryness with intercourse.  Better with new lubricant.  No H/O abnormal Pap.  Pap Neg in 10/2018.  Pap reflex today. Urine and bowel movements normal.  Breast normal. Mammo Lt Neg 02/2021, Rt Dx Mammo/US 03/2021.  Will schedule Mammo now.  Body mass index 26.58.  Keeping in good fitness, walking the dog. Healthy nutrition.  Health labs with family physician.  BD 12/2021 Osteopenia Rt Fem Neck -2.4. Continue Ca++  in diet and Vit D supplement.  Colonoscopy 2016. - Cytology - PAP( [place])  2. Postmenopause Postmenopause, well on no hormone replacement therapy.  No postmenopausal bleeding.  Dryness with intercourse.  Better with new lubricant.  3. Osteopenia of multiple sites Keeping in good fitness, walking the dog. Healthy nutrition.  BD 12/2021 Osteopenia Rt Fem Neck -2.4. Continue Ca++ in diet and Vit D supplement.  Repeat BD at 2 years.  4. Lichen sclerosus et atrophicus of the vulva Vulva: White atrophy with disappearance of labia minora c/w Lichen sclerosus.  No vulvar itching.  Counseling on Lichen Sclerosus and importance of treatment discussed.  Will control with Clobetasol 0.05% ointment on affected vulva twice a week.  Other orders - clobetasol ointment (TEMOVATE) 0.05 %; Apply 1 Application topically 2 (two) times a week. Thin application on affected vulva.   Genia Del MD, 8:40 AM

## 2022-12-03 LAB — CYTOLOGY - PAP: Diagnosis: NEGATIVE

## 2023-02-09 DIAGNOSIS — I480 Paroxysmal atrial fibrillation: Secondary | ICD-10-CM | POA: Diagnosis not present

## 2023-02-09 DIAGNOSIS — J449 Chronic obstructive pulmonary disease, unspecified: Secondary | ICD-10-CM | POA: Diagnosis not present

## 2023-02-09 DIAGNOSIS — Z1231 Encounter for screening mammogram for malignant neoplasm of breast: Secondary | ICD-10-CM | POA: Diagnosis not present

## 2023-02-09 DIAGNOSIS — Z23 Encounter for immunization: Secondary | ICD-10-CM | POA: Diagnosis not present

## 2023-02-09 DIAGNOSIS — Z0000 Encounter for general adult medical examination without abnormal findings: Secondary | ICD-10-CM | POA: Diagnosis not present

## 2023-02-09 DIAGNOSIS — E785 Hyperlipidemia, unspecified: Secondary | ICD-10-CM | POA: Diagnosis not present

## 2023-02-09 DIAGNOSIS — J301 Allergic rhinitis due to pollen: Secondary | ICD-10-CM | POA: Diagnosis not present

## 2023-02-09 DIAGNOSIS — Z1211 Encounter for screening for malignant neoplasm of colon: Secondary | ICD-10-CM | POA: Diagnosis not present

## 2023-02-09 DIAGNOSIS — D329 Benign neoplasm of meninges, unspecified: Secondary | ICD-10-CM | POA: Diagnosis not present

## 2023-03-22 ENCOUNTER — Other Ambulatory Visit: Payer: Self-pay | Admitting: Cardiology

## 2023-03-22 DIAGNOSIS — I480 Paroxysmal atrial fibrillation: Secondary | ICD-10-CM

## 2023-03-22 NOTE — Telephone Encounter (Signed)
Eliquis 5mg  refill request received. Patient is 72 years old, weight-64.9kg, Crea-0.83 on 02/09/23 via Care Everywhere from [place] PCP, Diagnosis-Afib, and last seen by Alphonzo Severance on 10/29/22 & has a follow up with Jari Favre 04/05/23. Dose is appropriate based on dosing criteria. Will send in refill to requested pharmacy.

## 2023-03-22 NOTE — Telephone Encounter (Signed)
Eliquis refill. Thank you!

## 2023-04-04 NOTE — Progress Notes (Unsigned)
Cardiology Office Note:  .   Date:  04/05/2023  ID:  Leah Reeves, DOB 1950-06-27, MRN [id number] PCP: Lorenda Ishihara, MD  [facility] Providers Cardiologist:  Donato Schultz, MD {  History of Present Illness: .   Leah Reeves is a 72 y.o. female with a past medical history of atrial fibrillation, hyperlipidemia, and palpitations here for follow-up appointment.  Was in the ER for atrial fibrillation with RVR in 2019.  Back in December 2023 as she felt palpitations and was noted to be in atrial fibrillation.  A-fib was confirmed by echocardiogram.  She does feel some weakness and fatigue when she is in it.  Chest pain usually in the mornings.  Nonsterile exertional.  PET scan cardiac was performed and was low risk, no ischemia.  She was started on flecainide to help suppress her atrial fibrillation.  EKG on 10/05/2022 showed sinus rhythm, 64 bpm, QRS duration 92 ms.   She had an episode 07/2022 which was recorded on her watch.  She was last seen 10/05/2022 was having more vivid dreams with metoprolol and flecainide.  Doing well otherwise.  No chest pain or shortness of breath.  Today, she presents with a history of atrial fibrillation (AFib) and high cholesterol, with vivid dreams, which they believe are side effects of their current medications, metoprolol and flecainide. She describes the dreams as stressful and exhausting, involving impossible tasks and a constant sense of urgency. Despite these vivid dreams, the patient reports no chest pain or shortness of breath.  In addition to the vivid dreams, the patient reports an episode of waking up due to an odd feeling, which was confirmed as AFib by her watch. Since May, she has had about ten episodes of AFib, with the longest lasting about 48 hours. She takes diltiazem as needed, which seems to convert the AFib quickly.  The patient also reports a chronic issue with mucus production, leading to heavy coughing spells. Despite  seeing multiple specialists, including a pulmonologist and an allergist, and even undergoing surgery, the issue persists. The patient describes the coughing spells as exhausting and is concerned about the potential impact on her throat.  Reports no shortness of breath nor dyspnea on exertion. Reports no chest pain, pressure, or tightness. No edema, orthopnea, PND.   ROS: Pertinent ROS in HPI  Studies Reviewed: Marland Kitchen       Zio monitor 07/2022 Patch Wear Time:  13 days and 21 hours    Predominant rhythm was sinus rhythm 9 SVT episodes, all less than 16 beats 1.4% supraventricular ectopy 7.3% ventricular ectopy Triggered episodes associated with sinus rhythm   Will Camnitz, MD Risk Assessment/Calculations:    CHA2DS2-VASc Score = 3   This indicates a 3.2% annual risk of stroke. The patient's score is based upon: CHF History: 0 HTN History: 0 Diabetes History: 0 Stroke History: 0 Vascular Disease History: 1 (aortic atherosclerosis) Age Score: 1 Gender Score: 1         Physical Exam:   VS:  BP 122/62   Pulse (!) 59   Ht 5' 1.5" (1.562 m)   Wt 149 lb 9.6 oz (67.9 kg)   LMP 05/25/2008   SpO2 96%   BMI 27.81 kg/m    Wt Readings from Last 3 Encounters:  04/05/23 149 lb 9.6 oz (67.9 kg)  12/02/22 143 lb (64.9 kg)  10/29/22 142 lb 9.6 oz (64.7 kg)    GEN: Well nourished, well developed in no acute distress NECK: No JVD; No  carotid bruits CARDIAC: RRR, no murmurs, rubs, gallops RESPIRATORY: Bilateral rhonchi in both lower lobes ABDOMEN: Soft, non-tender, non-distended EXTREMITIES:  No edema; No deformity   ASSESSMENT AND PLAN: .   PVCs -Asymptomatic at this time -continue current medications  Atrial Fibrillation Patient reports approximately 10 episodes since May, with the longest lasting 48 hours. Patient uses diltiazem as needed for symptom control. Heart rate is well controlled, usually in the 80s to 90s during episodes. -Continue current management with diltiazem as  needed and Eliquis for stroke prevention. -If frequency of episodes increases or diltiazem becomes less effective, consider referral back to AFib clinic for possible increase in flecainide.  Vivid Dreams Patient reports vivid, stressful dreams while on metoprolol and flecainide. Heart rate is on the slower end (59 bpm). -Reduce metoprolol to 12.5mg  daily to see if this alleviates the vivid dreams.  Hyperlipidemia LDL is 93, total cholesterol is 177, and patient is currently on simvastatin. -Continue simvastatin as it is effectively managing cholesterol levels.  Excessive Mucus Production Patient reports significant mucus production and coughing, which has been ongoing for approximately 4 years. Patient has seen a pulmonologist and allergist, and has tried various medications with limited success. -Trial of Mucinex (guaifenesin) up to 1200mg  daily, taken with a full glass of water, to help liquefy mucus and make it easier to cough up. -If Mucinex is ineffective, consider trying Mucomyst as an inhaled mucolytic, pending discussion with pharmacy staff regarding potential tachycardia and anxiety side effects.       Dispo: She can follow-up in 6 months with Dr. Anne Fu.  Signed, Sharlene Dory, PA-C

## 2023-04-05 ENCOUNTER — Ambulatory Visit: Payer: Medicare Other | Attending: Physician Assistant | Admitting: Physician Assistant

## 2023-04-05 ENCOUNTER — Encounter: Payer: Self-pay | Admitting: Physician Assistant

## 2023-04-05 VITALS — BP 122/62 | HR 59 | Ht 61.5 in | Wt 149.6 lb

## 2023-04-05 DIAGNOSIS — E785 Hyperlipidemia, unspecified: Secondary | ICD-10-CM

## 2023-04-05 DIAGNOSIS — I493 Ventricular premature depolarization: Secondary | ICD-10-CM | POA: Diagnosis not present

## 2023-04-05 DIAGNOSIS — I480 Paroxysmal atrial fibrillation: Secondary | ICD-10-CM | POA: Diagnosis not present

## 2023-04-05 MED ORDER — METOPROLOL SUCCINATE ER 25 MG PO TB24: 12.5000 mg | ORAL_TABLET | Freq: Every day | ORAL | 3 refills | Status: DC

## 2023-04-05 NOTE — Patient Instructions (Signed)
Medication Instructions:  DECREASE Metoprolol Succinate to 12.5mg  daily (half tablet) Recommend OTC Mucinex (blue box/plain) *If you need a refill on your cardiac medications before your next appointment, please call your pharmacy*  Follow-Up: At [facility], you and your health needs are our priority.  As part of our continuing mission to provide you with exceptional heart care, we have created designated Provider Care Teams.  These Care Teams include your primary Cardiologist (physician) and Advanced Practice Providers (APPs -  Physician Assistants and Nurse Practitioners) who all work together to provide you with the care you need, when you need it.  Your next appointment:   6 month(s)  Provider:   Donato Schultz, MD

## 2023-04-21 DIAGNOSIS — D329 Benign neoplasm of meninges, unspecified: Secondary | ICD-10-CM | POA: Diagnosis not present

## 2023-04-21 DIAGNOSIS — I480 Paroxysmal atrial fibrillation: Secondary | ICD-10-CM | POA: Diagnosis not present

## 2023-04-21 DIAGNOSIS — D6869 Other thrombophilia: Secondary | ICD-10-CM | POA: Diagnosis not present

## 2023-06-03 ENCOUNTER — Telehealth (HOSPITAL_COMMUNITY): Payer: Self-pay

## 2023-06-03 DIAGNOSIS — I480 Paroxysmal atrial fibrillation: Secondary | ICD-10-CM | POA: Diagnosis not present

## 2023-06-03 DIAGNOSIS — J449 Chronic obstructive pulmonary disease, unspecified: Secondary | ICD-10-CM | POA: Diagnosis not present

## 2023-06-03 DIAGNOSIS — D329 Benign neoplasm of meninges, unspecified: Secondary | ICD-10-CM | POA: Diagnosis not present

## 2023-06-03 MED ORDER — DILTIAZEM HCL ER COATED BEADS 120 MG PO CP24: 120.0000 mg | ORAL_CAPSULE | Freq: Every day | ORAL | 3 refills | Status: DC

## 2023-06-03 MED ORDER — ATORVASTATIN CALCIUM 20 MG PO TABS: 20.0000 mg | ORAL_TABLET | Freq: Every day | ORAL | 1 refills | Status: DC

## 2023-06-03 NOTE — Telephone Encounter (Signed)
 Patient continuing to have vivid dreams related to metoprolol . Reduction in dose helped slightly but still having. Discussed with Daril Kicks PA will stop metoprolol  and start cardizem  120mg  once a day. Switch zocor  to lipitor 20mg  daily due to interaction. Pt will call if issues.

## 2023-06-16 MED ORDER — SIMVASTATIN 40 MG PO TABS: 40.0000 mg | ORAL_TABLET | Freq: Every morning | ORAL | 1 refills | Status: AC

## 2023-06-16 MED ORDER — METOPROLOL SUCCINATE ER 25 MG PO TB24: 12.5000 mg | ORAL_TABLET | Freq: Every day | ORAL | 3 refills | Status: DC

## 2023-06-16 NOTE — Addendum Note (Signed)
Addended by: Shona Simpson on: 06/16/2023 11:29 AM   Modules accepted: Orders

## 2023-06-16 NOTE — Telephone Encounter (Signed)
Patient states she is very jittery on cardizem/lipitor and would prefer to "deal with the dreams on metoprolol".  Patient is switching back to metoprolol 12.5mg  daily and simivastin 40mg  daily. Medication list updated.

## 2023-06-21 ENCOUNTER — Telehealth: Payer: Self-pay | Admitting: Cardiology

## 2023-06-21 NOTE — Telephone Encounter (Signed)
Patient is scheduled for appointment on Feb 3rd @ 10:00am with Clint Fenton-PA.

## 2023-06-21 NOTE — Telephone Encounter (Signed)
  Pt c/o medication issue:  1. Name of Medication: metoprolol succinate (TOPROL XL) 25 MG 24 hr tablet   2. How are you currently taking this medication (dosage and times per day)? As directed  3. Are you having a reaction (difficulty breathing--STAT)? No  4. What is your medication issue?  Started back taking Metoprolol on 06/16/23 and she is having panic attacks and jitters and thinks it is coming from the medication. Would like to discuss alternatives

## 2023-06-24 DIAGNOSIS — I480 Paroxysmal atrial fibrillation: Secondary | ICD-10-CM | POA: Diagnosis not present

## 2023-06-25 ENCOUNTER — Emergency Department (HOSPITAL_BASED_OUTPATIENT_CLINIC_OR_DEPARTMENT_OTHER): Payer: Medicare Other

## 2023-06-25 ENCOUNTER — Emergency Department (HOSPITAL_BASED_OUTPATIENT_CLINIC_OR_DEPARTMENT_OTHER)
Admission: EM | Admit: 2023-06-25 | Discharge: 2023-06-25 | Disposition: A | Discharge status: Home / Self Care | Payer: Medicare Other

## 2023-06-25 ENCOUNTER — Encounter (HOSPITAL_BASED_OUTPATIENT_CLINIC_OR_DEPARTMENT_OTHER): Payer: Self-pay

## 2023-06-25 ENCOUNTER — Other Ambulatory Visit: Payer: Self-pay

## 2023-06-25 DIAGNOSIS — W11XXXA Fall on and from ladder, initial encounter: Secondary | ICD-10-CM | POA: Insufficient documentation

## 2023-06-25 DIAGNOSIS — J3489 Other specified disorders of nose and nasal sinuses: Secondary | ICD-10-CM | POA: Insufficient documentation

## 2023-06-25 DIAGNOSIS — Z7901 Long term (current) use of anticoagulants: Secondary | ICD-10-CM | POA: Insufficient documentation

## 2023-06-25 DIAGNOSIS — M50222 Other cervical disc displacement at C5-C6 level: Secondary | ICD-10-CM | POA: Diagnosis not present

## 2023-06-25 DIAGNOSIS — D329 Benign neoplasm of meninges, unspecified: Secondary | ICD-10-CM | POA: Diagnosis not present

## 2023-06-25 DIAGNOSIS — S199XXA Unspecified injury of neck, initial encounter: Secondary | ICD-10-CM | POA: Diagnosis not present

## 2023-06-25 DIAGNOSIS — W19XXXA Unspecified fall, initial encounter: Secondary | ICD-10-CM

## 2023-06-25 DIAGNOSIS — S0001XA Abrasion of scalp, initial encounter: Secondary | ICD-10-CM | POA: Diagnosis not present

## 2023-06-25 DIAGNOSIS — M47812 Spondylosis without myelopathy or radiculopathy, cervical region: Secondary | ICD-10-CM | POA: Diagnosis not present

## 2023-06-25 DIAGNOSIS — M4802 Spinal stenosis, cervical region: Secondary | ICD-10-CM | POA: Diagnosis not present

## 2023-06-25 DIAGNOSIS — S0990XA Unspecified injury of head, initial encounter: Secondary | ICD-10-CM | POA: Diagnosis not present

## 2023-06-25 DIAGNOSIS — R220 Localized swelling, mass and lump, head: Secondary | ICD-10-CM | POA: Diagnosis not present

## 2023-06-25 DIAGNOSIS — D320 Benign neoplasm of cerebral meninges: Secondary | ICD-10-CM | POA: Diagnosis not present

## 2023-06-25 HISTORY — DX: Unspecified atrial fibrillation: I48.91

## 2023-06-25 NOTE — ED Triage Notes (Signed)
Fell from step ladder  third step fell backwards onto bottom first and then backwards hitting head.  Denies LOC on blood thinners.  Pain to back base of skull.

## 2023-06-25 NOTE — ED Provider Notes (Signed)
 [place] EMERGENCY DEPARTMENT AT [facility] Provider Note   CSN: [id number] Arrival date & time: 06/25/23  1412     History Chief Complaint  Patient presents with   Leah Reeves    Leah Reeves is a 73 y.o. female.  Patient presents to the emergency department concerns of a mechanical fall.  Patient is on blood thinners.  She reports that she fell from the third step of the ladder and landed on her bottom when then she fell backward and hit the back of her head.  Has some pain in this area but denies any syncope, dizziness, or severe nausea or vomiting.  States that she did take her blood thinner this morning.   Fall       Home Medications Prior to Admission medications   Medication Sig Start Date End Date Taking? Authorizing Provider  ALPRAZolam (XANAX) 0.25 MG tablet Take 0.25 mg by mouth 2 (two) times daily as needed for anxiety.    [provider]  apixaban (ELIQUIS) 5 MG TABS tablet TAKE 1 TABLET BY MOUTH TWICE  DAILY 03/22/23   Jake Bathe, MD  Ascorbic Acid (VITAMIN C) 500 MG CHEW 500 mg daily.    [provider]  buPROPion (WELLBUTRIN XL) 150 MG 24 hr tablet Take 150 mg by mouth every morning.    [provider]  cholecalciferol (VITAMIN D) 1000 units tablet Take 1,000 Units by mouth daily.    [provider]  clobetasol ointment (TEMOVATE) 0.05 % Apply 1 Application topically 2 (two) times a week. Thin application on affected vulva. 12/03/22   Genia Del, MD  diltiazem (CARDIZEM) 30 MG tablet Take 1 tablet (30 mg total) by mouth 4 (four) times daily as needed. 04/29/22   Fenton, Clint R, PA  flecainide (TAMBOCOR) 50 MG tablet TAKE 1 TABLET BY MOUTH TWICE A DAY 11/02/22   Camnitz, Andree Coss, MD  FLUoxetine (PROZAC) 20 MG capsule Take 40 mg by mouth every morning. 01/08/20   [provider]  metoprolol succinate (TOPROL XL) 25 MG 24 hr tablet Take 0.5 tablets (12.5 mg total) by mouth daily. 06/16/23   Fenton, Clint  R, PA  nitroGLYCERIN (NITROSTAT) 0.4 MG SL tablet Place 0.4 mg under the tongue every 5 (five) minutes as needed for chest pain. 06/16/22   [provider]  simvastatin (ZOCOR) 40 MG tablet Take 1 tablet (40 mg total) by mouth every morning. 06/16/23   Fenton, Clint R, PA      Allergies    Codeine and Azithromycin    Review of Systems   Review of Systems  HENT:         Head pain  All other systems reviewed and are negative.   Physical Exam Updated Vital Signs BP (!) 164/68 (BP Location: Right Arm)   Pulse (!) 53   Temp 98.8 F (37.1 C) (Oral)   Resp 18   Ht 5\' 4"  (1.626 m)   Wt 67.1 kg   LMP 05/25/2008   SpO2 98%   BMI 25.40 kg/m  Physical Exam Vitals and nursing note reviewed.  Constitutional:      General: She is not in acute distress.    Appearance: She is well-developed.  HENT:     Head: Normocephalic and atraumatic.  Eyes:     Extraocular Movements: Extraocular movements intact.     Conjunctiva/sclera: Conjunctivae normal.     Pupils: Pupils are equal, round, and reactive to light.  Cardiovascular:     Rate and  Rhythm: Normal rate and regular rhythm.     Heart sounds: No murmur heard. Pulmonary:     Effort: Pulmonary effort is normal. No respiratory distress.     Breath sounds: Normal breath sounds.  Abdominal:     Palpations: Abdomen is soft.     Tenderness: There is no abdominal tenderness.  Musculoskeletal:        General: No swelling.     Cervical back: Neck supple.  Skin:    General: Skin is warm and dry.     Capillary Refill: Capillary refill takes less than 2 seconds.          Comments: Minor abrasion to the posterior scalp with no laceration.  No visible bruising.  Neurological:     General: No focal deficit present.     Mental Status: She is alert and oriented to person, place, and time. Mental status is at baseline.     Cranial Nerves: No cranial nerve deficit.     Motor: No weakness.     Gait: Gait normal.  Psychiatric:         Mood and Affect: Mood normal.     ED Results / Procedures / Treatments   Labs (all labs ordered are listed, but only abnormal results are displayed) Labs Reviewed - No data to display  EKG None  Radiology CT Cervical Spine Wo Contrast Result Date: 06/25/2023 CLINICAL DATA:  Larey Seat from step ladder third stepped backwards onto bottom and then backwards hitting head. No loss of consciousness. EXAM: CT CERVICAL SPINE WITHOUT CONTRAST TECHNIQUE: Multidetector CT imaging of the cervical spine was performed without intravenous contrast. Multiplanar CT image reconstructions were also generated. RADIATION DOSE REDUCTION: This exam was performed according to the departmental dose-optimization program which includes automated exposure control, adjustment of the mA and/or kV according to patient size and/or use of iterative reconstruction technique. COMPARISON:  CT cervical spine 05/01/2019 FINDINGS: Alignment: There is mild kyphotic angulation centered at C4, unchanged from prior. 2 mm retrolisthesis of C5 on C6, unchanged from prior and degenerative. The atlantodens interval is intact. The facet joints are appropriately aligned. Skull base and vertebrae: Vertebral body heights are maintained. Severe C5-6 and C6-7 and moderate C4-5 disc space narrowing and large anterior C4-5 and C5-6 and moderate anterior C6-7 bridging osteophytes. Mild-to-moderate C5-6 endplate sclerosis is similar to prior. Minimal posterior C4-5 vacuum disc, unchanged No acute fracture is seen. Soft tissues and spinal canal: No prevertebral fluid or swelling. No visible canal hematoma. Disc levels: Degenerative changes including disc space narrowing, uncovertebral hypertrophy, and facet joint hypertrophy contribute to mild-to-moderate right and borderline mild left C4-5 moderate to severe left and mild-to-moderate right C5-6, severe bilateral C6-7 neural foramina stenosis. Mild C5-6 central canal narrowing, similar to prior. Upper chest: Mild  peripheral interstitial thickening within the lung apices, atelectasis versus scarring, similar to prior. Other: No cervical chain lymphadenopathy. IMPRESSION: 1. No acute fracture or traumatic listhesis of the cervical spine. 2. Multilevel degenerative changes as above, similar to prior. Electronically Signed   By: Neita Garnet M.D.   On: 06/25/2023 16:12   CT Head Wo Contrast Result Date: 06/25/2023 CLINICAL DATA:  Head trauma, minor. Fell from step ladder third step. Fell backwards onto bottom first and backwards hitting head. Denies loss of consciousness. On blood thinners. Pain to back base of skull. EXAM: CT HEAD WITHOUT CONTRAST TECHNIQUE: Contiguous axial images were obtained from the base of the skull through the vertex without intravenous contrast. RADIATION DOSE REDUCTION: This exam was  performed according to the departmental dose-optimization program which includes automated exposure control, adjustment of the mA and/or kV according to patient size and/or use of iterative reconstruction technique. COMPARISON:  CT brain 05/01/2019, MRI brain 08/02/2021 FINDINGS: Brain: The ventricles are normal in size and configuration. The basilar cisterns are patent. No significant change in small extra-axial mass within the anterior left frontal region, measuring up to approximately 10 x 5 x 13 mm (transverse by AP by craniocaudal) on the current CT, unchanged from 05/01/2019 CT. This was also seen on prior MRI and favored to be a stable meningioma. No acute intracranial hemorrhage is seen. No abnormal extra-axial fluid collection. Preservation of the normal cortical gray-white interface without CT evidence of an acute major vascular territorial cortical based infarction. Vascular: No hyperdense vessel or unexpected calcification. Skull: Normal. Negative for fracture or focal lesion. Sinuses/Orbits: The visualized orbits are unremarkable. Mild-to-moderate superior ethmoid air cell mucosal thickening. Interval  bilateral ethmoid air cell resection and bilateral maxillary sinus antrostomies compared to 05/01/2019. Mild mucosal thickening of the inferior ethmoid air cells and minimally partially visualized superior aspect of the bilateral maxillary sinuses. The visualized mastoid air cells are clear. Other: None. IMPRESSION: 1. No acute intracranial abnormality. 2. No significant change in small extra-axial meningioma within the anterior left frontal region, measuring up to approximately 10 x 5 x 13 mm. 3. Interval bilateral ethmoid air cell resection and bilateral maxillary sinus antrostomies. Mild-to-moderate superior ethmoid air cell mucosal thickening. Electronically Signed   By: Neita Garnet M.D.   On: 06/25/2023 16:01    Procedures Procedures   Medications Ordered in ED Medications - No data to display  ED Course/ Medical Decision Making/ A&P                                 Medical Decision Making Amount and/or Complexity of Data Reviewed Radiology: ordered.    This patient presents to the ED for concern of fall.  Differential diagnosis includes SAH, scalp laceration, concussion, headache   Imaging Studies ordered:  I ordered imaging studies including CT head, CT cervical spine I independently visualized and interpreted imaging which showed negative for any acute intracranial malady or cervical spine irregularity I agree with the radiologist interpretation   Problem List / ED Course:  Patient presents to the emergency department following a mechanical fall.  She is on blood thinners for atrial fibrillation.  States that she was on a ladder when she slipped backwards and landed on her bottom and then fell and hit the back of her head.  No loss of consciousness.  States that she had some mild nausea but has not any vomiting.  Nausea now resolving.  No significant headache or alteration in mentation.  Patient presented for evaluation of the fall since she knows that she is on a blood  thinner.  No prior history of head bleed. Physical exam is reassuring.  No notable hematoma but there is a slight abrasion to the posterior scalp.  No cervical spine tenderness and range of motion preserved and intact.  Denies any significant pain or nausea at this time so hold off on medications. CT imaging is thankfully reassuring of the head and cervical spine.  No acute abnormality. Informed patient of reassuring findings.  Advised patient to continue to monitor symptoms for the next 2 days as some head bleeding can be slow to progress and develop.  Advised patient return the emergency  department if she has any new or worsening symptoms such as severe headaches, nausea, intractable vomiting.  Patient is in agreement current plan verbalized understanding return precautions.  Patient discharged home in stable condition.  Final Clinical Impression(s) / ED Diagnoses Final diagnoses:  Fall, initial encounter  Abrasion of scalp, initial encounter    Rx / DC Orders ED Discharge Orders     None         Smitty Knudsen, PA-C 06/25/23 1659    Durwin Glaze, MD 06/26/23 [phone]

## 2023-06-25 NOTE — Discharge Instructions (Signed)
You were seen in the ER today for concerns of a fall. Your CT imaging was thankfully reassuring without any abnormal findings. I would continue to monitor yourself over the next 2-3 days for any development of severe headaches, nausea, and vomiting as this would require you to return to the ER for repeat evaluation.

## 2023-06-27 DIAGNOSIS — S0001XA Abrasion of scalp, initial encounter: Secondary | ICD-10-CM | POA: Diagnosis not present

## 2023-06-27 DIAGNOSIS — W19XXXA Unspecified fall, initial encounter: Secondary | ICD-10-CM | POA: Diagnosis not present

## 2023-06-28 ENCOUNTER — Encounter (HOSPITAL_COMMUNITY): Payer: Self-pay | Admitting: Physician Assistant

## 2023-06-28 ENCOUNTER — Ambulatory Visit (HOSPITAL_COMMUNITY)
Admission: RE | Admit: 2023-06-28 | Discharge: 2023-06-28 | Disposition: A | Discharge status: Home / Self Care | Payer: Medicare Other | Source: Ambulatory Visit | Attending: Physician Assistant | Admitting: Physician Assistant

## 2023-06-28 VITALS — BP 136/80 | HR 60 | Ht 64.0 in | Wt 147.6 lb

## 2023-06-28 DIAGNOSIS — Z7901 Long term (current) use of anticoagulants: Secondary | ICD-10-CM | POA: Diagnosis not present

## 2023-06-28 DIAGNOSIS — I493 Ventricular premature depolarization: Secondary | ICD-10-CM | POA: Insufficient documentation

## 2023-06-28 DIAGNOSIS — Z5181 Encounter for therapeutic drug level monitoring: Secondary | ICD-10-CM | POA: Diagnosis not present

## 2023-06-28 DIAGNOSIS — I480 Paroxysmal atrial fibrillation: Secondary | ICD-10-CM | POA: Diagnosis not present

## 2023-06-28 DIAGNOSIS — Z79899 Other long term (current) drug therapy: Secondary | ICD-10-CM | POA: Insufficient documentation

## 2023-06-28 DIAGNOSIS — D6869 Other thrombophilia: Secondary | ICD-10-CM | POA: Diagnosis not present

## 2023-06-28 NOTE — Progress Notes (Signed)
Primary Care Physician: Lorenda Ishihara, MD Primary Cardiologist: Dr Anne Fu Primary Electrophysiologist: Dr Elberta Fortis Referring Physician: Dr Dallie Dad is a 73 y.o. female with a history of HLD, aortic atherosclerosis, and atrial fibrillation who presents for follow up in the [facility]. She presented to the emergency room on 10/26/2017 with atrial fibrillation and rapid rates. She had an episode the day prior that following a panic attack when she got bad news. Patient is on Eliquis for a CHADS2VASC score of 2.   She had done very well with no afib until 04/25/22 when she woke with fatigue and palpitations. She checked her smart watch which showed afib with heart rates 100-120 bpm. She took PRN diltiazem and the episode resolved in about 4 hours.   She had a cardiac PET scan 07/29/22 which was low risk for ischemia. She was started on flecainide. A cardiac monitor was placed 07/2022 which showed 7.3% PVC burden.   Patient returns for follow up for atrial fibrillation and flecainide monitoring. Patient has had "vivid dreams" since starting metoprolol and flecainide. She was switched to diltiazem but her symptoms worsened and she resumed metoprolol. She did fall off the bottom of a ladder 1/31 and was seen at the ED. Fortunately, CT scan of her head was reassuring with no bleeding.   Today, he denies symptoms of palpitations, chest pain, orthopnea, PND, lower extremity edema, dizziness, presyncope, syncope, snoring, daytime somnolence, bleeding, or neurologic sequela. The patient is tolerating medications without difficulties and is otherwise without complaint today.    Atrial Fibrillation Risk Factors:  she does not have symptoms or diagnosis of sleep apnea. she does not have a history of rheumatic fever. she does not have a history of alcohol use.   Atrial Fibrillation Management history:  Previous antiarrhythmic drugs: flecainide   Previous cardioversions: none Previous ablations: none Anticoagulation history: Eliquis   Past Medical History:  Diagnosis Date   Anxiety    Arthritis    knees -generalized   Asthma    Atrial fibrillation (HCC)    Bronchitis    Depression    Headache    past history - none at present   Heart murmur    Osteopenia    Perimenopausal vasomotor symptoms    Vertigo    tx. 6 months ago- no problems now,very mild occ.    Current Outpatient Medications  Medication Sig Dispense Refill   ALPRAZolam (XANAX) 0.25 MG tablet Take 0.25 mg by mouth 2 (two) times daily as needed for anxiety.     apixaban (ELIQUIS) 5 MG TABS tablet TAKE 1 TABLET BY MOUTH TWICE  DAILY 200 tablet 1   Ascorbic Acid (VITAMIN C) 500 MG CHEW 500 mg daily.     buPROPion (WELLBUTRIN XL) 150 MG 24 hr tablet Take 150 mg by mouth every morning.     cholecalciferol (VITAMIN D) 1000 units tablet Take 1,000 Units by mouth daily.     clobetasol ointment (TEMOVATE) 0.05 % Apply 1 Application topically 2 (two) times a week. Thin application on affected vulva. 30 g 4   diltiazem (CARDIZEM) 30 MG tablet Take 1 tablet (30 mg total) by mouth 4 (four) times daily as needed. 30 tablet 1   flecainide (TAMBOCOR) 50 MG tablet TAKE 1 TABLET BY MOUTH TWICE A DAY 180 tablet 3   FLUoxetine (PROZAC) 20 MG capsule Take 40 mg by mouth every morning.     metoprolol succinate (TOPROL XL) 25 MG 24  hr tablet Take 0.5 tablets (12.5 mg total) by mouth daily. 45 tablet 3   nitroGLYCERIN (NITROSTAT) 0.4 MG SL tablet Place 0.4 mg under the tongue every 5 (five) minutes as needed for chest pain.     simvastatin (ZOCOR) 40 MG tablet Take 1 tablet (40 mg total) by mouth every morning. 90 tablet 1   No current facility-administered medications for this encounter.    ROS- All systems are reviewed and negative except as per the HPI above.  Physical Exam: Vitals:   06/28/23 1005  BP: 136/80  Pulse: 60  Weight: 67 kg  Height: 5\' 4"  (1.626 m)     GEN: Well nourished, well developed in no acute distress CARDIAC: Regular rate and rhythm, no murmurs, rubs, gallops RESPIRATORY:  Clear to auscultation without rales, wheezing or rhonchi  ABDOMEN: Soft, non-tender, non-distended EXTREMITIES:  No edema; No deformity    Wt Readings from Last 3 Encounters:  06/28/23 67 kg  06/25/23 67.1 kg  04/05/23 67.9 kg    EKG today demonstrates  SR Vent. rate 60 BPM PR interval 178 ms QRS duration 84 ms QT/QTcB 426/426 ms   Echo 06/25/22 demonstrated   1. Left ventricular ejection fraction, by estimation, is 55 to 60%. The  left ventricle has normal function. The left ventricle has no regional  wall motion abnormalities. Left ventricular diastolic parameters were  normal.   2. Right ventricular systolic function is normal. The right ventricular  size is normal. There is normal pulmonary artery systolic pressure.   3. Left atrial size was mildly dilated.   4. The mitral valve is abnormal. Mild mitral valve regurgitation. No  evidence of mitral stenosis.   5. The aortic valve is tricuspid. Aortic valve regurgitation is not  visualized. No aortic stenosis is present.   6. The inferior vena cava is normal in size with greater than 50%  respiratory variability, suggesting right atrial pressure of 3 mmHg.    Epic records are reviewed at length today  CHA2DS2-VASc Score = 3  The patient's score is based upon: CHF History: 0 HTN History: 0 Diabetes History: 0 Stroke History: 0 Vascular Disease History: 1 (aortic atherosclerosis) Age Score: 1 Gender Score: 1       ASSESSMENT AND PLAN: Paroxysmal Atrial Fibrillation (ICD10:  I48.0) The patient's CHA2DS2-VASc score is 3, indicating a 3.2% annual risk of stroke.   Patient in SR. We discussed alternate rhythm control options including changing metoprolol to bisoprolol, dofetilide, or ablation. Patient would like to discuss ablation with Dr Elberta Fortis in order to avoid long term  medications.   Continue flecainide 50 mg BID Continue Toprol 12.5 mg daily Continue Eliquis 5 mg BID Continue diltiazem 30 mg PRN q 4 hours for heart racing.  Apple Watch for home monitoring   Secondary Hypercoagulable State (ICD10:  [phone]) The patient is at significant risk for stroke/thromboembolism based upon her CHA2DS2-VASc Score of 3.  Continue Apixaban (Eliquis).   High Risk Medication Monitoring (ICD 10: Z79.899) PR and QRS intervals on ECG appropriate for flecainide monitoring.    PVCs 7.3% on monitor 07/2022 Continue flecainide and BB as above.    Follow up with Dr Elberta Fortis to discuss ablation.    Jorja Loa PA-C [facility] [address] [place], [place] [address] [phone] 06/28/2023 10:13 AM

## 2023-07-01 DIAGNOSIS — I480 Paroxysmal atrial fibrillation: Secondary | ICD-10-CM | POA: Diagnosis not present

## 2023-07-01 DIAGNOSIS — S0990XA Unspecified injury of head, initial encounter: Secondary | ICD-10-CM | POA: Diagnosis not present

## 2023-07-01 DIAGNOSIS — D6869 Other thrombophilia: Secondary | ICD-10-CM | POA: Diagnosis not present

## 2023-07-01 DIAGNOSIS — W19XXXA Unspecified fall, initial encounter: Secondary | ICD-10-CM | POA: Diagnosis not present

## 2023-07-06 DIAGNOSIS — I480 Paroxysmal atrial fibrillation: Secondary | ICD-10-CM | POA: Diagnosis not present

## 2023-07-09 ENCOUNTER — Telehealth: Payer: Self-pay | Admitting: Cardiology

## 2023-07-09 NOTE — Telephone Encounter (Signed)
Pt reports panic attacks and depression for about a year now. Her "anxiety is not mild, it is horrible".  But this continues to worsen. States they tried to switch her to Diltiazem last year, but she had terrible experience (nervousness/jittery)and had to go back on the Toprol. Jari Favre, PA reduced Toprol to 12.5 mg daily in November 2024.   Reports HRs at home avg 50s. Pt aware that I do not believe that Flecainide is the cause. Aware that Toprol could possibly be (depression is listed as adverse effect) the culprit.  Informed that not sure the low dose she is on would be causing, but b/c depression is listed as a SE and HRs in the 50s.  Informed that MD may be agreeable to holding Toprol to see if improvement in symptoms -- and made aware this may take several weeks to see improvement. Aware forwarding to him for advisement and would call her back before leaving today. Patient verbalized understanding and agreeable to plan.    (She reports she is seeing [place] on Monday to discuss ablation.  Told her I think holding that med through the weekend would be fine but will await MD recommendation/s)

## 2023-07-09 NOTE — Telephone Encounter (Signed)
Pt called in asking to speak with nurse about this medications. She states she needs to stop it. Please advise.    flecainide (TAMBOCOR) 50 MG tablet

## 2023-07-12 ENCOUNTER — Encounter: Payer: Self-pay | Admitting: Cardiology

## 2023-07-12 ENCOUNTER — Ambulatory Visit: Payer: Medicare Other | Attending: Cardiology | Admitting: Cardiology

## 2023-07-12 ENCOUNTER — Ambulatory Visit (INDEPENDENT_AMBULATORY_CARE_PROVIDER_SITE_OTHER): Payer: Medicare Other | Admitting: Adult Health

## 2023-07-12 ENCOUNTER — Encounter: Payer: Self-pay | Admitting: Adult Health

## 2023-07-12 VITALS — BP 114/76 | HR 74 | Ht 64.0 in | Wt 139.0 lb

## 2023-07-12 VITALS — BP 131/71 | HR 93 | Ht 64.0 in | Wt 141.0 lb

## 2023-07-12 DIAGNOSIS — F411 Generalized anxiety disorder: Secondary | ICD-10-CM | POA: Diagnosis not present

## 2023-07-12 DIAGNOSIS — Z01812 Encounter for preprocedural laboratory examination: Secondary | ICD-10-CM

## 2023-07-12 DIAGNOSIS — I480 Paroxysmal atrial fibrillation: Secondary | ICD-10-CM

## 2023-07-12 DIAGNOSIS — D6869 Other thrombophilia: Secondary | ICD-10-CM | POA: Diagnosis not present

## 2023-07-12 DIAGNOSIS — I493 Ventricular premature depolarization: Secondary | ICD-10-CM

## 2023-07-12 MED ORDER — ALPRAZOLAM 0.5 MG PO TABS: 0.5000 mg | ORAL_TABLET | Freq: Three times a day (TID) | ORAL | 2 refills | Status: DC | PRN

## 2023-07-12 NOTE — Patient Instructions (Signed)
Medication Instructions:  Your physician recommends that you continue on your current medications as directed. Please refer to the Current Medication list given to you today.  *If you need a refill on your cardiac medications before your next appointment, please call your pharmacy*   Lab Work: Pre procedure labs -- we will call you to schedule:  BMP & CBC  If you have a lab test that is abnormal and we need to change your treatment, we will call you to review the results -- otherwise no news is good news.    Testing/Procedures: Your physician has requested that you have cardiac CT 1 month PRIOR to your ablation. Cardiac computed tomography (CT) is a painless test that uses an x-ray machine to take clear, detailed pictures of your heart. We will contact you if the result is abnormal. We will call you to schedule.  Your physician has recommended that you have an ablation. Catheter ablation is a medical procedure used to treat some cardiac arrhythmias (irregular heartbeats). During catheter ablation, a long, thin, flexible tube is put into a blood vessel in your groin (upper thigh), or neck. This tube is called an ablation catheter. It is then guided to your heart through the blood vessel. Radio frequency waves destroy small areas of heart tissue where abnormal heartbeats may cause an arrhythmia to start.   Your ablation is scheduled for 09/09/2023. Please arrive at [facility] at 5:30 am.  We will call you for further instructions.   Follow-Up: At [facility], you and your health needs are our priority.  As part of our continuing mission to provide you with exceptional heart care, we have created designated Provider Care Teams.  These Care Teams include your primary Cardiologist (physician) and Advanced Practice Providers (APPs -  Physician Assistants and Nurse Practitioners) who all work together to provide you with the care you need, when you need it.  Your next appointment:   1  month(s) after your ablation  The format for your next appointment:   In Person  Provider:   [facility] clinic   Thank you for choosing [facility]!!   Dory Horn, RN [phone]    Other Instructions   Cardiac Ablation Cardiac ablation is a procedure to destroy (ablate) some heart tissue that is sending bad signals. These bad signals cause problems in heart rhythm. The heart has many areas that make these signals. If there are problems in these areas, they can make the heart beat in a way that is not normal. Destroying some tissues can help make the heart rhythm normal. Tell your doctor about: Any allergies you have. All medicines you are taking. These include vitamins, herbs, eye drops, creams, and over-the-counter medicines. Any problems you or family members have had with medicines that make you fall asleep (anesthetics). Any blood disorders you have. Any surgeries you have had. Any medical conditions you have, such as kidney failure. Whether you are pregnant or may be pregnant. What are the risks? This is a safe procedure. But problems may occur, including: Infection. Bruising and bleeding. Bleeding into the chest. Stroke or blood clots. Damage to nearby areas of your body. Allergies to medicines or dyes. The need for a pacemaker if the normal system is damaged. Failure of the procedure to treat the problem. What happens before the procedure? Medicines Ask your doctor about: Changing or stopping your normal medicines. This is important. Taking aspirin and ibuprofen. Do not take these medicines unless your doctor tells you to  take them. Taking other medicines, vitamins, herbs, and supplements. General instructions Follow instructions from your doctor about what you cannot eat or drink. Plan to have someone take you home from the hospital or clinic. If you will be going home right after the procedure, plan to have someone with you for 24 hours. Ask your doctor  what steps will be taken to prevent infection. What happens during the procedure?  An IV tube will be put into one of your veins. You will be given a medicine to help you relax. The skin on your neck or groin will be numbed. A cut (incision) will be made in your neck or groin. A needle will be put through your cut and into a large vein. A tube (catheter) will be put into the needle. The tube will be moved to your heart. Dye may be put through the tube. This helps your doctor see your heart. Small devices (electrodes) on the tube will send out signals. A type of energy will be used to destroy some heart tissue. The tube will be taken out. Pressure will be held on your cut. This helps stop bleeding. A bandage will be put over your cut. The exact procedure may vary among doctors and hospitals. What happens after the procedure? You will be watched until you leave the hospital or clinic. This includes checking your heart rate, breathing rate, oxygen, and blood pressure. Your cut will be watched for bleeding. You will need to lie still for a few hours. Do not drive for 24 hours or as long as your doctor tells you. Summary Cardiac ablation is a procedure to destroy some heart tissue. This is done to treat heart rhythm problems. Tell your doctor about any medical conditions you may have. Tell him or her about all medicines you are taking to treat them. This is a safe procedure. But problems may occur. These include infection, bruising, bleeding, and damage to nearby areas of your body. Follow what your doctor tells you about food and drink. You may also be told to change or stop some of your medicines. After the procedure, do not drive for 24 hours or as long as your doctor tells you. This information is not intended to replace advice given to you by your health care provider. Make sure you discuss any questions you have with your health care provider. Document Revised: 08/01/2021 Document  Reviewed: 04/13/2019 Elsevier Patient Education  2023 [employer] Inc[employer]   Cardiac Ablation, Care After  This sheet gives you information about how to care for yourself after your procedure. Your health care provider may also give you more specific instructions. If you have problems or questions, contact your health care provider. What can I expect after the procedure? After the procedure, it is common to have: Bruising around your puncture site. Tenderness around your puncture site. Skipped heartbeats. If you had an atrial fibrillation ablation, you may have atrial fibrillation during the first several months after your procedure.  Tiredness (fatigue).  Follow these instructions at home: Puncture site care  Follow instructions from your health care provider about how to take care of your puncture site. Make sure you: If present, leave stitches (sutures), skin glue, or adhesive strips in place. These skin closures may need to stay in place for up to 2 weeks. If adhesive strip edges start to loosen and curl up, you may trim the loose edges. Do not remove adhesive strips completely unless your health care provider tells you to do that.  If a large square bandage is present, this may be removed 24 hours after surgery.  Check your puncture site every day for signs of infection. Check for: Redness, swelling, or pain. Fluid or blood. If your puncture site starts to bleed, lie down on your back, apply firm pressure to the area, and contact your health care provider. Warmth. Pus or a bad smell. A pea or small marble sized lump at the site is normal and can take up to three months to resolve.  Driving Do not drive for at least 4 days after your procedure or however long your health care provider recommends. (Do not resume driving if you have previously been instructed not to drive for other health reasons.) Do not drive or use heavy machinery while taking prescription pain medicine. Activity Avoid  activities that take a lot of effort for at least 7 days after your procedure. Do not lift anything that is heavier than 5 lb (4.5 kg) for one week.  No sexual activity for 1 week.  Return to your normal activities as told by your health care provider. Ask your health care provider what activities are safe for you. General instructions Take over-the-counter and prescription medicines only as told by your health care provider. Do not use any products that contain nicotine or tobacco, such as cigarettes and e-cigarettes. If you need help quitting, ask your health care provider. You may shower after 24 hours, but Do not take baths, swim, or use a hot tub for 1 week.  Do not drink alcohol for 24 hours after your procedure. Keep all follow-up visits as told by your health care provider. This is important. Contact a health care provider if: You have redness, mild swelling, or pain around your puncture site. You have fluid or blood coming from your puncture site that stops after applying firm pressure to the area. Your puncture site feels warm to the touch. You have pus or a bad smell coming from your puncture site. You have a fever. You have chest pain or discomfort that spreads to your neck, jaw, or arm. You have chest pain that is worse with lying on your back or taking a deep breath. You are sweating a lot. You feel nauseous. You have a fast or irregular heartbeat. You have shortness of breath. You are dizzy or light-headed and feel the need to lie down. You have pain or numbness in the arm or leg closest to your puncture site. Get help right away if: Your puncture site suddenly swells. Your puncture site is bleeding and the bleeding does not stop after applying firm pressure to the area. These symptoms may represent a serious problem that is an emergency. Do not wait to see if the symptoms will go away. Get medical help right away. Call your local emergency services (911 in the [place] Do not  drive yourself to the hospital. Summary After the procedure, it is normal to have bruising and tenderness at the puncture site in your groin, neck, or forearm. Check your puncture site every day for signs of infection. Get help right away if your puncture site is bleeding and the bleeding does not stop after applying firm pressure to the area. This is a medical emergency. This information is not intended to replace advice given to you by your health care provider. Make sure you discuss any questions you have with your health care provider.

## 2023-07-12 NOTE — Progress Notes (Signed)
  Electrophysiology Office Note:   Date:  07/12/2023  ID:  Leah Reeves, DOB [birth year], MRN [id number]  Primary Cardiologist: Donato Schultz, MD Primary Heart Failure: None Electrophysiologist: [place] Jorja Loa, MD      History of Present Illness:   Leah Reeves is a 73 y.o. female with h/o hyperlipidemia, atrial fibrillation seen today for routine electrophysiology followup.   Since last being seen in our clinic the patient reports doing overall well.  She has noted no further episodes of atrial fibrillation.  Despite this, she has had some side effects from her flecainide with very vivid dreams.  Due to this, she is interested in alternative rhythm control.  she denies chest pain, palpitations, dyspnea, PND, orthopnea, nausea, vomiting, dizziness, syncope, edema, weight gain, or early satiety.   Review of systems complete and found to be negative unless listed in HPI.   EP Information / Studies Reviewed:    EKG is not ordered today. EKG from 06/27/22 reviewed which showed sinus rhythm        Risk Assessment/Calculations:    CHA2DS2-VASc Score = 3   This indicates a 3.2% annual risk of stroke. The patient's score is based upon: CHF History: 0 HTN History: 0 Diabetes History: 0 Stroke History: 0 Vascular Disease History: 1 (aortic atherosclerosis) Age Score: 1 Gender Score: 1             Physical Exam:   VS:  BP 114/76 (BP Location: Left Arm, Patient Position: Sitting, Cuff Size: Normal)   Pulse 74   Ht 5\' 4"  (1.626 m)   Wt 139 lb (63 kg)   LMP 05/25/2008   SpO2 98%   BMI 23.86 kg/m    Wt Readings from Last 3 Encounters:  07/12/23 139 lb (63 kg)  06/28/23 147 lb 9.6 oz (67 kg)  06/25/23 148 lb (67.1 kg)     GEN: Well nourished, well developed in no acute distress NECK: No JVD; No carotid bruits CARDIAC: Regular rate and rhythm, no murmurs, rubs, gallops RESPIRATORY:  Clear to auscultation without rales, wheezing or rhonchi  ABDOMEN: Soft, non-tender,  non-distended EXTREMITIES:  No edema; No deformity   ASSESSMENT AND PLAN:    1.  Paroxysmal atrial fibrillation: Currently on metoprolol and diltiazem.  Currently on flecainide 50 mg twice daily as well.  She is having vivid dreams and would prefer an alternative rhythm control strategy.  At this point, she would prefer ablation.  Risks and benefits were discussed.  She understands the risks and is agreed to the procedure.  Risk, benefits, and alternatives to EP study and radiofrequency/pulse field ablation for afib were also discussed in detail today. These risks include but are not limited to stroke, bleeding, vascular damage, tamponade, perforation, damage to the esophagus, lungs, and other structures, pulmonary vein stenosis, worsening renal function, and death. The patient understands these risk and wishes to proceed.  We Raziel Koenigs therefore proceed with catheter ablation at the next available time.  Carto, ICE, anesthesia are requested for the procedure.  Maiya Kates also obtain CT PV protocol prior to the procedure to exclude LAA thrombus and further evaluate atrial anatomy.  2.  Secondary hypercoagulable state: Currently on Eliquis 5 mg twice daily for atrial fibrillation  3.  PVCs: 7.3% burden on cardiac monitor.  Follow up with Dr. Elberta Fortis as usual post procedure  Signed, Jordon Bourquin Jorja Loa, MD

## 2023-07-12 NOTE — Addendum Note (Signed)
Addended by: Baird Lyons on: 07/12/2023 04:54 PM   Modules accepted: Orders

## 2023-07-12 NOTE — Progress Notes (Signed)
[facility] MD/PA/NP Initial Note  07/12/2023 11:57 AM Leah Reeves  MRN:  [id number]  Chief Complaint:   HPI:  Reports anxiety attacks after being put on BP and cholesterol medications.  Describes mood today as "not too good". Pleasant. Denies tearfulness. Mood symptoms - reports some depression - "getting up to do things and doesn't want to do them". Reports decreased interest and motivation. Reports lacking a desire to do usual activities. Denies irritability. Reports anxiety as 10 out of 10 - "I feel like I don't have control of my body and mind". Reports symptoms started about a month ago after her PCP made changes to her BP and cholesterol medication. Her PCP has since changed her back to her previous medication regimen. She continues to report increased anxiety - "not able to settle herself down". Reports one recent panic attack. Denies irritability. Reports worry, rumination and over thinking. Stating "I'm worried this thing is not going away. Reports decreased interest and motivation. Taking medications as prescribed. Reports her PCP recently increased the Wellbutrin XL 150mg  to 300mg   to help manage symptoms, but she does not feel any better.  Energy levels lower. Active, does not have a regular exercise routine.   Enjoys some usual interests and activities. Married. Lives with husband. Has a step son and 3 grand children. Spending time with family. Appetite decreased. Weight loss - 141 from 146 pounds. Sleeps well most nights. Averages 8 hours. Focus and concentration stable. Completing tasks. Managing aspects of household. 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  Denies AH or VH. Denies self harm. Denies substance use.  Previous medication trials: Denies  Visit Diagnosis:    ICD-10-CM   1. Generalized anxiety disorder  F41.1 ALPRAZolam (XANAX) 0.5 MG tablet      Past Psychiatric History: Denies psychiatric hospitalization.   Past Medical History:  Past Medical History:   Diagnosis Date   Anxiety    Arthritis    knees -generalized   Asthma    Atrial fibrillation (HCC)    Bronchitis    Depression    Headache    past history - none at present   Heart murmur    Osteopenia    Perimenopausal vasomotor symptoms    Vertigo    tx. 6 months ago- no problems now,very mild occ.    Past Surgical History:  Procedure Laterality Date   BREAST CYST ASPIRATION Right 2006   COLONOSCOPY WITH PROPOFOL N/A 09/18/2014   Procedure: COLONOSCOPY WITH PROPOFOL;  Surgeon: Charolett Bumpers, MD;  Location: [facility];  Service: Endoscopy;  Laterality: N/A;   NASAL SINUS SURGERY      Family Psychiatric History: Reports family history of mental illness.   Family History:  Family History  Problem Relation Age of Onset   Diabetes Mother    Hypertension Mother    Heart disease Father    Diabetes Sister    Heart disease Sister     Social History:  Social History   Socioeconomic History   Marital status: Married    Spouse name: Not on file   Number of children: Not on file   Years of education: Not on file   Highest education level: Not on file  Occupational History   Not on file  Tobacco Use   Smoking status: Former    Current packs/day: 0.00    Average packs/day: 0.5 packs/day for 3.0 years (1.5 ttl pk-yrs)    Types: Cigarettes    Start date: 05/25/1988    Quit date:  05/26/1991    Years since quitting: 32.1    Passive exposure: Past   Smokeless tobacco: Never   Tobacco comments:    Former smoker 04/29/22  Vaping Use   Vaping status: Never Used  Substance and Sexual Activity   Alcohol use: Not Currently   Drug use: No   Sexual activity: Yes    Partners: Male    Birth control/protection: Post-menopausal    Comment: 1st intercourse- 23, partners- 3, married- 27 yrs   Other Topics Concern   Not on file  Social History Narrative   Not on file   Social Drivers of Health   Financial Resource Strain: Not on file  Food Insecurity: Not on file   Transportation Needs: Not on file  Physical Activity: Not on file  Stress: Not on file  Social Connections: Not on file    Allergies:  Allergies  Allergen Reactions   Codeine Nausea And Vomiting   Azithromycin Rash    Metabolic Disorder Labs: Lab Results  Component Value Date   HGBA1C 5.6 11/09/2011   MPG 114 11/09/2011   No results found for: "PROLACTIN" Lab Results  Component Value Date   CHOL 170 11/09/2011   HDL 43 11/09/2011   Lab Results  Component Value Date   TSH 1.911 10/26/2017    Therapeutic Level Labs: No results found for: "LITHIUM" No results found for: "VALPROATE" No results found for: "CBMZ"  Current Medications: Current Outpatient Medications  Medication Sig Dispense Refill   ALPRAZolam (XANAX) 0.5 MG tablet Take 1 tablet (0.5 mg total) by mouth 3 (three) times daily as needed for anxiety. 90 tablet 2   apixaban (ELIQUIS) 5 MG TABS tablet TAKE 1 TABLET BY MOUTH TWICE  DAILY 200 tablet 1   Ascorbic Acid (VITAMIN C) 500 MG CHEW 500 mg daily.     buPROPion (WELLBUTRIN XL) 150 MG 24 hr tablet Take 150 mg by mouth every morning.     cholecalciferol (VITAMIN D) 1000 units tablet Take 1,000 Units by mouth daily.     clobetasol ointment (TEMOVATE) 0.05 % Apply 1 Application topically 2 (two) times a week. Thin application on affected vulva. 30 g 4   diltiazem (CARDIZEM) 30 MG tablet Take 1 tablet (30 mg total) by mouth 4 (four) times daily as needed. 30 tablet 1   flecainide (TAMBOCOR) 50 MG tablet TAKE 1 TABLET BY MOUTH TWICE A DAY 180 tablet 3   FLUoxetine (PROZAC) 20 MG capsule Take 40 mg by mouth every morning.     metoprolol succinate (TOPROL XL) 25 MG 24 hr tablet Take 0.5 tablets (12.5 mg total) by mouth daily. 45 tablet 3   nitroGLYCERIN (NITROSTAT) 0.4 MG SL tablet Place 0.4 mg under the tongue every 5 (five) minutes as needed for chest pain.     simvastatin (ZOCOR) 40 MG tablet Take 1 tablet (40 mg total) by mouth every morning. 90 tablet 1   No  current facility-administered medications for this visit.    Medication Side Effects: none  Orders placed this visit:  No orders of the defined types were placed in this encounter.   Psychiatric Specialty Exam:  Review of Systems  Musculoskeletal:  Negative for gait problem.  Neurological:  Negative for tremors.  Psychiatric/Behavioral:         Please refer to HPI    Blood pressure 131/71, pulse 93, height 5\' 4"  (1.626 m), weight 141 lb (64 kg), last menstrual period 05/25/2008.Body mass index is 24.2 kg/m.  General Appearance: Casual  Eye Contact:  Absent  Speech:  Clear and Coherent and Normal Rate  Volume:  Normal  Mood:  Anxious  Affect:  Appropriate and Congruent  Thought Process:  Coherent and Descriptions of Associations: Intact  Orientation:  Full (Time, Place, and Person)  Thought Content: Logical   Suicidal Thoughts:  No  Homicidal Thoughts:  No  Memory:  WNL  Judgement:  Good  Insight:  Good  Psychomotor Activity:  Normal  Concentration:  Concentration: Good and Attention Span: Good  Recall:  Good  Fund of Knowledge: Good  Language: Good  Assets:  Communication Skills Desire for Improvement Financial Resources/Insurance Housing Intimacy Leisure Time Physical Health Resilience Social Support Talents/Skills Transportation Vocational/Educational  ADL's:  Intact  Cognition: WNL  Prognosis:  Good   Screenings:  Flowsheet Row ED from [facility]/20[facility] Emergency Department at [facility]  C-SSRS RISK CATEGORY No Risk       Receiving Psychotherapy: No   Treatment Plan/Recommendations:   Prozac 40mg  daily Wellbutrin XL 150mg  - 2 daily - decrease dose 150mg  daily Increase Xanax 0.25mg  to 0.5mg  TID - temporary increase until anxiety is more manageable and then plan to taper off.  RTC 4 weeks   Discussed potential benefits, risk, and side effects of benzodiazepines to include potential risk of tolerance and dependence, as well as  possible drowsiness.  Advised patient not to drive if experiencing drowsiness and to take lowest possible effective dose to minimize risk of dependence and tolerance.   Dorothyann Gibbs, NP

## 2023-07-20 ENCOUNTER — Ambulatory Visit (INDEPENDENT_AMBULATORY_CARE_PROVIDER_SITE_OTHER): Payer: Medicare Other | Admitting: Psychiatry

## 2023-07-20 DIAGNOSIS — F411 Generalized anxiety disorder: Secondary | ICD-10-CM

## 2023-07-20 NOTE — Progress Notes (Signed)
 [facility] Counselor Initial Adult Exam  Name: Leah Reeves Date: 07/20/2023 MRN: [id number] DOB: April 09, 1951 PCP: Lorenda Ishihara, MD  Time spent: 60 minutes  Guardian/Payee:  patient    Paperwork requested:  No   Reason for Visit /Presenting Problem:  anxiety, "shaking at times", I have to make myself not go lay down during the day, had some med from her meds Dr, and then saw med provider at our office R. Mozingo re: medications and will be following up with her. "Sad I'm not better and not able to do my normal schedule because of my anxiety."  Mental Status Exam:    Appearance:   Well Groomed     Behavior:  Appropriate and Sharing  Motor:  Normal  Speech/Language:   Clear and Coherent  Affect:  anxious  Mood:  anxious  Thought process:  goal directed  Thought content:    WNL  Sensory/Perceptual disturbances:    WNL  Orientation:  oriented to person, place, time/date, situation, day of week, month of year, year, and stated date of Feb. 25, 2025  Attention:  Good  Concentration:  Good  Memory:  WNL  Fund of knowledge:   Good  Insight:    Good and Fair  Judgment:   Good  Impulse Control:  Good   Reported Symptoms:  see above  Risk Assessment: Danger to Self:  No Self-injurious Behavior: No Danger to Others: No Duty to Warn:no Physical Aggression / Violence:No  Access to Firearms a concern: No  Gang Involvement:No  Patient / guardian was educated about steps to take if suicide or homicide risk level increases between visits: yes While future psychiatric events cannot be accurately predicted, the patient does not currently require acute inpatient psychiatric care and does not currently meet [facility] involuntary commitment criteria.  Substance Abuse History: Current substance abuse: No     Past Psychiatric History:   No previous psychological problems have been observed Outpatient Providers:psychiatrist "don't recall his name" History of Psych  Hospitalization: No  Psychological Testing:  n/a    Abuse History: Victim of No.,  n/a    Report needed: No. Victim of Neglect:No. Perpetrator of  n/a   Witness / Exposure to Domestic Violence: No   Protective Services Involvement: No  Witness to [employer] Violence:  No   Family History: mother and sister also had anxiety per patient report Family History  Problem Relation Age of Onset   Diabetes Mother    Hypertension Mother    Heart disease Father    Diabetes Sister    Heart disease Sister     Living situation: the patient lives with their spouse, who is very supportive.  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   Any cultural differences that may affect / interfere with treatment:  not applicable   Recreation/Hobbies: yard work, shopping and experience new things  Stressors:Health problems   Other: has a-fib    Strengths:  Supportive Relationships, Family, Friends, Church, Spirituality, Hopefulness, and Able  to Communicate Effectively  Barriers:  "over-focusing on my anxiety and problems"   Legal History: Pending legal issue / charges: The patient has no significant history of legal issues. History of legal issue / charges:  none  Medical History/Surgical History:reviewed and patient agreed Past Medical History:  Diagnosis Date   Anxiety    Arthritis    knees -generalized   Asthma    Atrial fibrillation (HCC)    Bronchitis    Depression    Headache    past history - none at present   Heart murmur     Osteopenia    Perimenopausal vasomotor symptoms    Vertigo    tx. 6 months ago- no problems now,very mild occ.    Past Surgical History:  Procedure Laterality Date   BREAST CYST ASPIRATION Right 2006   COLONOSCOPY WITH PROPOFOL N/A 09/18/2014   Procedure: COLONOSCOPY WITH PROPOFOL;  Surgeon: Charolett Bumpers, MD;  Location: [facility];  Service: Endoscopy;  Laterality: N/A;   NASAL SINUS SURGERY      Medications:Patient confirms. Current Outpatient Medications  Medication Sig Dispense Refill   ALPRAZolam (XANAX) 0.5 MG tablet Take 1 tablet (0.5 mg total) by mouth 3 (three) times daily as needed for anxiety. 90 tablet 2   apixaban (ELIQUIS) 5 MG TABS tablet TAKE 1 TABLET BY MOUTH TWICE  DAILY 200 tablet 1   Ascorbic Acid (VITAMIN C) 500 MG CHEW 500 mg daily.     buPROPion (WELLBUTRIN XL) 150 MG 24 hr tablet Take 150 mg by mouth every morning.     cholecalciferol (VITAMIN D) 1000 units tablet Take 1,000 Units by mouth daily.     clobetasol ointment (TEMOVATE) 0.05 % Apply 1 Application topically 2 (two) times a week. Thin application on affected vulva. 30 g 4   diltiazem (CARDIZEM) 30 MG tablet Take 1 tablet (30 mg total) by mouth 4 (four) times daily as needed. 30 tablet 1   flecainide (TAMBOCOR) 50 MG tablet TAKE 1 TABLET BY MOUTH TWICE A DAY 180 tablet 3   FLUoxetine (PROZAC) 20 MG capsule Take 40 mg by mouth every morning.     metoprolol succinate (TOPROL XL) 25 MG 24 hr tablet Take 0.5 tablets (12.5 mg total) by mouth daily. 45 tablet 3   nitroGLYCERIN (NITROSTAT) 0.4 MG SL tablet Place 0.4 mg under the tongue every 5 (five) minutes as needed for chest pain.     simvastatin (ZOCOR) 40 MG tablet Take 1 tablet (40 mg total) by mouth every morning. 90 tablet 1   No current facility-administered medications for this visit.    Allergies  Allergen Reactions   Codeine Nausea And Vomiting   Azithromycin Rash    Diagnoses:    ICD-10-CM   1. Generalized anxiety disorder  F41.1       Treatment goal plan of care: Worked with patient collaboratively on her treatment plan and she is in agreement with it.  Her goals will remain on treatment plan as patient works with strategies in sessions and outside of sessions to meet her goals.  Progress is assessed each session and documented in the "plan" or "progress" sections of treatment note.  1.Reduce overall level, frequency, and intensity of the anxiety so that daily functioning is not impaired. 2.Verbalize an understanding of the role that fearful thinking plays in creating fears, excessive worry, and persistent anxiety symptoms. 3.Develop behavioral ad cognitive stategies to reduce or eliminate the irrational anxiety.   Plan of Care:  This is a first  appointment with this patient in today we collaboratively completed her initial evaluation and initial treatment goal plan.  Leah Reeves is a 73 year old pleasant female patient who has been married to her husband for 33 years.  She has 1 stepson, age 59, who is married with 3 children ages 54, 64, and 46.  Patient reports that she basically has "1 friend and that is a female cousin".  She also reports a good strong history of closeness within her family.  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  She retired 3 years ago and did part-time work for a while but officially retired soon after.  Patient and her husband attend church regularly, previously attended a church in [place] but more recently attended 1 where some of their relatives attend.  States that she has received psychiatric care before approximately 25 years ago and has not needed help again until more recently.  Also seemed to be puzzled as to what started her more recent issues but glad to be back seeing someone and getting help.  Her medications are managed by nurse practitioner, Yvette Rack through our office.  Patient is well-groomed, is appropriate and sharing information freely, motor  skills are normal, speech and language are clear and coherent, affect and mood both reflect anxiety, thought process is goal-directed, well oriented to person/place/time/date/situation/day of week/month of year/year and today stated date of July 20, 2023.  Patient's attention is good, memory is within normal limits, her concentration and judgment are good, her insight is good/fair, and judgment and impulse control are both good.  Denies any substance abuse.  No history of abuse.  Patient lives with her spouse who is very supportive.  She mention briefly about an adult granddaughter who made some choices that patient did not necessarily agree with but loves her regardless.  (Not all details included in this note due to patient privacy needs).  Patient reports no financial stressors and receives pension and Social Security retirement.  She enjoys yard work, shopping and experiencing new things.  Does have some health problems including A-fib.  She states her only barrier to treatment might be "over focusing on my anxiety and problems".  She lists her strengths as being: Supportive relationships, family, friends, church, spirituality, hopefulness, and ability to communicate effectively with all sorts of people.  She has no prior legal charges of any type.  Patient worked well in session on her treatment goal plan which we did complete and she is already get started on her goals (as stated above) today.  Patient adds that she would like to "get back to being like I was before, not being nervous all the time, being able to feel joy, and look forward to the day and being alive".  "I want my mood to change and not feel so anxious and jittery."  Also stated "I want to be there for my family and see them more often.".  Feels that she may need to "work through some feelings about my granddaughters choices.  Patient reports that she does not see any obstacles that would keep her from working on her treatment goals.   Reports that her strong points are "I am a hard worker, a supportive person, a loving and caring person.  Review of initial treatment goal plan and patient is in agreement.  Next appointment within 2 weeks.   Mathis Fare, LCSW

## 2023-07-28 ENCOUNTER — Telehealth (INDEPENDENT_AMBULATORY_CARE_PROVIDER_SITE_OTHER): Admitting: Adult Health

## 2023-07-28 ENCOUNTER — Encounter: Payer: Self-pay | Admitting: Adult Health

## 2023-07-28 DIAGNOSIS — F411 Generalized anxiety disorder: Secondary | ICD-10-CM

## 2023-07-28 NOTE — Addendum Note (Signed)
 Addended by: Dorothyann Gibbs on: 07/28/2023 11:38 AM   Modules accepted: Level of Service

## 2023-07-28 NOTE — Progress Notes (Addendum)
 Leah Reeves [id number] 17-Jul-1950 73 y.o.  Virtual Visit via Video Note  I connected with pt @ on 07/28/23 at 11:00 AM EST by a video enabled telemedicine application and verified that I am speaking with the correct person using two identifiers.   I discussed the limitations of evaluation and management by telemedicine and the availability of in person appointments. The patient expressed understanding and agreed to proceed.  I discussed the assessment and treatment plan with the patient. The patient was provided an opportunity to ask questions and all were answered. The patient agreed with the plan and demonstrated an understanding of the instructions.   The patient was advised to call back or seek an in-person evaluation if the symptoms worsen or if the condition fails to improve as anticipated.  I provided 25 minutes of non-face-to-face time during this encounter.  The patient was located at home.  The provider was located at [facility].   Dorothyann Gibbs, NP   Subjective:   Patient ID:  Leah Reeves is a 73 y.o. (DOB May 31, 1950) female.  Chief Complaint: No chief complaint on file.   HPI Leah Reeves presents for follow-up of GAD.  Describes mood today as "a little better". Pleasant. Denies tearfulness. Mood symptoms - reports some depression - "trying to push herself to do things". Reports decreased interest and motivation. Denies irritability. Reports decreased anxiety - 7 out of 10. Stating "I feel like I have more control over my body, but not as much with my mind". Denies recent panic attack. Denies irritability. Reports decreased worry, rumination and over thinking. Stating "I feel like I'm doing better than I was". Reports improved interest and motivation. Taking medications as prescribed.  Energy levels lower. Active, does not have a regular exercise routine.   Enjoys some usual interests and activities. Married. Lives with husband. Has a step son and  3 grand children. Spending time with family. Appetite decreased. Weight loss - 139 from 146 pounds. Sleeps well most nights. Averages 8 hours. Reports daytime napping.  Focus and concentration stable. Completing tasks. Managing aspects of household. 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  Denies AH or VH. Denies self harm. Denies substance use.  Previous medication trials: Denies  Review of Systems:  Review of Systems  Musculoskeletal:  Negative for gait problem.  Neurological:  Negative for tremors.  Psychiatric/Behavioral:         Please refer to HPI    Medications: I have reviewed the patient's current medications.  Current Outpatient Medications  Medication Sig Dispense Refill   ALPRAZolam (XANAX) 0.5 MG tablet Take 1 tablet (0.5 mg total) by mouth 3 (three) times daily as needed for anxiety. 90 tablet 2   apixaban (ELIQUIS) 5 MG TABS tablet TAKE 1 TABLET BY MOUTH TWICE  DAILY 200 tablet 1   Ascorbic Acid (VITAMIN C) 500 MG CHEW 500 mg daily.     buPROPion (WELLBUTRIN XL) 150 MG 24 hr tablet Take 150 mg by mouth every morning.     cholecalciferol (VITAMIN D) 1000 units tablet Take 1,000 Units by mouth daily.     clobetasol ointment (TEMOVATE) 0.05 % Apply 1 Application topically 2 (two) times a week. Thin application on affected vulva. 30 g 4   diltiazem (CARDIZEM) 30 MG tablet Take 1 tablet (30 mg total) by mouth 4 (four) times daily as needed. 30 tablet 1   flecainide (TAMBOCOR) 50 MG tablet TAKE 1 TABLET BY MOUTH TWICE A DAY 180 tablet 3  FLUoxetine (PROZAC) 20 MG capsule Take 40 mg by mouth every morning.     metoprolol succinate (TOPROL XL) 25 MG 24 hr tablet Take 0.5 tablets (12.5 mg total) by mouth daily. 45 tablet 3   nitroGLYCERIN (NITROSTAT) 0.4 MG SL tablet Place 0.4 mg under the tongue every 5 (five) minutes as needed for chest pain.     simvastatin (ZOCOR) 40 MG tablet Take 1 tablet (40 mg total) by mouth every morning. 90 tablet 1   No current facility-administered  medications for this visit.    Medication Side Effects: None  Allergies:  Allergies  Allergen Reactions   Codeine Nausea And Vomiting   Azithromycin Rash    Past Medical History:  Diagnosis Date   Anxiety    Arthritis    knees -generalized   Asthma    Atrial fibrillation (HCC)    Bronchitis    Depression    Headache    past history - none at present   Heart murmur    Osteopenia    Perimenopausal vasomotor symptoms    Vertigo    tx. 6 months ago- no problems now,very mild occ.    Family History  Problem Relation Age of Onset   Diabetes Mother    Hypertension Mother    Heart disease Father    Diabetes Sister    Heart disease Sister     Social History   Socioeconomic History   Marital status: Married    Spouse name: Not on file   Number of children: Not on file   Years of education: Not on file   Highest education level: Not on file  Occupational History   Not on file  Tobacco Use   Smoking status: Former    Current packs/day: 0.00    Average packs/day: 0.5 packs/day for 3.0 years (1.5 ttl pk-yrs)    Types: Cigarettes    Start date: 05/25/1988    Quit date: 05/26/1991    Years since quitting: 32.1    Passive exposure: Past   Smokeless tobacco: Never   Tobacco comments:    Former smoker 04/29/22  Vaping Use   Vaping status: Never Used  Substance and Sexual Activity   Alcohol use: Not Currently   Drug use: No   Sexual activity: Yes    Partners: Male    Birth control/protection: Post-menopausal    Comment: 1st intercourse- 23, partners- 3, married- 27 yrs   Other Topics Concern   Not on file  Social History Narrative   Not on file   Social Drivers of Health   Financial Resource Strain: Not on file  Food Insecurity: Not on file  Transportation Needs: Not on file  Physical Activity: Not on file  Stress: Not on file  Social Connections: Not on file  Intimate Partner Violence: Not on file    Past Medical History, Surgical history, Social  history, and Family history were reviewed and updated as appropriate.   Please see review of systems for further details on the patient's review from today.   Objective:   Physical Exam:  LMP 05/25/2008   Physical Exam Constitutional:      General: She is not in acute distress. Musculoskeletal:        General: No deformity.  Neurological:     Mental Status: She is alert and oriented to person, place, and time.     Coordination: Coordination normal.  Psychiatric:        Attention and Perception: Attention and perception normal. She does  not perceive auditory or visual hallucinations.        Mood and Affect: Affect is not labile, blunt, angry or inappropriate.        Speech: Speech normal.        Behavior: Behavior normal.        Thought Content: Thought content normal. Thought content is not paranoid or delusional. Thought content does not include homicidal or suicidal ideation. Thought content does not include homicidal or suicidal plan.        Cognition and Memory: Cognition and memory normal.        Judgment: Judgment normal.     Comments: Insight intact     Lab Review:     Component Value Date/Time   NA 140 05/01/2019 1331   K 4.1 05/01/2019 1331   CL 106 05/01/2019 1331   CO2 24 10/26/2017 1214   GLUCOSE 114 (H) 05/01/2019 1331   BUN 12 05/01/2019 1331   CREATININE 0.70 05/01/2019 1331   CALCIUM 9.5 10/26/2017 1214   GFRNONAA >60 10/26/2017 1214   GFRAA >60 10/26/2017 1214       Component Value Date/Time   WBC 9.1 10/26/2017 1214   RBC 5.15 (H) 10/26/2017 1214   HGB 14.3 05/01/2019 1331   HCT 42.0 05/01/2019 1331   PLT 370 10/26/2017 1214   MCV 91.8 10/26/2017 1214   MCH 29.7 10/26/2017 1214   MCHC 32.3 10/26/2017 1214   RDW 12.8 10/26/2017 1214   LYMPHSABS 3.5 09/09/2017 1706   MONOABS 0.8 09/09/2017 1706   EOSABS 0.4 09/09/2017 1706   BASOSABS 0.1 09/09/2017 1706    No results found for: "POCLITH", "LITHIUM"   No results found for: "PHENYTOIN",  "PHENOBARB", "VALPROATE", "CBMZ"   .res Assessment: Plan:    Treatment Plan/Recommendations:   Prozac 40mg  daily Wellbutrin XL 150mg  daily  Reduce Xanax - Xanax 0.25mg  BID and 0.5mg  at bedtime - leave off morning dose for one week, then leave off afternoon dose for one week. Will then continue to taper down on the bedtime dosing. Patient's level of anxiety and situational stressors have decreased.   RTC 2 weeks   25 minutes spent dedicated to the care of this patient on the date of this encounter to include pre-visit review of records, ordering of medication, post visit documentation, and face-to-face time with the patient discussing GAD. Discussed medication changes with patient - will plan to reduce Xanax dose with anxiety improving.    Discussed potential benefits, risk, and side effects of benzodiazepines to include potential risk of tolerance and dependence, as well as possible drowsiness.  Advised patient not to drive if experiencing drowsiness and to take lowest possible effective dose to minimize risk of dependence and tolerance.   Diagnoses and all orders for this visit:  Generalized anxiety disorder   Please see After Visit Summary for patient specific instructions.  Future Appointments  Date Time Provider Department Center  08/04/2023  9:00 AM Mathis Fare, LCSW [facility] None  08/18/2023 10:00 AM Mathis Fare, LCSW [facility] None  08/19/2023 10:00 AM [facility] 1 [facility]  09/01/2023 10:00 AM Mathis Fare, LCSW [facility] None    No orders of the defined types were placed in this encounter.     -------------------------------

## 2023-08-04 ENCOUNTER — Ambulatory Visit (INDEPENDENT_AMBULATORY_CARE_PROVIDER_SITE_OTHER): Payer: Self-pay | Admitting: Psychiatry

## 2023-08-04 DIAGNOSIS — F411 Generalized anxiety disorder: Secondary | ICD-10-CM | POA: Diagnosis not present

## 2023-08-04 NOTE — Progress Notes (Signed)
 [facility] Counselor/Therapist Progress Note  Patient ID: Leah Reeves, MRN: [id number],    Date: 08/04/2023  Time Spent: 55 minutes   Treatment Type: Individual Therapy  Reported Symptoms:  anxiety, "shaking at times", have to refrain from going and laying down during the day, remaining "on my meds from [facility]. Is some better. Overfocuses on anxiety and problems. "People-Pleasing".    Mental Status Exam:  Appearance:   Casual and Neat     Behavior:  Appropriate, Sharing, and Motivated  Motor:  Normal  Speech/Language:   Clear and Coherent  Affect:  anxious  Mood:  anxious  Thought process:  goal directed  Thought content:    Rumination  Sensory/Perceptual disturbances:    WNL  Orientation:  oriented to person, place, time/date, situation, day of week, month of year, year, and stated date of August 04, 2023  Attention:  Good  Concentration:  Good  Memory:  WNL  Fund of knowledge:   Good  Insight:    Good and Fair  Judgment:   Good  Impulse Control:  Good   Risk Assessment: Danger to Self:  No Self-injurious Behavior: No Danger to Others: No Duty to Warn:no Physical Aggression / Violence:No  Access to Firearms a concern: No  Gang Involvement:No   Subjective:  Patient today being seen as a follow-up appointment after her initial evaluation 2 weeks ago.  She presents as motivated, pleasant, fearful at times, noticing some progress in her anxiety, and firmly believes her anxiety started about 30 yrs ago when she was coming off Xanax. Shared more about that experience and how traumatic it was for patient. Reports feeling nausea due to her meds at times. Does feel she is continuing to progress some with decrease in her shaking, but my writing is shaking. Feels she is getting some better in some ways. Sees it as her job to be the "people pleaser" and keep everybody happy. Needing healthy boundaries and worked more specifically on this today. Improving in her  mindset "to be more positive" and to understand better her thoughts and what helps her lean in a more positive direction.  Refrain from looking backwards and making assumptions that can impact her in negative ways currently.  Good attitude.  Showing more strength.  Will work further on issues with granddaughter in future session.  Needed more grounding today and noticing more positives without thinking as much about what could interrupt her progress.  Interventions: Cognitive Behavioral Therapy and Ego-Supportive  1.Reduce overall level, frequency, and intensity of the anxiety so that daily functioning is not impaired. 2.Verbalize an understanding of the role that fearful thinking plays in creating fears, excessive worry, and persistent anxiety symptoms. 3.Develop behavioral and cognitive stategies to reduce or eliminate the irrational anxiety.   Diagnosis:   ICD-10-CM   1. Generalized anxiety disorder  F41.1      Plan:  Patient in today after having had her initial appointment 2 weeks ago.  Was able to share some additional information as well as prioritizing her needs and goals to move forward.  Her main concern is her anxiety and how it interrupts her being able to follow her normal schedule of activities and interests.  Participated well in session today and already showing some progress, and talks through how she plans to hold onto her progress.   Goal review and progress/challenges noted with patient.  Next appointment within 2 to 3 weeks.   Mathis Fare, LCSW

## 2023-08-06 ENCOUNTER — Other Ambulatory Visit: Payer: Self-pay

## 2023-08-06 DIAGNOSIS — Z01812 Encounter for preprocedural laboratory examination: Secondary | ICD-10-CM

## 2023-08-06 DIAGNOSIS — I480 Paroxysmal atrial fibrillation: Secondary | ICD-10-CM

## 2023-08-10 ENCOUNTER — Telehealth (HOSPITAL_COMMUNITY): Payer: Self-pay

## 2023-08-10 NOTE — Telephone Encounter (Signed)
 Attempted to reach patient to discuss upcoming procedure, no answer and no VM available.

## 2023-08-12 ENCOUNTER — Ambulatory Visit: Payer: Self-pay | Admitting: Adult Health

## 2023-08-17 DIAGNOSIS — Z79899 Other long term (current) drug therapy: Secondary | ICD-10-CM | POA: Diagnosis not present

## 2023-08-17 DIAGNOSIS — I480 Paroxysmal atrial fibrillation: Secondary | ICD-10-CM | POA: Diagnosis not present

## 2023-08-17 LAB — LAB REPORT - SCANNED: EGFR: 86

## 2023-08-18 ENCOUNTER — Telehealth: Admitting: Adult Health

## 2023-08-18 ENCOUNTER — Ambulatory Visit (INDEPENDENT_AMBULATORY_CARE_PROVIDER_SITE_OTHER): Payer: Self-pay | Admitting: Psychiatry

## 2023-08-18 DIAGNOSIS — F411 Generalized anxiety disorder: Secondary | ICD-10-CM

## 2023-08-18 NOTE — Progress Notes (Signed)
 [facility] Counselor/Therapist Progress Note  Patient ID: Leah Reeves, MRN: [id number],    Date: 08/18/2023  Time Spent: 48 minutes   Treatment Type: Individual Therapy  Reported Symptoms: anxiety (improved), "shaking at times", "it's getting easier not to nap during the day", remaining on her meds, less focus on anxiety and problems,  getting out with husband more, some less people-pleasing, more interaction with her grandsons   Mental Status Exam:  Appearance:   Casual and Neat     Behavior:  Appropriate, Sharing, and Motivated  Motor:  Normal  Speech/Language:   Clear and Coherent  Affect:  anxiety  Mood:  anxious  Thought process:  goal directed  Thought content:    WNL  Sensory/Perceptual disturbances:    WNL  Orientation:  oriented to person, place, time/date, situation, day of week, month of year, year, and stated date of August 18, 2023  Attention:  Good  Concentration:  Good  Memory:  WNL  Fund of knowledge:   Good  Insight:    Good  Judgment:   Good  Impulse Control:  Good   Risk Assessment: Danger to Self:  No Self-injurious Behavior: No Danger to Others: No Duty to Warn:no Physical Aggression / Violence:No  Access to Firearms a concern: No  Gang Involvement:No   Subjective: Patient in session today and seems pleasant and motivated, along with some fear at times but noticing already some progress in her anxiety.  She had noted previous session that she realized her anxiety actually started about 30 years ago when she was working to come off Xanax.  As she noted in sessions that was a very traumatic time for her but did get through it.  Had some lingering morning shakiness that she still sometimes notices but reports it is decreased except when she writes she notices it more.  Does overall feel that she is getting better.  Today talks further about her tendency to be a "people pleaser" and keep everyone happy and satisfied, while overlooking some of her  own needs including the need for boundaries. Working more today on her "people pleasing" and reports she has "backed off those old habits" and letting others do more especially when family/adult grandkids come to visit. Continues work on healthier boundaries. Working to encourage herself to get out more, and patient is following through. Continues "trying to be more positive in my outlook, because I'm better when I'm more positive". Working to not stay inside the house as much and is already getting out a little more gradually. Talking more openly without coaching her along. Not shaking so much every morning, some shaking but noticeably better. "Working on being more positive and am already making progress." Trying to hang onto her gains thus far and feeling better about herself.   Interventions: Cognitive Behavioral Therapy and Ego-Supportive 1.Reduce overall level, frequency, and intensity of the anxiety so that daily functioning is not impaired. 2.Verbalize an understanding of the role that fearful thinking plays in creating fears, excessive worry, and persistent anxiety symptoms. 3.Develop behavioral and cognitive stategies to reduce or eliminate the irrational anxiety.   Diagnosis:   ICD-10-CM   1. Generalized anxiety disorder  F41.1      Plan: Patient today motivated and continues to work on her goals related to her anxiety, less negative assumptions, and not "assuming the worst". As noted above patient has really been motivated in working on her goals and noticing progress "to the point I don't want to  go backwards."  "I do think about my anxiety but I do not dwell on it, and see myself doing better , and have found books and TV programming that "is religious and I like it, as it does help me with my being calmer and less anxious." Patient participating well today in session, sharing her progress and how she wants to hold onto it.  Goal review and progress/challenges noted with patient.  Next  appointment within 2 to 3 weeks.   Mathis Fare, LCSW

## 2023-08-19 ENCOUNTER — Ambulatory Visit (HOSPITAL_COMMUNITY)
Admission: RE | Admit: 2023-08-19 | Discharge: 2023-08-19 | Disposition: A | Discharge status: Home / Self Care | Payer: Medicare Other | Source: Ambulatory Visit | Attending: Cardiology | Admitting: Cardiology

## 2023-08-19 DIAGNOSIS — I480 Paroxysmal atrial fibrillation: Secondary | ICD-10-CM | POA: Diagnosis not present

## 2023-08-19 MED ORDER — IOHEXOL 350 MG/ML SOLN
75.0000 mL | Freq: Once | INTRAVENOUS | Status: AC | PRN
  Administered 2023-08-19: 75 mL via INTRAVENOUS

## 2023-08-23 ENCOUNTER — Encounter: Payer: Self-pay | Admitting: Adult Health

## 2023-08-23 ENCOUNTER — Ambulatory Visit (INDEPENDENT_AMBULATORY_CARE_PROVIDER_SITE_OTHER): Admitting: Adult Health

## 2023-08-23 DIAGNOSIS — F411 Generalized anxiety disorder: Secondary | ICD-10-CM | POA: Diagnosis not present

## 2023-08-23 MED ORDER — ALPRAZOLAM 0.25 MG PO TABS: ORAL_TABLET | ORAL | 0 refills | Status: DC

## 2023-08-23 NOTE — Progress Notes (Signed)
 Leah Reeves [id number] [birth year] 73 y.o.  Virtual Visit via Video Note  I connected with pt @ on 08/23/23 at 11:30 AM EDT by a video enabled telemedicine application and verified that I am speaking with the correct person using two identifiers.   I discussed the limitations of evaluation and management by telemedicine and the availability of in person appointments. The patient expressed understanding and agreed to proceed.  I discussed the assessment and treatment plan with the patient. The patient was provided an opportunity to ask questions and all were answered. The patient agreed with the plan and demonstrated an understanding of the instructions.   The patient was advised to call back or seek an in-person evaluation if the symptoms worsen or if the condition fails to improve as anticipated.  I provided 25 minutes of non-face-to-face time during this encounter.  The patient was located at home.  The provider was located at [facility].   Dorothyann Gibbs, NP   Subjective:   Patient ID:  Leah Reeves is a 73 y.o. (DOB 02/24/1951) female.  Chief Complaint: No chief complaint on file.   HPI Leah Reeves presents for follow-up of GAD.  Describes mood today as "better". Pleasant. Denies tearfulness. Mood symptoms - denies depression an irritability. Reports improved interest and motivation. Reports improved interest and motivation. Reports decreased anxiety - 1 out of 10. Denies recent panic attack. Reports decreased worry, rumination and over thinking. Stating "I feel like I'm doing better". Taking medications as prescribed.  Energy levels improved. Active, does not have a regular exercise routine.   Enjoys some usual interests and activities. Married. Lives with husband. Has a step son and 3 grand children. Spending time with family. Appetite adequate. Weight stable.  Sleeps well most nights. Averages 7 hours. Reports occasional daytime napping.  Focus and  concentration stable. Completing tasks. Managing aspects of household. 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  Denies AH or VH. Denies self harm. Denies substance use.  Previous medication trials: Denies  Review of Systems:  Review of Systems  Musculoskeletal:  Negative for gait problem.  Neurological:  Negative for tremors.  Psychiatric/Behavioral:         Please refer to HPI    Medications: I have reviewed the patient's current medications.  Current Outpatient Medications  Medication Sig Dispense Refill   ALPRAZolam (XANAX) 0.25 MG tablet Take 1/2 tablet three times daily. 90 tablet 0   apixaban (ELIQUIS) 5 MG TABS tablet TAKE 1 TABLET BY MOUTH TWICE  DAILY 200 tablet 1   Ascorbic Acid (VITAMIN C) 500 MG CHEW 500 mg daily.     buPROPion (WELLBUTRIN XL) 150 MG 24 hr tablet Take 150 mg by mouth every morning.     cholecalciferol (VITAMIN D) 1000 units tablet Take 1,000 Units by mouth daily.     clobetasol ointment (TEMOVATE) 0.05 % Apply 1 Application topically 2 (two) times a week. Thin application on affected vulva. 30 g 4   diltiazem (CARDIZEM) 30 MG tablet Take 1 tablet (30 mg total) by mouth 4 (four) times daily as needed. 30 tablet 1   flecainide (TAMBOCOR) 50 MG tablet TAKE 1 TABLET BY MOUTH TWICE A DAY 180 tablet 3   FLUoxetine (PROZAC) 20 MG capsule Take 40 mg by mouth every morning.     metoprolol succinate (TOPROL XL) 25 MG 24 hr tablet Take 0.5 tablets (12.5 mg total) by mouth daily. 45 tablet 3   nitroGLYCERIN (NITROSTAT) 0.4 MG SL tablet Place  0.4 mg under the tongue every 5 (five) minutes as needed for chest pain.     simvastatin (ZOCOR) 40 MG tablet Take 1 tablet (40 mg total) by mouth every morning. 90 tablet 1   No current facility-administered medications for this visit.    Medication Side Effects: None  Allergies:  Allergies  Allergen Reactions   Codeine Nausea And Vomiting   Azithromycin Rash    Past Medical History:  Diagnosis Date   Anxiety     Arthritis    knees -generalized   Asthma    Atrial fibrillation (HCC)    Bronchitis    Depression    Headache    past history - none at present   Heart murmur    Osteopenia    Perimenopausal vasomotor symptoms    Vertigo    tx. 6 months ago- no problems now,very mild occ.    Family History  Problem Relation Age of Onset   Diabetes Mother    Hypertension Mother    Heart disease Father    Diabetes Sister    Heart disease Sister     Social History   Socioeconomic History   Marital status: Married    Spouse name: Not on file   Number of children: Not on file   Years of education: Not on file   Highest education level: Not on file  Occupational History   Not on file  Tobacco Use   Smoking status: Former    Current packs/day: 0.00    Average packs/day: 0.5 packs/day for 3.0 years (1.5 ttl pk-yrs)    Types: Cigarettes    Start date: 05/25/1988    Quit date: 05/26/1991    Years since quitting: 32.2    Passive exposure: Past   Smokeless tobacco: Never   Tobacco comments:    Former smoker 04/29/22  Vaping Use   Vaping status: Never Used  Substance and Sexual Activity   Alcohol use: Not Currently   Drug use: No   Sexual activity: Yes    Partners: Male    Birth control/protection: Post-menopausal    Comment: 1st intercourse- 23, partners- 3, married- 27 yrs   Other Topics Concern   Not on file  Social History Narrative   Not on file   Social Drivers of Health   Financial Resource Strain: Not on file  Food Insecurity: Not on file  Transportation Needs: Not on file  Physical Activity: Not on file  Stress: Not on file  Social Connections: Not on file  Intimate Partner Violence: Not on file    Past Medical History, Surgical history, Social history, and Family history were reviewed and updated as appropriate.   Please see review of systems for further details on the patient's review from today.   Objective:   Physical Exam:  LMP 05/25/2008   Physical  Exam Constitutional:      General: She is not in acute distress. Musculoskeletal:        General: No deformity.  Neurological:     Mental Status: She is alert and oriented to person, place, and time.     Coordination: Coordination normal.  Psychiatric:        Attention and Perception: Attention and perception normal. She does not perceive auditory or visual hallucinations.        Mood and Affect: Affect is not labile, blunt, angry or inappropriate.        Speech: Speech normal.        Behavior: Behavior normal.  Thought Content: Thought content normal. Thought content is not paranoid or delusional. Thought content does not include homicidal or suicidal ideation. Thought content does not include homicidal or suicidal plan.        Cognition and Memory: Cognition and memory normal.        Judgment: Judgment normal.     Comments: Insight intact     Lab Review:     Component Value Date/Time   NA 140 05/01/2019 1331   K 4.1 05/01/2019 1331   CL 106 05/01/2019 1331   CO2 24 10/26/2017 1214   GLUCOSE 114 (H) 05/01/2019 1331   BUN 12 05/01/2019 1331   CREATININE 0.70 05/01/2019 1331   CALCIUM 9.5 10/26/2017 1214   GFRNONAA >60 10/26/2017 1214   GFRAA >60 10/26/2017 1214       Component Value Date/Time   WBC 9.1 10/26/2017 1214   RBC 5.15 (H) 10/26/2017 1214   HGB 14.3 05/01/2019 1331   HCT 42.0 05/01/2019 1331   PLT 370 10/26/2017 1214   MCV 91.8 10/26/2017 1214   MCH 29.7 10/26/2017 1214   MCHC 32.3 10/26/2017 1214   RDW 12.8 10/26/2017 1214   LYMPHSABS 3.5 09/09/2017 1706   MONOABS 0.8 09/09/2017 1706   EOSABS 0.4 09/09/2017 1706   BASOSABS 0.1 09/09/2017 1706    No results found for: "POCLITH", "LITHIUM"   No results found for: "PHENYTOIN", "PHENOBARB", "VALPROATE", "CBMZ"   .res Assessment: Plan:    Treatment Plan/Recommendations:   Prozac 40mg  daily Wellbutrin XL 150mg  daily  Decrease - Xanax 0.25mg  TID to 1/2 tablet TID. Will continue to taper down  from there until next appointment - may use as needed moving forward.  RTC 6 weeks  25 minutes spent dedicated to the care of this patient on the date of this encounter to include pre-visit review of records, ordering of medication, post visit documentation, and face-to-face time with the patient discussing GAD. Discussed medication changes with patient - will plan to reduce Xanax dose with anxiety improving.    Discussed potential benefits, risk, and side effects of benzodiazepines to include potential risk of tolerance and dependence, as well as possible drowsiness.  Advised patient not to drive if experiencing drowsiness and to take lowest possible effective dose to minimize risk of dependence and tolerance.   Diagnoses and all orders for this visit:  Generalized anxiety disorder -     ALPRAZolam (XANAX) 0.25 MG tablet; Take 1/2 tablet three times daily.     Please see After Visit Summary for patient specific instructions.  Future Appointments  Date Time Provider Department Center  09/01/2023 10:00 AM Mathis Fare, LCSW [facility] None    No orders of the defined types were placed in this encounter.     -------------------------------

## 2023-08-25 ENCOUNTER — Other Ambulatory Visit: Payer: Self-pay | Admitting: Cardiology

## 2023-08-25 DIAGNOSIS — I480 Paroxysmal atrial fibrillation: Secondary | ICD-10-CM

## 2023-08-25 NOTE — Telephone Encounter (Signed)
 Prescription refill request for Eliquis received. Indication:afib Last office visit:2/25 Scr:0.73  3/25 Age: 73 Weight:63  kg  Prescription refilled

## 2023-08-26 ENCOUNTER — Telehealth (HOSPITAL_COMMUNITY): Payer: Self-pay

## 2023-08-26 NOTE — Telephone Encounter (Signed)
 Call placed to patient to discuss upcoming procedure.   CT: completed.  Labs: completed.   Any recent signs of acute illness or been started on antibiotics? No Any new medications started? No Any medications to hold? No Any missed doses of blood thinner? No  Advised patient to continue taking ANTICOAGULANT: Eliquis (Apixaban) without missing any doses.  Medication instructions:  On the morning of your procedure DO NOT take any medication., including Eliquis or the procedure may be rescheduled. Nothing to eat or drink after midnight prior to your procedure.  Confirmed patient is scheduled for Atrial Fibrillation Ablation on Monday, April 10 with Dr. Loman Brooklyn. Instructed patient to arrive at the Main Entrance A at [facility]: [address] [place], [place] [address] and check in at Admitting at 5:30 AM.  Advised of plan to go home the same day and will only stay overnight if medically necessary. You MUST have a responsible adult to drive you home and MUST be with you the first 24 hours after you arrive home or your procedure could be cancelled.  Patient verbalized understanding to all instructions provided and agreed to proceed with procedure.

## 2023-08-30 ENCOUNTER — Telehealth: Payer: Self-pay | Admitting: Cardiology

## 2023-08-30 NOTE — Telephone Encounter (Signed)
 The pt called to report that with her seasonal allergies she has had a lot of coughing and post nasal mucus... she denies fever, mucus is clear..... she will hydrate well over the next 2 days prior to her planned 09/02/23 ablation and will withhold using her "nasal wash" that morning due to risk of swallowing the large amount of water being used.. I will alert Sherri in case of any other possible issues or if the pt calls back that her symptoms have worsened or changed.

## 2023-08-30 NOTE — Telephone Encounter (Signed)
 Patient is requesting to speak with RN Sherri in regard to her upcoming procedure. Please advise.

## 2023-09-01 ENCOUNTER — Ambulatory Visit: Payer: Self-pay | Admitting: Psychiatry

## 2023-09-01 DIAGNOSIS — F411 Generalized anxiety disorder: Secondary | ICD-10-CM

## 2023-09-01 MED ORDER — SODIUM CHLORIDE 0.9 % IV SOLN: INTRAVENOUS | Status: DC

## 2023-09-01 NOTE — Pre-Procedure Instructions (Signed)
Instructed patient on the following items: Arrival time 0515 Nothing to eat or drink after midnight No meds AM of procedure Responsible person to drive you home and stay with you for 24 hrs  Have you missed any doses of anti-coagulant Eliquis- takes twice a day, hasn't missed any doses.  Don't take morning of procedure.

## 2023-09-01 NOTE — Progress Notes (Signed)
 [facility] Counselor/Therapist Progress Note  Patient ID: Leah Reeves, MRN: [id number],    Date: 09/01/2023  Time Spent: 50 minutes   Treatment Type: Individual Therapy  Reported Symptoms: anxiety (improving), less napping during the day, "shakiness decreasing", remains on her meds, less focus on her anxiety and problems, getting out with husband more, trying to decrease her tendency to be a people-pleaser, increased interactions with other people   Mental Status Exam:  Appearance:   Well Groomed     Behavior:  Appropriate, Sharing, and Motivated  Motor:  Normal  Speech/Language:   Normal Rate  Affect:  anxiety  Mood:  anxious  Thought process:  goal directed  Thought content:    WNL  Sensory/Perceptual disturbances:    WNL  Orientation:  oriented to person, place, time/date, situation, day of week, month of year, year, and stated date of September 01, 2023  Attention:  Good  Concentration:  Good  Memory:  WNL  Fund of knowledge:   Good  Insight:    Good  Judgment:   Good  Impulse Control:  Good   Risk Assessment: Danger to Self:  No Self-injurious Behavior: No Danger to Others: No Duty to Warn:no Physical Aggression / Violence:No  Access to Firearms a concern: No  Gang Involvement:No   Subjective:    Patient working today in session and continues to be motivated, pleasant, some fear, and making some progress in her anxiety. Is getting busier in getting some plants to have at home and this is enjoyable for patient. Shares that she notices that she is paying attention better. Some less anxious and stressed although having some stress re: her cardiac related outpatient procedure scheduled for tomorrow.  She reports that her doctor is very encouraging about it and feels that there will not be any problems with it and patient able to return home same day.  Continues to note some progress with her anxiety even in spite of this outpatient surgical procedure being done  tomorrow.  Says that she is not really feeling the lingering shakiness in the mornings like she had been and that "right now it is hard to tell because she is a little nervous about her procedure tomorrow".  Talked through her anxieties about this procedure which seemed to help her today.  Does want to work further on her tendency to be a "people pleaser" keeping everybody else happy and satisfied and often overlooking some of her own needs and wants, including sometimes not having good boundaries.  We agreed that we would talk further about this at her next visit once this procedure is over with tomorrow.  Continues to be talking more openly today and seems to be feeling more comfortable with herself and the issues that she is working on, already sensing some progress and a better comfort level in addressing these issues.  Interventions: Cognitive Behavioral Therapy and Ego-Supportive 1.Reduce overall level, frequency, and intensity of the anxiety so that daily functioning is not impaired. 2.Verbalize an understanding of the role that fearful thinking plays in creating fears, excessive worry, and persistent anxiety symptoms. 3.Develop behavioral and cognitive stategies to reduce or eliminate the irrational anxiety.    Diagnosis:   ICD-10-CM   1. Generalized anxiety disorder  F41.1       Plan:   Patient today in session showing good motivation and continued work on her goals related to anxiety, negative assumptions and trying to decrease them, and continued work in trying to refrain  from "assuming the worst".  Sees come into therapy as a forward step for her and will follow-up with patient again after her outpatient procedure tomorrow as noted above.  Goal review and progress/challenges noted with patient.  Next appointment within 2 to 3 weeks.   Mathis Fare, LCSW

## 2023-09-01 NOTE — Anesthesia Preprocedure Evaluation (Signed)
 Anesthesia Evaluation  Patient identified by MRN, date of birth, ID band Patient awake    Reviewed: Allergy & Precautions, NPO status , Patient's Chart, lab work & pertinent test results  History of Anesthesia Complications Negative for: history of anesthetic complications  Airway Mallampati: II  TM Distance: >3 FB Neck ROM: Full    Dental  (+) Dental Advisory Given   Pulmonary neg shortness of breath, asthma (childhood) , neg sleep apnea, neg COPD, neg recent URI, former smoker   breath sounds clear to auscultation       Cardiovascular (-) hypertension(-) angina (-) Past MI, (-) Cardiac Stents and (-) CABG + dysrhythmias (on metoprolol and diltiazem) Atrial Fibrillation + Valvular Problems/Murmurs (mild) MR  Rhythm:Regular Rate:Normal  HLD  Low-risk stress test 07/29/2022  TTE 06/25/2022: IMPRESSIONS    1. Left ventricular ejection fraction, by estimation, is 55 to 60%. The  left ventricle has normal function. The left ventricle has no regional  wall motion abnormalities. Left ventricular diastolic parameters were  normal.   2. Right ventricular systolic function is normal. The right ventricular  size is normal. There is normal pulmonary artery systolic pressure.   3. Left atrial size was mildly dilated.   4. The mitral valve is abnormal. Mild mitral valve regurgitation. No  evidence of mitral stenosis.   5. The aortic valve is tricuspid. Aortic valve regurgitation is not  visualized. No aortic stenosis is present.   6. The inferior vena cava is normal in size with greater than 50%  respiratory variability, suggesting right atrial pressure of 3 mmHg.     Neuro/Psych  Headaches, neg Seizures PSYCHIATRIC DISORDERS Anxiety Depression    Vertigo     GI/Hepatic negative GI ROS, Neg liver ROS,,,  Endo/Other  negative endocrine ROS    Renal/GU negative Renal ROS     Musculoskeletal  (+) Arthritis ,  osteopenia    Abdominal   Peds  Hematology negative hematology ROS (+)   Anesthesia Other Findings Last Eliquis: last night  Reproductive/Obstetrics                              Anesthesia Physical Anesthesia Plan  ASA: 2  Anesthesia Plan: General   Post-op Pain Management: Tylenol PO (pre-op)*   Induction: Intravenous  PONV Risk Score and Plan: 3 and Ondansetron, Dexamethasone and Treatment may vary due to age or medical condition  Airway Management Planned: Oral ETT  Additional Equipment:   Intra-op Plan:   Post-operative Plan: Extubation in OR  Informed Consent: I have reviewed the patients History and Physical, chart, labs and discussed the procedure including the risks, benefits and alternatives for the proposed anesthesia with the patient or authorized representative who has indicated his/her understanding and acceptance.     Dental advisory given  Plan Discussed with: CRNA and Anesthesiologist  Anesthesia Plan Comments: (Risks of general anesthesia discussed including, but not limited to, sore throat, hoarse voice, chipped/damaged teeth, injury to vocal cords, nausea and vomiting, allergic reactions, lung infection, heart attack, stroke, and death. All questions answered. )        Anesthesia Quick Evaluation

## 2023-09-02 ENCOUNTER — Telehealth: Payer: Self-pay | Admitting: Psychiatry

## 2023-09-02 ENCOUNTER — Encounter (HOSPITAL_COMMUNITY)
Admission: RE | Disposition: A | Discharge status: Home / Self Care | Payer: Self-pay | Source: Home / Self Care | Attending: Cardiology

## 2023-09-02 ENCOUNTER — Ambulatory Visit (HOSPITAL_COMMUNITY)
Admission: RE | Admit: 2023-09-02 | Discharge: 2023-09-02 | Disposition: A | Discharge status: Home / Self Care | Payer: Medicare Other | Attending: Cardiology | Admitting: Cardiology

## 2023-09-02 ENCOUNTER — Other Ambulatory Visit: Payer: Self-pay

## 2023-09-02 ENCOUNTER — Ambulatory Visit (HOSPITAL_COMMUNITY): Admitting: Anesthesiology

## 2023-09-02 ENCOUNTER — Ambulatory Visit (HOSPITAL_BASED_OUTPATIENT_CLINIC_OR_DEPARTMENT_OTHER): Admitting: Anesthesiology

## 2023-09-02 DIAGNOSIS — E785 Hyperlipidemia, unspecified: Secondary | ICD-10-CM | POA: Insufficient documentation

## 2023-09-02 DIAGNOSIS — I4891 Unspecified atrial fibrillation: Secondary | ICD-10-CM

## 2023-09-02 DIAGNOSIS — I480 Paroxysmal atrial fibrillation: Secondary | ICD-10-CM

## 2023-09-02 DIAGNOSIS — J45909 Unspecified asthma, uncomplicated: Secondary | ICD-10-CM | POA: Diagnosis not present

## 2023-09-02 HISTORY — PX: ATRIAL FIBRILLATION ABLATION: EP1191

## 2023-09-02 LAB — POCT ACTIVATED CLOTTING TIME: Activated Clotting Time: 285 s

## 2023-09-02 SURGERY — ATRIAL FIBRILLATION ABLATION
Anesthesia: General

## 2023-09-02 MED ORDER — ONDANSETRON HCL 4 MG/2ML IJ SOLN
INTRAMUSCULAR | Status: DC | PRN
  Administered 2023-09-02: 4 mg via INTRAVENOUS

## 2023-09-02 MED ORDER — FENTANYL CITRATE (PF) 250 MCG/5ML IJ SOLN
INTRAMUSCULAR | Status: DC | PRN
  Administered 2023-09-02: 100 ug via INTRAVENOUS

## 2023-09-02 MED ORDER — PHENYLEPHRINE HCL-NACL 20-0.9 MG/250ML-% IV SOLN
INTRAVENOUS | Status: DC | PRN
  Administered 2023-09-02: 25 ug/min via INTRAVENOUS

## 2023-09-02 MED ORDER — ONDANSETRON HCL 4 MG/2ML IJ SOLN
4.0000 mg | Freq: Four times a day (QID) | INTRAMUSCULAR | Status: DC | PRN
Start: 2023-09-02 — End: 2023-09-02

## 2023-09-02 MED ORDER — ACETAMINOPHEN 325 MG PO TABS: 650.0000 mg | ORAL_TABLET | ORAL | Status: DC | PRN

## 2023-09-02 MED ORDER — SUGAMMADEX SODIUM 200 MG/2ML IV SOLN
INTRAVENOUS | Status: DC | PRN
  Administered 2023-09-02: 200 mg via INTRAVENOUS

## 2023-09-02 MED ORDER — PROTAMINE SULFATE 10 MG/ML IV SOLN
INTRAVENOUS | Status: DC | PRN
Start: 2023-09-02 — End: 2023-09-02
  Administered 2023-09-02: 40 mg via INTRAVENOUS

## 2023-09-02 MED ORDER — ROCURONIUM BROMIDE 10 MG/ML (PF) SYRINGE
PREFILLED_SYRINGE | INTRAVENOUS | Status: DC | PRN
  Administered 2023-09-02: 60 mg via INTRAVENOUS

## 2023-09-02 MED ORDER — ATROPINE SULFATE 1 MG/ML IV SOLN
INTRAVENOUS | Status: DC | PRN
  Administered 2023-09-02: 1 mg via INTRAVENOUS

## 2023-09-02 MED ORDER — HEPARIN (PORCINE) IN NACL 1000-0.9 UT/500ML-% IV SOLN
INTRAVENOUS | Status: DC | PRN
  Administered 2023-09-02 (×3): 500 mL

## 2023-09-02 MED ORDER — DEXAMETHASONE SODIUM PHOSPHATE 10 MG/ML IJ SOLN
INTRAMUSCULAR | Status: DC | PRN
Start: 2023-09-02 — End: 2023-09-02
  Administered 2023-09-02: 10 mg via INTRAVENOUS

## 2023-09-02 MED ORDER — SODIUM CHLORIDE 0.9 % IV SOLN: INTRAVENOUS | Status: DC | PRN

## 2023-09-02 MED ORDER — ACETAMINOPHEN 500 MG PO TABS
1000.0000 mg | ORAL_TABLET | Freq: Once | ORAL | Status: AC
  Administered 2023-09-02: 1000 mg via ORAL
  Filled 2023-09-02: qty 2

## 2023-09-02 MED ORDER — HEPARIN SODIUM (PORCINE) 1000 UNIT/ML IJ SOLN
INTRAMUSCULAR | Status: DC | PRN
  Administered 2023-09-02: 4000 [IU] via INTRAVENOUS
  Administered 2023-09-02: 14000 [IU] via INTRAVENOUS

## 2023-09-02 MED ORDER — PHENYLEPHRINE 80 MCG/ML (10ML) SYRINGE FOR IV PUSH (FOR BLOOD PRESSURE SUPPORT)
PREFILLED_SYRINGE | INTRAVENOUS | Status: DC | PRN
  Administered 2023-09-02: 80 ug via INTRAVENOUS

## 2023-09-02 MED ORDER — PROPOFOL 10 MG/ML IV BOLUS
INTRAVENOUS | Status: DC | PRN
  Administered 2023-09-02: 120 mg via INTRAVENOUS

## 2023-09-02 MED ORDER — EPHEDRINE SULFATE-NACL 50-0.9 MG/10ML-% IV SOSY
PREFILLED_SYRINGE | INTRAVENOUS | Status: DC | PRN
  Administered 2023-09-02: 5 mg via INTRAVENOUS

## 2023-09-02 MED ORDER — LIDOCAINE 2% (20 MG/ML) 5 ML SYRINGE
INTRAMUSCULAR | Status: DC | PRN
  Administered 2023-09-02: 60 mg via INTRAVENOUS

## 2023-09-02 MED ORDER — FENTANYL CITRATE (PF) 100 MCG/2ML IJ SOLN
INTRAMUSCULAR | Status: AC
  Filled 2023-09-02: qty 2

## 2023-09-02 SURGICAL SUPPLY — 21 items
BAG SNAP BAND KOVER 36X36 (MISCELLANEOUS) IMPLANT
CABLE PFA RX CATH CONN (CABLE) IMPLANT
CATH 8FR REPROCESSED SOUNDSTAR (CATHETERS) ×1 IMPLANT
CATH 8FR SOUNDSTAR REPROCESSED (CATHETERS) IMPLANT
CATH FARAWAVE ABLATION 31 (CATHETERS) IMPLANT
CATH OCTARAY 2.0 F 3-3-3-3-3 (CATHETERS) IMPLANT
CATH WEBSTER BI DIR CS D-F CRV (CATHETERS) IMPLANT
CLOSURE MYNX CONTROL 6F/7F (Vascular Products) IMPLANT
CLOSURE PERCLOSE PROSTYLE (VASCULAR PRODUCTS) IMPLANT
COVER SWIFTLINK CONNECTOR (BAG) ×1 IMPLANT
DILATOR VESSEL 38 20CM 16FR (INTRODUCER) IMPLANT
GUIDEWIRE INQWIRE 1.5J.035X260 (WIRE) IMPLANT
INQWIRE 1.5J .035X260CM (WIRE) ×1 IMPLANT
KIT VERSACROSS CNCT FARADRIVE (KITS) IMPLANT
PACK EP LF (CUSTOM PROCEDURE TRAY) ×1 IMPLANT
PAD DEFIB RADIO PHYSIO CONN (PAD) ×1 IMPLANT
PATCH CARTO3 (PAD) IMPLANT
SHEATH FARADRIVE STEERABLE (SHEATH) IMPLANT
SHEATH PINNACLE 8F 10CM (SHEATH) IMPLANT
SHEATH PINNACLE 9F 10CM (SHEATH) IMPLANT
SHEATH PROBE COVER 6X72 (BAG) IMPLANT

## 2023-09-02 NOTE — Anesthesia Postprocedure Evaluation (Signed)
 Anesthesia Post Note  Patient: Leah Reeves  Procedure(s) Performed: ATRIAL FIBRILLATION ABLATION     Patient location during evaluation: PACU Anesthesia Type: General Level of consciousness: awake and alert Pain management: pain level controlled Vital Signs Assessment: post-procedure vital signs reviewed and stable Respiratory status: spontaneous breathing, nonlabored ventilation, respiratory function stable and patient connected to nasal cannula oxygen Cardiovascular status: blood pressure returned to baseline and stable Postop Assessment: no apparent nausea or vomiting Anesthetic complications: no   There were no known notable events for this encounter.  Last Vitals:  Vitals:   09/02/23 1100 09/02/23 1130  BP: (!) 95/45 (!) 99/55  Pulse: (!) 53 (!) 50  Resp: 20 15  Temp:    SpO2: 93% 94%    Last Pain:  Vitals:   09/02/23 0956  TempSrc:   PainSc: 0-No pain                 [place]

## 2023-09-02 NOTE — Anesthesia Procedure Notes (Signed)
 Procedure Name: Intubation Date/Time: 09/02/2023 7:54 AM  Performed by: Venia Carbon, CRNAPre-anesthesia Checklist: Patient identified, Emergency Drugs available, Suction available, Patient being monitored and Timeout performed Patient Re-evaluated:Patient Re-evaluated prior to induction Oxygen Delivery Method: Circle system utilized Induction Type: IV induction Ventilation: Mask ventilation without difficulty Laryngoscope Size: Mac and 3 Grade View: Grade I Tube type: Oral Tube size: 7.0 mm Number of attempts: 1 Airway Equipment and Method: Stylet and Patient positioned with wedge pillow Placement Confirmation: ETT inserted through vocal cords under direct vision, positive ETCO2, CO2 detector and breath sounds checked- equal and bilateral Secured at: 22 cm Tube secured with: Tape

## 2023-09-02 NOTE — Discharge Instructions (Signed)

## 2023-09-02 NOTE — Transfer of Care (Signed)
 Immediate Anesthesia Transfer of Care Note  Patient: Leah Reeves  Procedure(s) Performed: ATRIAL FIBRILLATION ABLATION  Patient Location: Cath Lab  Anesthesia Type:General  Level of Consciousness: awake, alert , oriented, and patient cooperative  Airway & Oxygen Therapy: Patient Spontanous Breathing and Patient connected to face mask oxygen  Post-op Assessment: Report given to RN, Post -op Vital signs reviewed and stable, Patient moving all extremities, Patient moving all extremities X 4, and Patient able to stick tongue midline  Post vital signs: Reviewed and stable  Last Vitals:  Vitals Value Taken Time  BP 104/51 09/02/23 0903  Temp    Pulse 58 09/02/23 0906  Resp 25 09/02/23 0906  SpO2 98 % 09/02/23 0906  Vitals shown include unfiled device data.  Last Pain:  Vitals:   09/02/23 0552  PainSc: 0-No pain         Complications: There were no known notable events for this encounter.

## 2023-09-02 NOTE — Telephone Encounter (Signed)
 Leah Reeves called stating that she had her heart abrasion today and it went very well. Her number is [phone].Just wanted to let you know.

## 2023-09-02 NOTE — H&P (Signed)
  Electrophysiology Office Note:   Date:  09/02/2023  ID:  Leah Reeves, DOB October 28, 1950, MRN [id number]  Primary Cardiologist: Leah Schultz, MD Primary Heart Failure: None Electrophysiologist: Leah Common Jorja Loa, MD      History of Present Illness:   Leah Reeves is a 73 y.o. female with h/o hyperlipidemia, atrial fibrillation seen today for routine electrophysiology followup.   Today, denies symptoms of palpitations, chest pain, shortness of breath, orthopnea, PND, lower extremity edema, claudication, dizziness, presyncope, syncope, bleeding, or neurologic sequela. The patient is tolerating medications without difficulties. Plan ablation today.   EP Information / Studies Reviewed:    EKG is not ordered today. EKG from 06/27/22 reviewed which showed sinus rhythm        Risk Assessment/Calculations:    CHA2DS2-VASc Score = 3   This indicates a 3.2% annual risk of stroke. The patient's score is based upon: CHF History: 0 HTN History: 0 Diabetes History: 0 Stroke History: 0 Vascular Disease History: 1 (aortic atherosclerosis) Age Score: 1 Gender Score: 1             Physical Exam:   VS:  BP (!) 136/54   Pulse (!) 52   Temp 98.2 F (36.8 C)   Resp 16   Ht 5\' 4"  (1.626 m)   Wt 65.3 kg   LMP 05/25/2008   SpO2 97%   BMI 24.72 kg/m    Wt Readings from Last 3 Encounters:  09/02/23 65.3 kg  07/12/23 63 kg  07/12/23 64 kg    GEN: No acute distress.   Neck: No JVD Cardiac: RRR, no murmurs, rubs, or gallops.  Respiratory: decreased BS bases bilaterally. GI: Soft, nontender, non-distended  MS: No edema; No deformity. Neuro:  Nonfocal  Skin: warm and dry Psych: Normal affect    ASSESSMENT AND PLAN:    1.  Paroxysmal atrial fibrillation: Leah Reeves has presented today for surgery, with the diagnosis of AF.  The various methods of treatment have been discussed with the patient and family. After consideration of risks, benefits and other options for  treatment, the patient has consented to  Procedure(s): Catheter ablation as a surgical intervention .  Risks include but not limited to complete heart block, stroke, esophageal damage, nerve damage, bleeding, vascular damage, tamponade, perforation, MI, and death. The patient's history has been reviewed, patient examined, no change in status, stable for surgery.  I have reviewed the patient's chart and labs.  Questions were answered to the patient's satisfaction.    Finesse Fielder Elberta Fortis, MD 09/02/2023 7:12 AM

## 2023-09-03 ENCOUNTER — Telehealth (HOSPITAL_COMMUNITY): Payer: Self-pay

## 2023-09-03 ENCOUNTER — Encounter (HOSPITAL_COMMUNITY): Payer: Self-pay | Admitting: Cardiology

## 2023-09-03 MED FILL — Fentanyl Citrate Preservative Free (PF) Inj 100 MCG/2ML: INTRAMUSCULAR | Qty: 2 | Status: AC

## 2023-09-03 NOTE — Telephone Encounter (Signed)
 Spoke with patient to complete post procedure follow up call.  Patient reports no complications with groin sites.   Instructions reviewed with patient:  Remove large bandage at puncture site after 24 hours. It is normal to have bruising, tenderness and a pea or marble sized lump/knot at the groin site which can take up to three months to resolve.  Get help right away if you notice sudden swelling at the puncture site.  Check your puncture site every day for signs of infection: fever, redness, swelling, pus drainage, warmth, foul odor or excessive pain. If this occurs, please call the office at [phone], to speak with the nurse. Get help right away if your puncture site is bleeding and the bleeding does not stop after applying firm pressure to the area.  You may continue to have skipped beats/ atrial fibrillation during the first several months after your procedure.  It is very important not to miss any doses of your blood thinner Eliquis. Patient restarted taking this medication on yesterday.   You will follow up with the Afib clinic on 09/30/23 and follow up with the APP on 12/01/23.   Patient verbalized understanding to all instructions provided.

## 2023-09-06 ENCOUNTER — Telehealth: Payer: Self-pay | Admitting: Cardiology

## 2023-09-06 MED ORDER — ACETAMINOPHEN 500 MG PO TABS: 500.0000 mg | ORAL_TABLET | Freq: Three times a day (TID) | ORAL | Status: AC | PRN

## 2023-09-06 NOTE — Telephone Encounter (Signed)
   Pt c/o of Chest Pain: STAT if active CP, including tightness, pressure, jaw pain, radiating pain to shoulder/upper arm/back, CP unrelieved by Nitro. Symptoms reported of SOB, nausea, vomiting, sweating.  1. Are you having CP right now? Yes chest tightness     2. Are you experiencing any other symptoms (ex. SOB, nausea, vomiting, sweating)? Headache    3. Is your CP continuous or coming and going? Coming and going    4. Have you taken Nitroglycerin? No, but wants to know if she can take Tylenol for it.    5. How long have you been experiencing CP? Since last night     6. If NO CP at time of call then end call with telling Pt to call back or call 911 if Chest pain returns prior to return call from triage team.

## 2023-09-06 NOTE — Telephone Encounter (Signed)
 Called and spoke to patient. Patient called office c/o of chest soreness, denies sob, headaches or dizziness. Patient  request tylenol 500 mg as needed.  per Dr. Lawana Pray patient can take tylenol 500 mg every 8 hrs for mild pain.Understanding verbalized

## 2023-09-15 ENCOUNTER — Ambulatory Visit (INDEPENDENT_AMBULATORY_CARE_PROVIDER_SITE_OTHER): Admitting: Psychiatry

## 2023-09-15 DIAGNOSIS — F411 Generalized anxiety disorder: Secondary | ICD-10-CM

## 2023-09-15 NOTE — Progress Notes (Signed)
      [facility] Counselor/Therapist Progress Note  Patient ID: Leah Reeves, MRN: [id number],    Date: 09/15/2023  Time Spent: 55 minutes   Treatment Type: Individual Therapy  Reported Symptoms:    Anxiety (improving), shakiness decreasing, remains on her medications as prescribed, less focused on her anxiety and "problems", getting out with husband more, trying to decrease her tendency to be a people-pleaser   Mental Status Exam:  Appearance:   Casual     Behavior:  Appropriate, Sharing, and Motivated  Motor:  Normal  Speech/Language:   Clear and Coherent  Affect:  Anxious, some depression but improving  Mood:  anxious  Thought process:  goal directed  Thought content:    Rumination  Sensory/Perceptual disturbances:    WNL  Orientation:  oriented to person, place, time/date, situation, day of week, month of year, year, and stated date of September 15, 2023  Attention:  Good  Concentration:  Good  Memory:  WNL  Fund of knowledge:   Good  Insight:    Good  Judgment:   Good  Impulse Control:  Good   Risk Assessment: Danger to Self:  No Self-injurious Behavior: No Danger to Others: No Duty to Warn:no Physical Aggression / Violence:No  Access to Firearms a concern: No  Gang Involvement:No   Subjective:   Patient today in session following up with her symptoms of wanting to be less fearful, more motivated, and reduce her anxiety.  Is intentionally getting out with husband more and being around more people more often and so far this is going "ok". Working further today on her anxiety and better managing it. Talking more freely today and seems to have more confidence in herself that is gradually increasing. Thankful that her heart procedure went well recently and not worrying as much about that part of her health. Shakiness in the mornings is decreasing gradually. Reports that talking through her anxieties and how to better manage it, "is really helping and I'm gradually  improving."  Jitteriness decreasing. Working more today on her habit of people-pleasing and trying to keep everybody happy, while realizing her own needs and wants and that it's ok to pay more attention to herself at times. To work further on setting boundaries with others and realizing some more the need to have personal boundaries. Continues to sense some of her progress and feeling good about it.   Interventions: Cognitive Behavioral Therapy and Ego-Supportive 1.Reduce overall level, frequency, and intensity of the anxiety so that daily functioning is not impaired. 2.Verbalize an understanding of the role that fearful thinking plays in creating fears, excessive worry, and persistent anxiety symptoms. 3.Develop behavioral and cognitive stategies to reduce or eliminate the irrational anxiety.     Diagnosis:   ICD-10-CM   1. Generalized anxiety disorder  F41.1      Plan: Patient working well in session today, remaining goal focused and intentional and making certain changes that she notices are helping her anxiety, her self-esteem and negative assumptions, and is helping her to work on not assuming the worst case scenarios.  Very relieved that her heart procedure went well recently and has not had any negative effects from that.  Goal review and progress/challenges noted with patient.  Next appointment within 2 to 3 weeks.   [name], LCSW

## 2023-09-29 ENCOUNTER — Ambulatory Visit: Admitting: Psychiatry

## 2023-09-29 DIAGNOSIS — F411 Generalized anxiety disorder: Secondary | ICD-10-CM | POA: Diagnosis not present

## 2023-09-29 NOTE — Progress Notes (Signed)
      [facility] Counselor/Therapist Progress Note  Patient ID: Leah Reeves, MRN: [id number],    Date: 09/29/2023  Time Spent: 50 minutes   Treatment Type: Individual Therapy  Reported Symptoms: Anxiety much better, remains on medications, less focused on her anxiety   Mental Status Exam:  Appearance:   Casual and Neat     Behavior:  Appropriate, Sharing, and Motivated  Motor:  Normal  Speech/Language:   Clear and Coherent  Affect:  anxious  Mood:  anxious  Thought process:  goal directed  Thought content:    WNL  Sensory/Perceptual disturbances:    WNL  Orientation:  oriented to person, place, time/date, situation, day of week, month of year, year, and stated date of Sep 29, 2023  Attention:  Good  Concentration:  Good  Memory:  WNL  Fund of knowledge:   Good  Insight:    Good  Judgment:   Good  Impulse Control:  Good   Risk Assessment: Danger to Self:  No Self-injurious Behavior: No Danger to Others: No Duty to Warn:no Physical Aggression / Violence:No  Access to Firearms a concern: No  Gang Involvement:No   Subjective:   Patient very pleasant today and interacting well in session feeling more encouraged as she sees her progress. Less fearful, less anxious, being around people more, working to decrease her worrying and is making progress. Husband very supportive and patient showing good motivation and follow through. Still doing well with her heart ablation. Self-confidence increasing. Less worrying about herself and her health. Decreased people-pleasing. Focusing more on positive vs negatives. Working to have healthier boundaries.  Sensing more of her progress and feeling good about it.  Mood improving as she makes progress on her goals.  Will see patient again within approximately 3 weeks.   Interventions: Cognitive Behavioral Therapy and Ego-Supportive 1.Reduce overall level, frequency, and intensity of the anxiety so that daily functioning is not  impaired. 2.Verbalize an understanding of the role that fearful thinking plays in creating fears, excessive worry, and persistent anxiety symptoms. 3.Develop behavioral and cognitive stategies to reduce or eliminate the irrational anxiety.   Diagnosis:   ICD-10-CM   1. Generalized anxiety disorder  F41.1      Plan:  Patient feels she is doing better in controlling "what I will and will not let disrupt me." Rates her current anxiety about a  "1" on a 1-10 scale. Self-esteem is a work in progress and is noticeably better. Feels she is working to be more positive vs negative, looking more for what might "go right versus go wrong.  Improved confidence and feelings about herself.  Still some nervousness but also noticing her ability to relax more today.  Less self-doubt.  Less assuming of worst-case scenarios.  Improved self-esteem.  Less focused on negatives.  [name] is showing progress and needs to continue her work with goal-directed behaviors to keep moving and a more positive and healthier direction.  Goal review and progress/challenges noted with patient.  Next appointment within 2 to 3 weeks.   [name], LCSW

## 2023-09-30 ENCOUNTER — Ambulatory Visit (HOSPITAL_COMMUNITY)
Admission: RE | Admit: 2023-09-30 | Discharge: 2023-09-30 | Disposition: A | Discharge status: Home / Self Care | Source: Ambulatory Visit | Attending: Physician Assistant | Admitting: Physician Assistant

## 2023-09-30 ENCOUNTER — Encounter (HOSPITAL_COMMUNITY): Payer: Self-pay | Admitting: Physician Assistant

## 2023-09-30 VITALS — BP 128/60 | HR 69 | Ht 64.0 in | Wt 148.4 lb

## 2023-09-30 DIAGNOSIS — Z79899 Other long term (current) drug therapy: Secondary | ICD-10-CM

## 2023-09-30 DIAGNOSIS — Z5181 Encounter for therapeutic drug level monitoring: Secondary | ICD-10-CM | POA: Diagnosis not present

## 2023-09-30 DIAGNOSIS — D6869 Other thrombophilia: Secondary | ICD-10-CM

## 2023-09-30 DIAGNOSIS — I480 Paroxysmal atrial fibrillation: Secondary | ICD-10-CM | POA: Diagnosis not present

## 2023-09-30 NOTE — Progress Notes (Signed)
 Primary Care Physician: Arva Lathe, MD Primary Cardiologist: Dr Renna Cary Primary Electrophysiologist: Dr Lawana Pray Referring Physician: Dr Jacalyn Martin is a 73 y.o. female with a history of HLD, aortic atherosclerosis, and atrial fibrillation who presents for follow up in the [facility]. She presented to the emergency room on 10/26/2017 with atrial fibrillation and rapid rates. She had an episode the day prior that following a panic attack when she got bad news. Patient is on Eliquis  for stroke prevention.   She had done very well with no afib until 04/25/22 when she woke with fatigue and palpitations. She checked her smart watch which showed afib with heart rates 100-120 bpm. She took PRN diltiazem  and the episode resolved in about 4 hours.   She had a cardiac PET scan 07/29/22 which was low risk for ischemia. She was started on flecainide . A cardiac monitor was placed 07/2022 which showed 7.3% PVC burden.   Patient returns for follow up for atrial fibrillation and flecainide  monitoring. Patient is s/p afib ablation with Dr Lawana Pray on 09/02/23. She reports that she has done well since her procedure. Her smart watch has not alerted for any afib episodes. She denies chest pain or groin issues.   Today, she  denies symptoms of palpitations, chest pain, shortness of breath, orthopnea, PND, lower extremity edema, dizziness, presyncope, syncope, snoring, daytime somnolence, bleeding, or neurologic sequela. The patient is tolerating medications without difficulties and is otherwise without complaint today.    Atrial Fibrillation Risk Factors:  she does not have symptoms or diagnosis of sleep apnea. she does not have a history of rheumatic fever. she does not have a history of alcohol use.   Atrial Fibrillation Management history:  Previous antiarrhythmic drugs: flecainide   Previous cardioversions: none Previous ablations:  09/02/23 Anticoagulation history: Eliquis    Past Medical History:  Diagnosis Date   Anxiety    Arthritis    knees -generalized   Asthma    Atrial fibrillation (HCC)    Bronchitis    Depression    Headache    past history - none at present   Heart murmur    Osteopenia    Perimenopausal vasomotor symptoms    Vertigo    tx. 6 months ago- no problems now,very mild occ.    Current Outpatient Medications  Medication Sig Dispense Refill   acetaminophen  (TYLENOL ) 500 MG tablet Take 1 tablet (500 mg total) by mouth every 8 (eight) hours as needed.     ALPRAZolam  (XANAX ) 0.25 MG tablet Take 1/2 tablet three times daily. 90 tablet 0   Ascorbic Acid (VITAMIN C) 500 MG CHEW Chew 500 mg by mouth daily.     buPROPion (WELLBUTRIN XL) 150 MG 24 hr tablet Take 150 mg by mouth every morning.     cholecalciferol (VITAMIN D) 1000 units tablet Take 1,000 Units by mouth daily.     clobetasol  ointment (TEMOVATE ) 0.05 % Apply 1 Application topically 2 (two) times a week. Thin application on affected vulva. 30 g 4   diltiazem  (CARDIZEM ) 30 MG tablet Take 1 tablet (30 mg total) by mouth 4 (four) times daily as needed. 30 tablet 1   ELIQUIS  5 MG TABS tablet TAKE 1 TABLET BY MOUTH TWICE  DAILY 200 tablet 2   flecainide  (TAMBOCOR ) 50 MG tablet TAKE 1 TABLET BY MOUTH TWICE A DAY 180 tablet 3   FLUoxetine (PROZAC) 40 MG capsule Take 40 mg by mouth every morning.  metoprolol  succinate (TOPROL  XL) 25 MG 24 hr tablet Take 0.5 tablets (12.5 mg total) by mouth daily. 45 tablet 3   nitroGLYCERIN  (NITROSTAT ) 0.4 MG SL tablet Place 0.4 mg under the tongue every 5 (five) minutes as needed for chest pain.     simvastatin  (ZOCOR ) 40 MG tablet Take 1 tablet (40 mg total) by mouth every morning. 90 tablet 1   No current facility-administered medications for this encounter.    ROS- All systems are reviewed and negative except as per the HPI above.  Physical Exam: Vitals:   09/30/23 1335  BP: 128/60  Pulse: 69   Weight: 67.3 kg  Height: 5\' 4"  (1.626 m)    GEN: Well nourished, well developed in no acute distress CARDIAC: Regular rate and rhythm with occasional ectopy, no murmurs, rubs, gallops RESPIRATORY:  Clear to auscultation without rales, wheezing or rhonchi  ABDOMEN: Soft, non-tender, non-distended EXTREMITIES:  No edema; No deformity    Wt Readings from Last 3 Encounters:  09/30/23 67.3 kg  09/02/23 65.3 kg  07/12/23 63 kg    EKG today demonstrates  SR, PVC Vent. rate 69 BPM PR interval 178 ms QRS duration 86 ms QT/QTcB 438/469 ms   Echo 06/25/22 demonstrated   1. Left ventricular ejection fraction, by estimation, is 55 to 60%. The  left ventricle has normal function. The left ventricle has no regional  wall motion abnormalities. Left ventricular diastolic parameters were  normal.   2. Right ventricular systolic function is normal. The right ventricular  size is normal. There is normal pulmonary artery systolic pressure.   3. Left atrial size was mildly dilated.   4. The mitral valve is abnormal. Mild mitral valve regurgitation. No  evidence of mitral stenosis.   5. The aortic valve is tricuspid. Aortic valve regurgitation is not  visualized. No aortic stenosis is present.   6. The inferior vena cava is normal in size with greater than 50%  respiratory variability, suggesting right atrial pressure of 3 mmHg.    Epic records are reviewed at length today  CHA2DS2-VASc Score = 3  The patient's score is based upon: CHF History: 0 HTN History: 0 Diabetes History: 0 Stroke History: 0 Vascular Disease History: 1 (aortic atherosclerosis) Age Score: 1 Gender Score: 1       ASSESSMENT AND PLAN: Paroxysmal Atrial Fibrillation (ICD10:  I48.0) The patient's CHA2DS2-VASc score is 3, indicating a 3.2% annual risk of stroke.   S/p afib ablation 09/02/23 Patient appears to be maintaining SR Continue flecainide  50 mg BID for now Continue Toprol  12.5 mg BID Continue Eliquis  5  mg BID with no missed doses for 3 months post ablation.  Continue diltiazem  30 mg PRN q 4 hours for heart racing.  Apple Watch for home monitoring.   Secondary Hypercoagulable State (ICD10:  D68.69) The patient is at significant risk for stroke/thromboembolism based upon her CHA2DS2-VASc Score of 3.  Continue Apixaban  (Eliquis ). No bleeding issues.   High Risk Medication Monitoring (ICD 10: Z79.899) Intervals on ECG acceptable for flecainide  monitoring.       Follow up with Creighton Doffing as scheduled.    Myrtha Ates PA-C [facility] [address] [place], [place] [address] [phone] 09/30/2023 1:42 PM

## 2023-10-08 ENCOUNTER — Encounter: Payer: Self-pay | Admitting: Adult Health

## 2023-10-08 ENCOUNTER — Ambulatory Visit: Admitting: Adult Health

## 2023-10-08 DIAGNOSIS — F411 Generalized anxiety disorder: Secondary | ICD-10-CM | POA: Diagnosis not present

## 2023-10-08 MED ORDER — ALPRAZOLAM 0.25 MG PO TABS: ORAL_TABLET | ORAL | 2 refills | Status: DC

## 2023-10-08 NOTE — Progress Notes (Signed)
 Leah Reeves [id number] [birth year] 73 y.o.  Subjective:   Patient ID:  Leah Reeves is a 73 y.o. (DOB August 01, 1950) female.  Chief Complaint: No chief complaint on file.   HPI Leah Reeves presents to the office today for follow-up of GAD.  Describes mood today as "ok". Pleasant. Denies tearfulness. Mood symptoms - denies depression an irritability. Reports improved interest and motivation. Reports decreased anxiety. Denies recent panic attack. Reports decreased worry, rumination and over thinking. Reports mood as stable. Stating "I feel like I'm doing better". Taking medications as prescribed.  Energy levels improved. Active, but does not have a regular exercise routine.   Enjoys some usual interests and activities. Married. Lives with husband. Has a step son and 3 grand children. Spending time with family. Appetite adequate. Weight gain. Sleeps well most nights. Averages 6 to 7 hours. Reports a 30 minute nap during the day.  Focus and concentration stable. Completing tasks. Managing aspects of household. 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  Denies AH or VH. Denies self harm. Denies substance use.  Previous medication trials: Denies   Flowsheet Row Admission (Discharged) from 09/02/2023 in [facility] CARDIAC CATH LAB ED from 06/25/2023 in [facility] Emergency Department at [facility]  C-SSRS RISK CATEGORY No Risk No Risk        Review of Systems:  Review of Systems  Musculoskeletal:  Negative for gait problem.  Neurological:  Negative for tremors.  Psychiatric/Behavioral:         Please refer to HPI    Medications: I have reviewed the patient's current medications.  Current Outpatient Medications  Medication Sig Dispense Refill   acetaminophen  (TYLENOL ) 500 MG tablet Take 1 tablet (500 mg total) by mouth every 8 (eight) hours as needed.     ALPRAZolam  (XANAX ) 0.25 MG tablet Take 1/2 tablet three times daily. 90 tablet 0   Ascorbic Acid  (VITAMIN C) 500 MG CHEW Chew 500 mg by mouth daily.     buPROPion (WELLBUTRIN XL) 150 MG 24 hr tablet Take 150 mg by mouth every morning.     cholecalciferol (VITAMIN D) 1000 units tablet Take 1,000 Units by mouth daily.     clobetasol  ointment (TEMOVATE ) 0.05 % Apply 1 Application topically 2 (two) times a week. Thin application on affected vulva. 30 g 4   diltiazem  (CARDIZEM ) 30 MG tablet Take 1 tablet (30 mg total) by mouth 4 (four) times daily as needed. 30 tablet 1   ELIQUIS  5 MG TABS tablet TAKE 1 TABLET BY MOUTH TWICE  DAILY 200 tablet 2   flecainide  (TAMBOCOR ) 50 MG tablet TAKE 1 TABLET BY MOUTH TWICE A DAY 180 tablet 3   FLUoxetine (PROZAC) 40 MG capsule Take 40 mg by mouth every morning.     metoprolol  succinate (TOPROL  XL) 25 MG 24 hr tablet Take 0.5 tablets (12.5 mg total) by mouth daily. 45 tablet 3   nitroGLYCERIN  (NITROSTAT ) 0.4 MG SL tablet Place 0.4 mg under the tongue every 5 (five) minutes as needed for chest pain.     simvastatin  (ZOCOR ) 40 MG tablet Take 1 tablet (40 mg total) by mouth every morning. 90 tablet 1   No current facility-administered medications for this visit.    Medication Side Effects: None  Allergies:  Allergies  Allergen Reactions   Codeine Nausea And Vomiting   Lipitor [Atorvastatin ]     Panic Attack    Azithromycin Rash    Past Medical History:  Diagnosis Date  Anxiety    Arthritis    knees -generalized   Asthma    Atrial fibrillation (HCC)    Bronchitis    Depression    Headache    past history - none at present   Heart murmur    Osteopenia    Perimenopausal vasomotor symptoms    Vertigo    tx. 6 months ago- no problems now,very mild occ.    Past Medical History, Surgical history, Social history, and Family history were reviewed and updated as appropriate.   Please see review of systems for further details on the patient's review from today.   Objective:   Physical Exam:  LMP 05/25/2008   Physical Exam Constitutional:       General: She is not in acute distress. Musculoskeletal:        General: No deformity.  Neurological:     Mental Status: She is alert and oriented to person, place, and time.     Coordination: Coordination normal.  Psychiatric:        Attention and Perception: Attention and perception normal. She does not perceive auditory or visual hallucinations.        Mood and Affect: Mood normal. Mood is not anxious or depressed. Affect is not labile, blunt, angry or inappropriate.        Speech: Speech normal.        Behavior: Behavior normal.        Thought Content: Thought content normal. Thought content is not paranoid or delusional. Thought content does not include homicidal or suicidal ideation. Thought content does not include homicidal or suicidal plan.        Cognition and Memory: Cognition and memory normal.        Judgment: Judgment normal.     Comments: Insight intact     Lab Review:     Component Value Date/Time   NA 140 05/01/2019 1331   K 4.1 05/01/2019 1331   CL 106 05/01/2019 1331   CO2 24 10/26/2017 1214   GLUCOSE 114 (H) 05/01/2019 1331   BUN 12 05/01/2019 1331   CREATININE 0.70 05/01/2019 1331   CALCIUM  9.5 10/26/2017 1214   GFRNONAA >60 10/26/2017 1214   GFRAA >60 10/26/2017 1214       Component Value Date/Time   WBC 9.1 10/26/2017 1214   RBC 5.15 (H) 10/26/2017 1214   HGB 14.3 05/01/2019 1331   HCT 42.0 05/01/2019 1331   PLT 370 10/26/2017 1214   MCV 91.8 10/26/2017 1214   MCH 29.7 10/26/2017 1214   MCHC 32.3 10/26/2017 1214   RDW 12.8 10/26/2017 1214   LYMPHSABS 3.5 09/09/2017 1706   MONOABS 0.8 09/09/2017 1706   EOSABS 0.4 09/09/2017 1706   BASOSABS 0.1 09/09/2017 1706    No results found for: "POCLITH", "LITHIUM"   No results found for: "PHENYTOIN", "PHENOBARB", "VALPROATE", "CBMZ"   .res Assessment: Plan:    Treatment Plan/Recommendations:   Prozac 40mg  daily Wellbutrin XL 150mg  daily  Decrease - Xanax  0.25mg  BID. Will continue to taper  down as tolerated.  RTC 4 weeks  20 minutes spent dedicated to the care of this patient on the date of this encounter to include pre-visit review of records, ordering of medication, post visit documentation, and face-to-face time with the patient discussing GAD. Discussed medication changes with patient - will plan to reduce Xanax  dose with anxiety improving.    Discussed potential benefits, risk, and side effects of benzodiazepines to include potential risk of tolerance and dependence, as well as  possible drowsiness.  Advised patient not to drive if experiencing drowsiness and to take lowest possible effective dose to minimize risk of dependence and tolerance.  There are no diagnoses linked to this encounter.   Please see After Visit Summary for patient specific instructions.  Future Appointments  Date Time Provider Department Center  10/22/2023 10:00 AM [name], LCSW [facility] None  11/10/2023 11:00 AM [name], LCSW [facility] None  12/01/2023  2:45 PM Thomasena Fleming, NP [facility]    No orders of the defined types were placed in this encounter.   -------------------------------

## 2023-10-13 ENCOUNTER — Ambulatory Visit: Admitting: Psychiatry

## 2023-10-19 ENCOUNTER — Telehealth: Payer: Self-pay | Admitting: Cardiology

## 2023-10-19 NOTE — Telephone Encounter (Signed)
 Patient calling the office for samples of medication:   1.  What medication and dosage are you requesting samples for? Eliquis   2.  Are you currently out of this medication?   Has 1 more left for tomorrow.    Can patient get samples until [facility] delivers her eliquis , if ordered today then she can get it by next Wednesday. If not, can a small supply be sent to:  [id number] - [place], [place] - [address]

## 2023-10-20 MED ORDER — APIXABAN 5 MG PO TABS: 5.0000 mg | ORAL_TABLET | Freq: Two times a day (BID) | ORAL | 0 refills | Status: DC

## 2023-10-20 NOTE — Telephone Encounter (Signed)
 2 weeks of Eliquis  5 mg BID samples is at left front desk for patient to pickup.

## 2023-10-22 ENCOUNTER — Ambulatory Visit: Admitting: Psychiatry

## 2023-10-24 ENCOUNTER — Other Ambulatory Visit: Payer: Self-pay | Admitting: Cardiology

## 2023-11-04 ENCOUNTER — Telehealth: Payer: Self-pay | Admitting: Cardiology

## 2023-11-04 NOTE — Telephone Encounter (Signed)
 Pt aware I will send this to Dr. Lawana Pray to see if she may hold her Flecainide  (she reports no afib since procedure).  Aware that she will need to remain on Eliquis  & Metoprolol  and can evaluate holding/stopping Metoprolol  at her 3 month follow up. Patient verbalized understanding and agreeable to plan.

## 2023-11-04 NOTE — Telephone Encounter (Signed)
 Pt aware to stop Flecainide .  Reminded to keep her appt next month.  Patient agreeable to plan.

## 2023-11-04 NOTE — Telephone Encounter (Signed)
 Pt c/o medication issue:  1. Name of Medication:  flecainide  (TAMBOCOR ) 50 MG tablet metoprolol  succinate (TOPROL  XL) 25 MG 24 hr tablet  ELIQUIS  5 MG TABS tablet   2. How are you currently taking this medication (dosage and times per day)?   3. Are you having a reaction (difficulty breathing--STAT)?   4. What is your medication issue? Patient states she has been having issues with anxiety since she has been on the medications. She would like to know if Cody Das will take her off of them as she states Dr. Lawana Pray mentioned reducing doses.

## 2023-11-05 ENCOUNTER — Telehealth: Admitting: Adult Health

## 2023-11-10 ENCOUNTER — Ambulatory Visit: Admitting: Psychiatry

## 2023-11-10 DIAGNOSIS — F411 Generalized anxiety disorder: Secondary | ICD-10-CM

## 2023-11-10 NOTE — Progress Notes (Signed)
 [facility] Counselor/Therapist Progress Note  Patient ID: Leah Reeves, MRN: [id number],    Date: 11/10/2023  Time Spent: 53 minutes   Treatment Type: Individual Therapy  Reported Symptoms: anxiety improved but had some challenges recently, some occasional impulse control   Mental Status Exam:  Appearance:   Casual and Neat     Behavior:  Appropriate, Sharing, and Motivated  Motor:  Normal  Speech/Language:   Clear and Coherent  Affect:  anxious  Mood:  anxious  Thought process:  goal directed  Thought content:    Rumination  Sensory/Perceptual disturbances:    WNL  Orientation:  oriented to person, place, time/date, situation, day of week, month of year, year, and stated date of November 10, 2023   Attention:  Good  Concentration:  Good  Memory:  WNL  Fund of knowledge:   Good  Insight:    Good  Judgment:   Good  Impulse Control:  Good/Fair   Risk Assessment: Danger to Self:  No Self-injurious Behavior: No Danger to Others: No Duty to Warn:no Physical Aggression / Violence:No  Access to Firearms a concern: No  Gang Involvement:No   Subjective: Patient today reporting she's had some challenges with her anxiety since her last appt. Had a few occasions where she struggles with challenges in trying not to over-react to challenging circumstances. Shared some family  situations where she is making some better choices, letting go of any negative judgment towards other, even if we don't all agree on some things. Also needing and wanting to get involved in more activities while I can still be healthy and able to participate.  Patient continues to be pleasant and interacts well in session, working diligently on her goals and changes she wants to make to be more physically and emotionally healthy.  Anxiety has improved and she also notices less worrying about herself and others.  Less emphasis on trying to people-please and focusing more on positives versus negatives.   Healthier boundaries in place.  Does notice her progress and feels positive about it.  Continues to progress on her goals.  Overall mood improving.  Will see again in approximately 1 month.  Interventions: Cognitive Behavioral Therapy and Ego-Supportive 1.Reduce overall level, frequency, and intensity of the anxiety so that daily functioning is not impaired. 2.Verbalize an understanding of the role that fearful thinking plays in creating fears, excessive worry, and persistent anxiety symptoms. 3.Develop behavioral and cognitive stategies to reduce or eliminate the irrational anxiety.    Diagnosis:   ICD-10-CM   1. Generalized anxiety disorder  F41.1      Plan: Patient today in session working further on her anxiety, not overreacting to the challenging circumstances, better managing family situations, and decreasing her habit of worrying about herself and others.  Decreased people pleasing.  Looking more for what might go right versus wrong.  Feeling really good about progress made and that she has healthier boundaries in place.  Still having some nervousness but notes that it is also improving along with her self-esteem.  Leah Reeves is showing progress and needs to continue working with goal-directed behaviors to keep moving in a healthier and more positive direction into the future.  Reports overall mood improving and we will see her again in approximately 1 month.  Goal review and progress/challenges noted with patient.  Next appointment within 2 to 3 weeks.   [name], LCSW

## 2023-11-16 ENCOUNTER — Ambulatory Visit: Admitting: Adult Health

## 2023-11-16 ENCOUNTER — Encounter: Payer: Self-pay | Admitting: Adult Health

## 2023-11-16 DIAGNOSIS — F411 Generalized anxiety disorder: Secondary | ICD-10-CM | POA: Diagnosis not present

## 2023-11-16 DIAGNOSIS — H43813 Vitreous degeneration, bilateral: Secondary | ICD-10-CM | POA: Diagnosis not present

## 2023-11-16 NOTE — Progress Notes (Signed)
 Leah Reeves [id number] [birth year] 73 y.o.  Virtual Visit via Telephone Note  I connected with pt on 11/16/23 at 12:00 PM EDT by telephone and verified that I am speaking with the correct person using two identifiers.   I discussed the limitations, risks, security and privacy concerns of performing an evaluation and management service by telephone and the availability of in person appointments. I also discussed with the patient that there may be a patient responsible charge related to this service. The patient expressed understanding and agreed to proceed.   I discussed the assessment and treatment plan with the patient. The patient was provided an opportunity to ask questions and all were answered. The patient agreed with the plan and demonstrated an understanding of the instructions.   The patient was advised to call back or seek an in-person evaluation if the symptoms worsen or if the condition fails to improve as anticipated.  I provided 15 minutes of non-face-to-face time during this encounter.  The patient was located at home.  The provider was located at [facility].   Leah LOISE Sayers, NP   Subjective:   Patient ID:  Leah Reeves is a 73 y.o. (DOB 1950-07-30) female.  Chief Complaint: No chief complaint on file.   HPI Leah Reeves presents for follow-up of GAD.  Describes mood today as ok. Pleasant. Denies tearfulness. Mood symptoms - denies depression an irritability. Reports improved interest and motivation. Reports decreased anxiety - every once in a while. Denies recent panic attacks. Denies worry, rumination and over thinking. Reports mood as stable. Stating I feel like I'm doing ok. Taking medications as prescribed.  Energy levels stable. Active, but does not have a regular exercise routine.   Enjoys some usual interests and activities. Married. Lives with husband. Has a step son and 3 grand children. Spending time with family. Appetite  adequate. Weight stable. Sleeps well most nights. Averages 6 to 7 hours. Reports a 30 minute nap during the day.  Focus and concentration stable. Completing tasks. Managing aspects of household. 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  Denies AH or VH. Denies self harm. Denies substance use.  Previous medication trials: Denies   Review of Systems:  Review of Systems  Musculoskeletal:  Negative for gait problem.  Neurological:  Negative for tremors.  Psychiatric/Behavioral:         Please refer to HPI    Medications: I have reviewed the patient's current medications.  Current Outpatient Medications  Medication Sig Dispense Refill   acetaminophen  (TYLENOL ) 500 MG tablet Take 1 tablet (500 mg total) by mouth every 8 (eight) hours as needed.     ALPRAZolam  (XANAX ) 0.25 MG tablet Take 1/2 tablet three times daily. 90 tablet 2   apixaban  (ELIQUIS ) 5 MG TABS tablet Take 1 tablet (5 mg total) by mouth 2 (two) times daily. 28 tablet 0   Ascorbic Acid (VITAMIN C) 500 MG CHEW Chew 500 mg by mouth daily.     buPROPion (WELLBUTRIN XL) 150 MG 24 hr tablet Take 150 mg by mouth every morning.     cholecalciferol (VITAMIN D) 1000 units tablet Take 1,000 Units by mouth daily.     clobetasol  ointment (TEMOVATE ) 0.05 % Apply 1 Application topically 2 (two) times a week. Thin application on affected vulva. 30 g 4   diltiazem  (CARDIZEM ) 30 MG tablet Take 1 tablet (30 mg total) by mouth 4 (four) times daily as needed. 30 tablet 1   ELIQUIS  5 MG TABS tablet TAKE  1 TABLET BY MOUTH TWICE  DAILY 200 tablet 2   FLUoxetine (PROZAC) 40 MG capsule Take 40 mg by mouth every morning.     metoprolol  succinate (TOPROL  XL) 25 MG 24 hr tablet Take 0.5 tablets (12.5 mg total) by mouth daily. 45 tablet 3   nitroGLYCERIN  (NITROSTAT ) 0.4 MG SL tablet Place 0.4 mg under the tongue every 5 (five) minutes as needed for chest pain.     simvastatin  (ZOCOR ) 40 MG tablet Take 1 tablet (40 mg total) by mouth every morning. 90 tablet  1   No current facility-administered medications for this visit.    Medication Side Effects: None  Allergies:  Allergies  Allergen Reactions   Codeine Nausea And Vomiting   Lipitor [Atorvastatin ]     Panic Attack    Azithromycin Rash    Past Medical History:  Diagnosis Date   Anxiety    Arthritis    knees -generalized   Asthma    Atrial fibrillation (HCC)    Bronchitis    Depression    Headache    past history - none at present   Heart murmur    Osteopenia    Perimenopausal vasomotor symptoms    Vertigo    tx. 6 months ago- no problems now,very mild occ.    Family History  Problem Relation Age of Onset   Diabetes Mother    Hypertension Mother    Heart disease Father    Diabetes Sister    Heart disease Sister     Social History   Socioeconomic History   Marital status: Married    Spouse name: Not on file   Number of children: Not on file   Years of education: Not on file   Highest education level: Not on file  Occupational History   Not on file  Tobacco Use   Smoking status: Former    Current packs/day: 0.00    Average packs/day: 0.5 packs/day for 3.0 years (1.5 ttl pk-yrs)    Types: Cigarettes    Start date: 05/25/1988    Quit date: 05/26/1991    Years since quitting: 32.4    Passive exposure: Past   Smokeless tobacco: Never   Tobacco comments:    Former smoker 04/29/22  Vaping Use   Vaping status: Never Used  Substance and Sexual Activity   Alcohol use: Not Currently   Drug use: No   Sexual activity: Yes    Partners: Male    Birth control/protection: Post-menopausal    Comment: 1st intercourse- 23, partners- 3, married- 27 yrs   Other Topics Concern   Not on file  Social History Narrative   Not on file   Social Drivers of Health   Financial Resource Strain: Not on file  Food Insecurity: Not on file  Transportation Needs: Not on file  Physical Activity: Not on file  Stress: Not on file  Social Connections: Not on file  Intimate  Partner Violence: Not on file    Past Medical History, Surgical history, Social history, and Family history were reviewed and updated as appropriate.   Please see review of systems for further details on the patient's review from today.   Objective:   Physical Exam:  LMP 05/25/2008   Physical Exam Constitutional:      General: She is not in acute distress.  Musculoskeletal:        General: No deformity.   Neurological:     Mental Status: She is alert and oriented to person, place, and time.  Coordination: Coordination normal.   Psychiatric:        Attention and Perception: Attention and perception normal. She does not perceive auditory or visual hallucinations.        Mood and Affect: Mood normal. Mood is not anxious or depressed. Affect is not labile, blunt, angry or inappropriate.        Speech: Speech normal.        Behavior: Behavior normal.        Thought Content: Thought content normal. Thought content is not paranoid or delusional. Thought content does not include homicidal or suicidal ideation. Thought content does not include homicidal or suicidal plan.        Cognition and Memory: Cognition and memory normal.        Judgment: Judgment normal.     Comments: Insight intact     Lab Review:     Component Value Date/Time   NA 140 05/01/2019 1331   K 4.1 05/01/2019 1331   CL 106 05/01/2019 1331   CO2 24 10/26/2017 1214   GLUCOSE 114 (H) 05/01/2019 1331   BUN 12 05/01/2019 1331   CREATININE 0.70 05/01/2019 1331   CALCIUM  9.5 10/26/2017 1214   GFRNONAA >60 10/26/2017 1214   GFRAA >60 10/26/2017 1214       Component Value Date/Time   WBC 9.1 10/26/2017 1214   RBC 5.15 (H) 10/26/2017 1214   HGB 14.3 05/01/2019 1331   HCT 42.0 05/01/2019 1331   PLT 370 10/26/2017 1214   MCV 91.8 10/26/2017 1214   MCH 29.7 10/26/2017 1214   MCHC 32.3 10/26/2017 1214   RDW 12.8 10/26/2017 1214   LYMPHSABS 3.5 09/09/2017 1706   MONOABS 0.8 09/09/2017 1706   EOSABS 0.4  09/09/2017 1706   BASOSABS 0.1 09/09/2017 1706    No results found for: POCLITH, LITHIUM   No results found for: PHENYTOIN, PHENOBARB, VALPROATE, CBMZ   .res Assessment: Plan:    Treatment Plan/Recommendations:   Prozac 40mg  daily Wellbutrin XL 150mg  daily  Decrease - Xanax  0.25mg  BID. Plans to taper down as tolerated.  RTC 4 to 5 weeks  15 minutes spent dedicated to the care of this patient on the date of this encounter to include pre-visit review of records, ordering of medication, post visit documentation, and face-to-face time with the patient discussing GAD. Discussed medication changes with patient - will plan to reduce Xanax  dose with anxiety improving.    Discussed potential benefits, risk, and side effects of benzodiazepines to include potential risk of tolerance and dependence, as well as possible drowsiness.  Advised patient not to drive if experiencing drowsiness and to take lowest possible effective dose to minimize risk of dependence and tolerance.   There are no diagnoses linked to this encounter.  Please see After Visit Summary for patient specific instructions.  Future Appointments  Date Time Provider Department Center  11/16/2023 12:00 PM Eliceo Gladu, Leah Mattocks, NP [facility] None  12/01/2023  2:45 PM Aniceto Daphne CROME, NP [facility]  12/21/2023 11:00 AM Sherlynn Sober, LCSW [facility] None    No orders of the defined types were placed in this encounter.     -------------------------------

## 2023-11-22 DIAGNOSIS — J449 Chronic obstructive pulmonary disease, unspecified: Secondary | ICD-10-CM | POA: Diagnosis not present

## 2023-12-01 ENCOUNTER — Encounter: Payer: Self-pay | Admitting: Pulmonary Disease

## 2023-12-01 ENCOUNTER — Ambulatory Visit: Attending: Pulmonary Disease | Admitting: Pulmonary Disease

## 2023-12-01 VITALS — BP 128/74 | HR 56 | Ht 64.0 in | Wt 145.0 lb

## 2023-12-01 DIAGNOSIS — I480 Paroxysmal atrial fibrillation: Secondary | ICD-10-CM | POA: Diagnosis not present

## 2023-12-01 NOTE — Progress Notes (Signed)
 Electrophysiology Office Note:   Date:  12/01/2023  ID:  Leah Reeves, DOB Apr 07, 1951, MRN [id number]  Primary Cardiologist: Oneil Parchment, MD Primary Heart Failure: None Electrophysiologist: Will Gladis Norton, MD      History of Present Illness:   Leah Reeves is a 73 y.o. female with h/o AF, PVC's, HLD seen today for routine electrophysiology follow-up s/p Ablation.  Since last being seen in our clinic the patient reports doing very well.  She has not noted any episodes of atrial fibrillation since her ablation.  She monitors for AF with an Apple Watch.  She describes recent episodes of midepigastric discomfort that is relieved by belching.  She denies chest pain, palpitations, dyspnea, PND, orthopnea, nausea, vomiting, dizziness, syncope, edema, weight gain, or early satiety.    Review of systems complete and found to be negative unless listed in HPI.   EP Information / Studies Reviewed:    EKG is ordered today. Personal review as below.  EKG Interpretation Date/Time:  Wednesday December 01 2023 14:53:58 EDT Ventricular Rate:  56 PR Interval:  160 QRS Duration:  74 QT Interval:  410 QTC Calculation: 395 R Axis:   71  Text Interpretation: Sinus bradycardia with marked sinus arrhythmia Confirmed by Aniceto Jarvis ([id number]) on 12/01/2023 3:10:07 PM   Studies:  CT Cardiac Morph / Pulm 08/19/23 > normal PV drainage into LAA, no PFO/ASD, normal coronary origin, CAC score of 0, aortic atherosclerosis EPS 09/02/23 > pulmonary vein isolation with pulsed field ablation  Arrhythmia / AAD AF     Risk Assessment/Calculations:    CHA2DS2-VASc Score = 3   This indicates a 3.2% annual risk of stroke. The patient's score is based upon: CHF History: 0 HTN History: 0 Diabetes History: 0 Stroke History: 0 Vascular Disease History: 1 Age Score: 1 Gender Score: 1             Physical Exam:   VS:  BP 128/74   Pulse (!) 56   Ht 5' 4 (1.626 m)   Wt 145 lb (65.8 kg)   LMP  05/25/2008   SpO2 (!) 56%   BMI 24.89 kg/m    Wt Readings from Last 3 Encounters:  12/01/23 145 lb (65.8 kg)  09/30/23 148 lb 6.4 oz (67.3 kg)  09/02/23 144 lb (65.3 kg)     GEN: Well nourished, well developed in no acute distress NECK: No JVD; No carotid bruits CARDIAC: Regular rate and rhythm, no murmurs, rubs, gallops RESPIRATORY:  Clear to auscultation without rales, wheezing or rhonchi  ABDOMEN: Soft, non-tender, non-distended EXTREMITIES:  No edema; No deformity   ASSESSMENT AND PLAN:    Paroxysmal Atrial Fibrillation  CHA2DS2-VASc 3 (age, gender, aortic atherosclerosis) -continue OAC for stroke prophylaxis  -EKG with NSR -Patient indicates having vivid and bad dreams on Toprol  all and like to stop, reviewed she should check her blood pressure over the next couple weeks to ensure that she does not have elevated blood pressures.  If so she should contact our clinic for BP review.  -no AF burden post ablation -Monitors with an Apple Watch -pt hopeful to be considered for coming off OAC > discussed risk score and that would like to see her maintain in NSR for prolonged period of time & had definitive means of monitoring such as a Loop Recorder before considering. Given she will gain an additional point with her next birthday, this may not be a realistic option   Secondary Hypercoagulable State  -continue Eliquis  5mg  BID, dose  reviewed and appropriate by age / wt   HLD  -per primary   Follow up with Dr. Inocencio in 7 months   Signed, Daphne Barrack, NP-C, AGACNP-BC [facility] - Electrophysiology  12/01/2023, 5:57 PM

## 2023-12-01 NOTE — Patient Instructions (Signed)
 Medication Instructions:  Stop metoprolol  succinate (Toprol  XL) *If you need a refill on your cardiac medications before your next appointment, please call your pharmacy*  Lab Work: None ordered If you have labs (blood work) drawn today and your tests are completely normal, you will receive your results only by: MyChart Message (if you have MyChart) OR A paper copy in the mail If you have any lab test that is abnormal or we need to change your treatment, we will call you to review the results.  Follow-Up: At [facility], you and your health needs are our priority.  As part of our continuing mission to provide you with exceptional heart care, our providers are all part of one team.  This team includes your primary Cardiologist (physician) and Advanced Practice Providers or APPs (Physician Assistants and Nurse Practitioners) who all work together to provide you with the care you need, when you need it.  Your next appointment:   January 2026  Provider:   Soyla Norton, MD only  Other instructions: 1.Wear your Apple watch 2.Monitor your blood pressure and let us  know if your top number is consistently greater than 130

## 2023-12-03 ENCOUNTER — Telehealth: Payer: Self-pay | Admitting: Adult Health

## 2023-12-03 NOTE — Telephone Encounter (Signed)
 Pt called requesting new Rx for Alprazolam  for .25 mg 2/d to [facility]. Dose change. Apt 7/30

## 2023-12-03 NOTE — Telephone Encounter (Signed)
 LF 6/1 for 60 days.  10/24/2023 10/08/2023 1  Alprazolam  0.25 Mg Tablet 90.00 60

## 2023-12-06 ENCOUNTER — Other Ambulatory Visit: Payer: Self-pay

## 2023-12-06 DIAGNOSIS — F411 Generalized anxiety disorder: Secondary | ICD-10-CM

## 2023-12-06 MED ORDER — ALPRAZOLAM 0.25 MG PO TABS: ORAL_TABLET | ORAL | 0 refills | Status: DC

## 2023-12-06 NOTE — Telephone Encounter (Signed)
 Pended.

## 2023-12-07 ENCOUNTER — Other Ambulatory Visit: Payer: Self-pay

## 2023-12-07 ENCOUNTER — Telehealth: Payer: Self-pay | Admitting: Adult Health

## 2023-12-07 DIAGNOSIS — F411 Generalized anxiety disorder: Secondary | ICD-10-CM

## 2023-12-07 MED ORDER — ALPRAZOLAM 0.25 MG PO TABS: ORAL_TABLET | ORAL | 0 refills | Status: DC

## 2023-12-07 NOTE — Telephone Encounter (Signed)
 Pt needs 1 week supply of Alprazolam  sent to [facility]. Her script is coming by mail, set for delivery 7/20-7/23 and she is completely out.

## 2023-12-07 NOTE — Telephone Encounter (Signed)
 Pended.

## 2023-12-14 ENCOUNTER — Telehealth: Payer: Self-pay | Admitting: Pulmonary Disease

## 2023-12-14 ENCOUNTER — Ambulatory Visit: Admitting: Psychiatry

## 2023-12-14 DIAGNOSIS — I480 Paroxysmal atrial fibrillation: Secondary | ICD-10-CM

## 2023-12-14 DIAGNOSIS — F411 Generalized anxiety disorder: Secondary | ICD-10-CM | POA: Diagnosis not present

## 2023-12-14 DIAGNOSIS — I10 Essential (primary) hypertension: Secondary | ICD-10-CM

## 2023-12-14 NOTE — Telephone Encounter (Signed)
 Called patient back about message. Patient stated her BP has been moving up. Patient stated the last week her SBP was in the 140's and today's BP 152/71 with HR 68. Patient is wondering if she needs to go back metoprolol . Will send message to Leotis Barrack for advisement.

## 2023-12-14 NOTE — Progress Notes (Signed)
 [facility] Counselor/Therapist Progress Note  Patient ID: Leah Reeves, MRN: [id number],    Date: 12/14/2023  Time Spent: 53 minutes   Treatment Type: Individual Therapy  Reported Symptoms: anxiety, some shakiness, depression, can't get certain things out of my thoughts, worrying, fears    Mental Status Exam:  Appearance:   Casual     Behavior:  Appropriate, Sharing, and Motivated  Motor:  Normal  Speech/Language:   Clear and Coherent  Affect:  Depressed and anxious  Mood:  anxious and depressed  Thought process:  goal directed  Thought content:    Obsessions and Rumination  Sensory/Perceptual disturbances:    WNL  Orientation:  oriented to person, place, time/date, situation, day of week, month of year, year, and stated date of December 14, 2023  Attention:  Good  Concentration:  Good  Memory:  WNL  Fund of knowledge:   Good  Insight:    Good and Fair  Judgment:   Good  Impulse Control:  Good   Risk Assessment: Danger to Self:  No Self-injurious Behavior: No Danger to Others: No Duty to Warn:no Physical Aggression / Violence:No  Access to Firearms a concern: No  Gang Involvement:No   Subjective:  Patient session today reporting some recent challenges she has had with her anxiety and depression.  States that she has felt anxious, shaky, depressed, worried, and fearful at times and having trouble getting some thoughts out of her head, particularly after having some workmen at her house doing some repairs.  Seems to be realizing that she may have been overthinking and making assumptions that were an accurate but has been hard for her to move beyond this.  Does acknowledge that a lot of the things I worry about, I do not really need to worry about.  Unpacked this idea more with patient and talked about it in detail including how this tends to happen for patient and what she does and trying to cope, that actually works against her rather than helping her.  Use some of  the examples today that she gave in reference to her worrying and walk-through these examples helping patient express how she tended to respond in the situations versus how she could respond differently and get a different reaction including less worrying and less getting caught up in negative/anxious thoughts.  States that is easier for her to understand when we are sitting here in the session talking about it but harder for her to do on her own outside of sessions.  Decreased people pleasing.  Trying to focus on more positives versus negatives.  Exercising healthy boundaries.  Overall mood had declined a bit before coming in today for session and feels that she can get to a better place in managing her anxiety and worries and is following up on therapy homework.  Encouraged her to believe more in herself and to make more consistent efforts to practice exactly as we did in session today, which she seems to feel is doable for her.  Will see again within 2 weeks   Interventions: Cognitive Behavioral Therapy, Solution-Oriented/Positive Psychology, and Ego-Supportive 1.Reduce overall level, frequency, and intensity of the anxiety so that daily functioning is not impaired. 2.Verbalize an understanding of the role that fearful thinking plays in creating fears, excessive worry, and persistent anxiety symptoms. 3.Develop behavioral and cognitive stategies to reduce or eliminate the irrational anxiety.     Diagnosis:   ICD-10-CM   1. Generalized anxiety disorder  F41.1  Plan:  Patient in session today and continues working on her anxiety, getting stuck on certain thoughts, trying not to over-react to changing circumstances. Wanting to get rid of my anxious thoughts. Thoughts especially about a work crew that had been at her house working. Focusing in more on her tendency worry excessively. Did well in talking through and working on her worrying and how it impacts her in negative ways, leading patient  to get stuck in her worry. Most things I worry about are not things that I need to worry about. Working further with patient on this pattern of thought today in session. Encouraging patient to look more for what might go right versus wrong. Used some of our time today focusing on increasing patient's self-esteem. [facility] continues to progress and needs to continue her work on goal-directed behaviors so she can keep moving in a healthier and more positive direction into the future. Patient to be seen within approx. 1 month.    Goal review and progress/challenges noted with patient.  Next appointment within 2 to 3 weeks.   Barnie Bunde, LCSW

## 2023-12-14 NOTE — Telephone Encounter (Signed)
 Pt c/o medication issue:  1. Name of Medication: metoprolol  succinate (TOPROL  XL) 25 MG 24 hr tablet  2. How are you currently taking this medication (dosage and times per day)? N/A  3. Are you having a reaction (difficulty breathing--STAT)? No   4. What is your medication issue? Pt was told to stop this medication to help manage her BP its been in the 140 range and she would like to start this medication again

## 2023-12-15 ENCOUNTER — Other Ambulatory Visit (HOSPITAL_COMMUNITY): Payer: Self-pay

## 2023-12-15 MED ORDER — METOPROLOL SUCCINATE ER 25 MG PO TB24
12.5000 mg | ORAL_TABLET | Freq: Every day | ORAL | 2 refills | Status: AC
Start: 2023-12-15 — End: ?
  Filled 2023-12-15: qty 45, 90d supply, fill #0
  Filled 2024-03-06: qty 45, 90d supply, fill #1

## 2023-12-15 NOTE — Telephone Encounter (Addendum)
 Spoke to pt, relayed Daphne Barrack, NP's feedback as stated. She agrees to go back on the metoprolol . She will check her BP daily at least 2 hours after taking her BP med dosing for more accurate readings. She will report vital readings back to us  and any symptoms that may present as well. We discussed these in detail. Pt appreciates call.   Per provider instruction--Med list updated--pt restarted metoprolol  succinate 12.5mg  daily that was D/C'd at last office visit with Daphne Barrack, NP.

## 2023-12-17 ENCOUNTER — Encounter (HOSPITAL_COMMUNITY): Admitting: Physician Assistant

## 2023-12-21 ENCOUNTER — Ambulatory Visit (INDEPENDENT_AMBULATORY_CARE_PROVIDER_SITE_OTHER): Admitting: Psychiatry

## 2023-12-22 ENCOUNTER — Encounter: Payer: Self-pay | Admitting: Adult Health

## 2023-12-22 ENCOUNTER — Ambulatory Visit: Admitting: Adult Health

## 2023-12-22 DIAGNOSIS — F411 Generalized anxiety disorder: Secondary | ICD-10-CM | POA: Diagnosis not present

## 2023-12-22 NOTE — Progress Notes (Signed)
 Leah Reeves [id number] 09/28/1950 73 y.o.  Virtual Visit via Telephone Note  I connected with pt on 12/22/23 at 11:30 AM EDT by telephone and verified that I am speaking with the correct person using two identifiers.   I discussed the limitations, risks, security and privacy concerns of performing an evaluation and management service by telephone and the availability of in person appointments. I also discussed with the patient that there may be a patient responsible charge related to this service. The patient expressed understanding and agreed to proceed.   I discussed the assessment and treatment plan with the patient. The patient was provided an opportunity to ask questions and all were answered. The patient agreed with the plan and demonstrated an understanding of the instructions.   The patient was advised to call back or seek an in-person evaluation if the symptoms worsen or if the condition fails to improve as anticipated.  I provided 20 minutes of non-face-to-face time during this encounter.  The patient was located at home.  The provider was located at [facility].   Angeline LOISE Sayers, NP   Subjective:   Patient ID:  Leah Reeves is a 73 y.o. (DOB [birth year]) female.  Chief Complaint: No chief complaint on file.   HPI Leah Reeves presents for follow-up of GAD.  Describes mood today as ok. Pleasant. Denies tearfulness. Mood symptoms - reports some depression - I haven't felt happy go lucky like I used to be. She is getting out and doing things. Reports lower interest and motivation - 5 out of 10. Stating I don't feel relaxed. Reports decreased anxiety - still there a little. Denies irritability. Denies recent panic attacks. Denies worry, rumination and over thinking. Reports mood as variable. Stating I'm not feeling the way I used to feel - still feels like there is a dark cloud around. Taking medications as prescribed.  Energy levels stable.  Active, but does not have a regular exercise routine.   Enjoys some usual interests and activities. Married. Lives with husband. Has a step son and 3 grand children. Spending time with family. Appetite adequate. Weight stable. Sleeps well most nights. Averages 7 to 8 hours.  Focus and concentration stable. Completing tasks. Managing aspects of household. 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  Denies AH or VH. Denies self harm. Denies substance use.  Previous medication trials: Denies  Review of Systems:  Review of Systems  Musculoskeletal:  Negative for gait problem.  Neurological:  Negative for tremors.  Psychiatric/Behavioral:         Please refer to HPI    Medications: I have reviewed the patient's current medications.  Current Outpatient Medications  Medication Sig Dispense Refill   acetaminophen  (TYLENOL ) 500 MG tablet Take 1 tablet (500 mg total) by mouth every 8 (eight) hours as needed.     ALPRAZolam  (XANAX ) 0.25 MG tablet Take 1 tablet twice a day 14 tablet 0   Ascorbic Acid (VITAMIN C) 500 MG CHEW Chew 500 mg by mouth daily.     buPROPion (WELLBUTRIN XL) 150 MG 24 hr tablet Take 150 mg by mouth every morning.     cholecalciferol (VITAMIN D) 1000 units tablet Take 1,000 Units by mouth daily.     clobetasol  ointment (TEMOVATE ) 0.05 % Apply 1 Application topically 2 (two) times a week. Thin application on affected vulva. 30 g 4   diltiazem  (CARDIZEM ) 30 MG tablet Take 1 tablet (30 mg total) by mouth 4 (four) times daily as needed. 30  tablet 1   ELIQUIS  5 MG TABS tablet TAKE 1 TABLET BY MOUTH TWICE  DAILY 200 tablet 2   FLUoxetine (PROZAC) 40 MG capsule Take 40 mg by mouth every morning.     metoprolol  succinate (TOPROL  XL) 25 MG 24 hr tablet Take 0.5 tablets (12.5 mg total) by mouth daily. 45 tablet 2   nitroGLYCERIN  (NITROSTAT ) 0.4 MG SL tablet Place 0.4 mg under the tongue every 5 (five) minutes as needed for chest pain.     simvastatin  (ZOCOR ) 40 MG tablet Take 1 tablet (40  mg total) by mouth every morning. 90 tablet 1   No current facility-administered medications for this visit.    Medication Side Effects: None  Allergies:  Allergies  Allergen Reactions   Codeine Nausea And Vomiting   Lipitor [Atorvastatin ]     Panic Attack    Azithromycin Rash    Past Medical History:  Diagnosis Date   Anxiety    Arthritis    knees -generalized   Asthma    Atrial fibrillation (HCC)    Bronchitis    Depression    Headache    past history - none at present   Heart murmur    Osteopenia    Perimenopausal vasomotor symptoms    Vertigo    tx. 6 months ago- no problems now,very mild occ.    Family History  Problem Relation Age of Onset   Diabetes Mother    Hypertension Mother    Heart disease Father    Diabetes Sister    Heart disease Sister     Social History   Socioeconomic History   Marital status: Married    Spouse name: Not on file   Number of children: Not on file   Years of education: Not on file   Highest education level: Not on file  Occupational History   Not on file  Tobacco Use   Smoking status: Former    Current packs/day: 0.00    Average packs/day: 0.5 packs/day for 3.0 years (1.5 ttl pk-yrs)    Types: Cigarettes    Start date: 05/25/1988    Quit date: 05/26/1991    Years since quitting: 32.5    Passive exposure: Past   Smokeless tobacco: Never   Tobacco comments:    Former smoker 04/29/22  Vaping Use   Vaping status: Never Used  Substance and Sexual Activity   Alcohol use: Not Currently   Drug use: No   Sexual activity: Yes    Partners: Male    Birth control/protection: Post-menopausal    Comment: 1st intercourse- 23, partners- 3, married- 27 yrs   Other Topics Concern   Not on file  Social History Narrative   Not on file   Social Drivers of Health   Financial Resource Strain: Not on file  Food Insecurity: Not on file  Transportation Needs: Not on file  Physical Activity: Not on file  Stress: Not on file   Social Connections: Not on file  Intimate Partner Violence: Not on file    Past Medical History, Surgical history, Social history, and Family history were reviewed and updated as appropriate.   Please see review of systems for further details on the patient's review from today.   Objective:   Physical Exam:  LMP 05/25/2008   Physical Exam Constitutional:      General: She is not in acute distress. Musculoskeletal:        General: No deformity.  Neurological:     Mental Status: She is  alert and oriented to person, place, and time.     Coordination: Coordination normal.  Psychiatric:        Attention and Perception: Attention and perception normal. She does not perceive auditory or visual hallucinations.        Mood and Affect: Mood normal. Mood is not anxious or depressed. Affect is not labile, blunt, angry or inappropriate.        Speech: Speech normal.        Behavior: Behavior normal.        Thought Content: Thought content normal. Thought content is not paranoid or delusional. Thought content does not include homicidal or suicidal ideation. Thought content does not include homicidal or suicidal plan.        Cognition and Memory: Cognition and memory normal.        Judgment: Judgment normal.     Comments: Insight intact     Lab Review:     Component Value Date/Time   NA 140 05/01/2019 1331   K 4.1 05/01/2019 1331   CL 106 05/01/2019 1331   CO2 24 10/26/2017 1214   GLUCOSE 114 (H) 05/01/2019 1331   BUN 12 05/01/2019 1331   CREATININE 0.70 05/01/2019 1331   CALCIUM  9.5 10/26/2017 1214   GFRNONAA >60 10/26/2017 1214   GFRAA >60 10/26/2017 1214       Component Value Date/Time   WBC 9.1 10/26/2017 1214   RBC 5.15 (H) 10/26/2017 1214   HGB 14.3 05/01/2019 1331   HCT 42.0 05/01/2019 1331   PLT 370 10/26/2017 1214   MCV 91.8 10/26/2017 1214   MCH 29.7 10/26/2017 1214   MCHC 32.3 10/26/2017 1214   RDW 12.8 10/26/2017 1214   LYMPHSABS 3.5 09/09/2017 1706    MONOABS 0.8 09/09/2017 1706   EOSABS 0.4 09/09/2017 1706   BASOSABS 0.1 09/09/2017 1706    No results found for: POCLITH, LITHIUM   No results found for: PHENYTOIN, PHENOBARB, VALPROATE, CBMZ   .res Assessment: Plan:    Treatment Plan/Recommendations:   Prozac 40mg  daily Wellbutrin XL 150mg  daily  Decrease - Xanax  0.25mg  BID. Plans to taper down as tolerated.  RTC 6 weeks  20 minutes spent dedicated to the care of this patient on the date of this encounter to include pre-visit review of records, ordering of medication, post visit documentation, and face-to-face time with the patient discussing GAD. Discussed medication changes with patient - will plan to reduce Xanax  dose with anxiety improving.    Discussed potential benefits, risk, and side effects of benzodiazepines to include potential risk of tolerance and dependence, as well as possible drowsiness.  Advised patient not to drive if experiencing drowsiness and to take lowest possible effective dose to minimize risk of dependence and tolerance.   Diagnoses and all orders for this visit:  Generalized anxiety disorder     Please see After Visit Summary for patient specific instructions.  Future Appointments  Date Time Provider Department Center  12/28/2023  1:00 PM Sherlynn Sober, LCSW [facility] None    No orders of the defined types were placed in this encounter.     -------------------------------

## 2023-12-23 DIAGNOSIS — J449 Chronic obstructive pulmonary disease, unspecified: Secondary | ICD-10-CM | POA: Diagnosis not present

## 2023-12-28 ENCOUNTER — Ambulatory Visit: Admitting: Psychiatry

## 2023-12-28 DIAGNOSIS — F411 Generalized anxiety disorder: Secondary | ICD-10-CM

## 2023-12-28 NOTE — Progress Notes (Signed)
 [facility] Counselor/Therapist Progress Note  Patient ID: Leah Reeves, MRN: [id number],    Date: 12/28/2023  Time Spent: 53 minutes   Treatment Type: Individual Therapy   Reported Symptoms:  anxiety improving, shakiness has gone, depression improving, worrying decreasing, fears improving   Mental Status Exam:  Appearance:   Casual and Neat     Behavior:  Appropriate, Sharing, and Motivated  Motor:  Normal  Speech/Language:   Clear and Coherent  Affect:  Some anxiety and depression but improving  Mood:  anxious, depressed, and improving  Thought process:  goal directed  Thought content:    WNL  Sensory/Perceptual disturbances:    WNL  Orientation:  oriented to person, place, time/date, situation, day of week, month of year, year, and stated date of Aug. 5, 2025  Attention:  Good  Concentration:  Good  Memory:  WNL  Fund of knowledge:   Good  Insight:    Good and Fair  Judgment:   Good  Impulse Control:  Good   Risk Assessment: Danger to Self:  No Self-injurious Behavior: No Danger to Others: No Duty to Warn:no Physical Aggression / Violence:No  Access to Firearms a concern: No  Gang Involvement:No   Subjective:    Patient today in session showing good motivation but h also had some recent challenges regarding her anxiety and depression. Am quick to be anxious and depressed but I think I'm I am getting better at catching my anxiety or depression a little earlier and trying to interrupt it.  Shared couple of examples that occurred recently for her at home.  Is getting better at interrupting and talking down some of her negative thoughts.  Wants to decrease her overthinking and worked on this some in session today also.  Noticing how she sometimes makes inaccurate assumptions.  Nervousness is worse when this happens.  Over-worrying and trying to interrupt this with some success so far.  Is recognizing how some of her over worrying and overthinking is totally  unnecessary and does nothing to help her in a positive way.  States it had, habit because she felt more protected.  Now starting to realize more that her negative thoughts or fears are assumptions and are not based in reality.  Catching herself and some nervousness and interrupting it realizing the anxious/negative thoughts are not grounded in reality.  Trying to focus more on her ability to turn her thoughts around in her mind and not overly focused on negatives and instead trying to rise above to think and see more positives.  She has only been practicing this for short time but wants to make it more of a habit.  Acknowledges that it is easier for her to understand this and work on this in the privacy of her therapy session but harder for her to do outside of sessions, however, is noticing more progress on her part.  Also working on decreasing people pleasing.  Focusing more on having healthier boundaries and that is feeling more comfortable to her the more she does not.  Also seems to be positively impacting her self-esteem and self talk, although this also is newer behavior for her and I encouraged her to continue.  Interventions: Cognitive Behavioral Therapy, Solution-Oriented/Positive Psychology, and Ego-Supportive 1.Reduce overall level, frequency, and intensity of the anxiety so that daily functioning is not impaired. 2.Verbalize an understanding of the role that fearful thinking plays in creating fears, excessive worry, and persistent anxiety symptoms. 3.Develop behavioral and cognitive stategies to  reduce or eliminate the irrational anxiety.     Diagnosis:   ICD-10-CM   1. Generalized anxiety disorder  F41.1      Plan:   Patient working in session today further on her anxiety,, depression, and beginning to challenge negative and anxious thoughts more effectively.  Working to decrease her negative assumptions, her nervousness, her overall worrying, and beginning to see herself in a more  positive light.  Trying not to overreact to circumstances nor getting stuck in negative thoughts.  Conscientiously working on letting go more of her anxious thoughts.  Continue to encourage her to look for what might go right versus what might go wrong and to notice more of the positives in her life.  Self-esteem gradually improving some.  [facility] is making progress and needs to continue her work on goal-directed behaviors to enable her to move in a healthier and more positive direction into the future.  Will see again within 2 weeks which she feels right now is more helpful for her.  Goal review and progress/challenges noted with patient.  Next appointment within 2 to 3 weeks.   Barnie Bunde, LCSW

## 2024-01-03 ENCOUNTER — Other Ambulatory Visit: Payer: Self-pay | Admitting: Adult Health

## 2024-01-03 DIAGNOSIS — F411 Generalized anxiety disorder: Secondary | ICD-10-CM

## 2024-01-17 ENCOUNTER — Ambulatory Visit: Admitting: Psychiatry

## 2024-01-17 DIAGNOSIS — F411 Generalized anxiety disorder: Secondary | ICD-10-CM

## 2024-01-17 NOTE — Progress Notes (Signed)
 [facility] Counselor/Therapist Progress Note  Patient ID: Leah Reeves, MRN: [id number],    Date: 01/17/2024  Time Spent: 55 minutes   Treatment Type: Individual Therapy  Reported Symptoms: anxiety improving some, not very happy much but not real depressed, some shakiness but not every day, depression improving from  what I was experiencing, worry decreasing, fears lessening, feels being away from our church for 2 yrs hasn't helped me.      Mental Status Exam:  Appearance:   Casual     Behavior:  Appropriate, Sharing, and Motivated  Motor:  Normal  Speech/Language:   Clear and Coherent  Affect:  Depressed and anxious  Mood:  anxious  Thought process:  goal directed  Thought content:    WNL  Sensory/Perceptual disturbances:    WNL  Orientation:  oriented to person, place, time/date, situation, day of week, month of year, year, and stated date of Aug. 25, 2025  Attention:  Good  Concentration:  Good  Memory:  WNL  Fund of knowledge:   Good  Insight:    Good and Fair  Judgment:   Good  Impulse Control:  Good   Risk Assessment: Danger to Self:  No Self-injurious Behavior: No Danger to Others: No Duty to Warn:no Physical Aggression / Violence:No  Access to Firearms a concern: No  Gang Involvement:No   Subjective:  Patient in session and very openly talking through some of her concerns re: perceived weaknesses, down on herself, letting go of the need to control, and making some false assumptions. Assumptions being made that she assumes to be true, and working to recognize the assumptions earlier and how they may be incorrect. Realizing how she easily assumes certain things that may be incorrect. Challenged by her anxiety in certain relationship issues. Working further today on the tendency to assume and interpret things incorrectly, which seemed helpful to patient. Realizing more her and husband's need to be needed as we age.  Seem to be more upbeat and  motivated upon leaving.  Encouraged her to work on decreasing her over-worrying,  over thinking, and she is in agreement.  Trying not to overly focused on negatives and instead being able to see more of the positives.  Working to further decrease her people pleasing, use of healthier boundaries, and more awareness of what seems to impact her self-esteem and negative ways as well as what seems impacted in positive ways.  Interventions: Cognitive Behavioral Therapy, Solution-Oriented/Positive Psychology, and Ego-Supportive 1.Reduce overall level, frequency, and intensity of the anxiety so that daily functioning is not impaired. 2.Verbalize an understanding of the role that fearful thinking plays in creating fears, excessive worry, and persistent anxiety symptoms. 3.Develop behavioral and cognitive stategies to reduce or eliminate the irrational anxiety.     Diagnosis:   ICD-10-CM   1. Generalized anxiety disorder  F41.1      Plan: Patient today actively participating in session and working further on her thoughts assumptions, trying to let go with the need to control, perceived weaknesses, and getting down on herself.  Is recognizing more of her negative assumptions at times and trying to interrupt that more often.  Also working to decrease her over worrying and overthinking.  Feels that her people pleasing has decreased some and wants to continue working on this as well.  Healthier boundaries is a working progress.  Continues working not to overreact to circumstances nor getting stuck in assuming negative thoughts.  Trying to let go more of anxious thoughts  and be looking more for what might go right versus what might go wrong.  Leah Reeves has made progress and needs to continue working with goal-directed behaviors to enable her to keep moving in a healthier and more positive direction going forward.  Will see again within 3 to 4 weeks.  Goal review and progress/challenges noted with  patient.  Next appointment within 2 to 3 weeks.   Barnie Bunde, LCSW

## 2024-01-23 DIAGNOSIS — J449 Chronic obstructive pulmonary disease, unspecified: Secondary | ICD-10-CM | POA: Diagnosis not present

## 2024-01-31 ENCOUNTER — Encounter: Payer: Self-pay | Admitting: Adult Health

## 2024-01-31 ENCOUNTER — Ambulatory Visit (INDEPENDENT_AMBULATORY_CARE_PROVIDER_SITE_OTHER): Admitting: Adult Health

## 2024-01-31 DIAGNOSIS — F411 Generalized anxiety disorder: Secondary | ICD-10-CM

## 2024-01-31 NOTE — Progress Notes (Signed)
 Leah Reeves [id number] Mar 02, 1951 73 y.o.  Virtual Visit via Telephone Note  I connected with pt on 01/31/24 at 10:30 AM EDT by telephone and verified that I am speaking with the correct person using two identifiers.   I discussed the limitations, risks, security and privacy concerns of performing an evaluation and management service by telephone and the availability of in person appointments. I also discussed with the patient that there may be a patient responsible charge related to this service. The patient expressed understanding and agreed to proceed.   I discussed the assessment and treatment plan with the patient. The patient was provided an opportunity to ask questions and all were answered. The patient agreed with the plan and demonstrated an understanding of the instructions.   The patient was advised to call back or seek an in-person evaluation if the symptoms worsen or if the condition fails to improve as anticipated.  I provided 15 minutes of non-face-to-face time during this encounter.  The patient was located at home.  The provider was located at [facility].   Leah Leah Sayers, NP   Subjective:   Patient ID:  Leah Reeves is a 73 y.o. (DOB 09-22-1950) female.  Chief Complaint: No chief complaint on file.   HPI Leah Reeves presents for follow-up of GAD.  Describes mood today as ok. Pleasant. Denies tearfulness. Mood symptoms - reports decreased depression, it's better. Reports improved interest and motivation. Reports decreased anxiety - gets nervous at times. Denies irritability. Denies recent panic attacks. Denies worry, rumination and over thinking. Reports mood as better. Stating I feel like I'm improving. Taking medications as prescribed.  Energy levels stable. Active, but does not have a regular exercise routine.   Enjoys some usual interests and activities. Married. Lives with husband. Extended family local. Spending time with  family. Appetite adequate. Weight stable. Sleeps well most nights. Averages 7 to 8 hours.  Focus and concentration stable. Completing tasks. Managing aspects of household. 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  Denies AH or VH. Denies self harm. Denies substance use.  Previous medication trials: Denies  Review of Systems:  Review of Systems  Musculoskeletal:  Negative for gait problem.  Neurological:  Negative for tremors.  Psychiatric/Behavioral:         Please refer to HPI    Medications: I have reviewed the patient's current medications.  Current Outpatient Medications  Medication Sig Dispense Refill   acetaminophen  (TYLENOL ) 500 MG tablet Take 1 tablet (500 mg total) by mouth every 8 (eight) hours as needed.     ALPRAZolam  (XANAX ) 0.25 MG tablet TAKE 1 TABLET BY MOUTH TWICE  DAILY 60 tablet 1   Ascorbic Acid (VITAMIN C) 500 MG CHEW Chew 500 mg by mouth daily.     buPROPion (WELLBUTRIN XL) 150 MG 24 hr tablet Take 150 mg by mouth every morning.     cholecalciferol (VITAMIN D) 1000 units tablet Take 1,000 Units by mouth daily.     clobetasol  ointment (TEMOVATE ) 0.05 % Apply 1 Application topically 2 (two) times a week. Thin application on affected vulva. 30 g 4   diltiazem  (CARDIZEM ) 30 MG tablet Take 1 tablet (30 mg total) by mouth 4 (four) times daily as needed. 30 tablet 1   ELIQUIS  5 MG TABS tablet TAKE 1 TABLET BY MOUTH TWICE  DAILY 200 tablet 2   FLUoxetine (PROZAC) 40 MG capsule Take 40 mg by mouth every morning.     metoprolol  succinate (TOPROL  XL) 25 MG  24 hr tablet Take 0.5 tablets (12.5 mg total) by mouth daily. 45 tablet 2   nitroGLYCERIN  (NITROSTAT ) 0.4 MG SL tablet Place 0.4 mg under the tongue every 5 (five) minutes as needed for chest pain.     simvastatin  (ZOCOR ) 40 MG tablet Take 1 tablet (40 mg total) by mouth every morning. 90 tablet 1   No current facility-administered medications for this visit.    Medication Side Effects: None  Allergies:  Allergies   Allergen Reactions   Codeine Nausea And Vomiting   Lipitor [Atorvastatin ]     Panic Attack    Azithromycin Rash    Past Medical History:  Diagnosis Date   Anxiety    Arthritis    knees -generalized   Asthma    Atrial fibrillation (HCC)    Bronchitis    Depression    Headache    past history - none at present   Heart murmur    Osteopenia    Perimenopausal vasomotor symptoms    Vertigo    tx. 6 months ago- no problems now,very mild occ.    Family History  Problem Relation Age of Onset   Diabetes Mother    Hypertension Mother    Heart disease Father    Diabetes Sister    Heart disease Sister     Social History   Socioeconomic History   Marital status: Married    Spouse name: Not on file   Number of children: Not on file   Years of education: Not on file   Highest education level: Not on file  Occupational History   Not on file  Tobacco Use   Smoking status: Former    Current packs/day: 0.00    Average packs/day: 0.5 packs/day for 3.0 years (1.5 ttl pk-yrs)    Types: Cigarettes    Start date: 05/25/1988    Quit date: 05/26/1991    Years since quitting: 32.7    Passive exposure: Past   Smokeless tobacco: Never   Tobacco comments:    Former smoker 04/29/22  Vaping Use   Vaping status: Never Used  Substance and Sexual Activity   Alcohol use: Not Currently   Drug use: No   Sexual activity: Yes    Partners: Male    Birth control/protection: Post-menopausal    Comment: 1st intercourse- 23, partners- 3, married- 27 yrs   Other Topics Concern   Not on file  Social History Narrative   Not on file   Social Drivers of Health   Financial Resource Strain: Not on file  Food Insecurity: Not on file  Transportation Needs: Not on file  Physical Activity: Not on file  Stress: Not on file  Social Connections: Not on file  Intimate Partner Violence: Not on file    Past Medical History, Surgical history, Social history, and Family history were reviewed and  updated as appropriate.   Please see review of systems for further details on the patient's review from today.   Objective:   Physical Exam:  LMP 05/25/2008   Physical Exam Constitutional:      General: She is not in acute distress. Musculoskeletal:        General: No deformity.  Neurological:     Mental Status: She is alert and oriented to person, place, and time.     Coordination: Coordination normal.  Psychiatric:        Attention and Perception: Attention and perception normal. She does not perceive auditory or visual hallucinations.  Mood and Affect: Mood normal. Mood is not anxious or depressed. Affect is not labile, blunt, angry or inappropriate.        Speech: Speech normal.        Behavior: Behavior normal.        Thought Content: Thought content normal. Thought content is not paranoid or delusional. Thought content does not include homicidal or suicidal ideation. Thought content does not include homicidal or suicidal plan.        Cognition and Memory: Cognition and memory normal.        Judgment: Judgment normal.     Comments: Insight intact     Lab Review:     Component Value Date/Time   NA 140 05/01/2019 1331   K 4.1 05/01/2019 1331   CL 106 05/01/2019 1331   CO2 24 10/26/2017 1214   GLUCOSE 114 (H) 05/01/2019 1331   BUN 12 05/01/2019 1331   CREATININE 0.70 05/01/2019 1331   CALCIUM  9.5 10/26/2017 1214   GFRNONAA >60 10/26/2017 1214   GFRAA >60 10/26/2017 1214       Component Value Date/Time   WBC 9.1 10/26/2017 1214   RBC 5.15 (H) 10/26/2017 1214   HGB 14.3 05/01/2019 1331   HCT 42.0 05/01/2019 1331   PLT 370 10/26/2017 1214   MCV 91.8 10/26/2017 1214   MCH 29.7 10/26/2017 1214   MCHC 32.3 10/26/2017 1214   RDW 12.8 10/26/2017 1214   LYMPHSABS 3.5 09/09/2017 1706   MONOABS 0.8 09/09/2017 1706   EOSABS 0.4 09/09/2017 1706   BASOSABS 0.1 09/09/2017 1706    No results found for: POCLITH, LITHIUM   No results found for: PHENYTOIN,  PHENOBARB, VALPROATE, CBMZ   .res Assessment: Plan:    Treatment Plan/Recommendations:   Prozac 40mg  daily Wellbutrin XL 150mg  daily  Decrease - Xanax  0.25mg  BID. Plans to taper down as tolerated.  RTC 4 weeks  15 minutes spent dedicated to the care of this patient on the date of this encounter to include pre-visit review of records, ordering of medication, post visit documentation, and face-to-face time with the patient discussing GAD. Discussed medication changes with patient - will plan to reduce Xanax  dose with anxiety improving.    Discussed potential benefits, risk, and side effects of benzodiazepines to include potential risk of tolerance and dependence, as well as possible drowsiness.  Advised patient not to drive if experiencing drowsiness and to take lowest possible effective dose to minimize risk of dependence and tolerance.   There are no diagnoses linked to this encounter.  Please see After Visit Summary for patient specific instructions.  Future Appointments  Date Time Provider Department Center  01/31/2024 10:30 AM Mariah Harn, Leah Mattocks, NP [facility] None  02/24/2024 10:00 AM Sherlynn Sober, LCSW [facility] None    No orders of the defined types were placed in this encounter.     -------------------------------

## 2024-02-08 DIAGNOSIS — H47323 Drusen of optic disc, bilateral: Secondary | ICD-10-CM | POA: Diagnosis not present

## 2024-02-08 DIAGNOSIS — H2513 Age-related nuclear cataract, bilateral: Secondary | ICD-10-CM | POA: Diagnosis not present

## 2024-02-08 DIAGNOSIS — H18413 Arcus senilis, bilateral: Secondary | ICD-10-CM | POA: Diagnosis not present

## 2024-02-08 DIAGNOSIS — H2512 Age-related nuclear cataract, left eye: Secondary | ICD-10-CM | POA: Diagnosis not present

## 2024-02-08 DIAGNOSIS — H25043 Posterior subcapsular polar age-related cataract, bilateral: Secondary | ICD-10-CM | POA: Diagnosis not present

## 2024-02-09 ENCOUNTER — Telehealth: Payer: Self-pay

## 2024-02-09 ENCOUNTER — Telehealth: Payer: Self-pay | Admitting: Adult Health

## 2024-02-09 NOTE — Telephone Encounter (Signed)
   Pre-operative Risk Assessment    Patient Name: Leah Reeves  DOB: 01-18-1951 MRN: [id number] Date of last office visit: 12/01/23  Leotis Barrack, PA Date of next office visit: None   Request for Surgical Clearance    Procedure:  Cataract extraction with Intraocular lens implantation of the Left Eye followed by the Right Eye Date of Surgery:  Clearance 05/01/24 and 05/22/24                               Surgeon:  Not Indicated Surgeon's Group or Practice Name:  [facility] Phone number:  [phone] Fax number:  [phone] Type of Clearance Requested:   - Medical  - Pharmacy:  Hold Eliquis   per surgeon's request, pt does not have to stop any meds   Type of Anesthesia:  Topical anesthesia with IV medication   Additional requests/questions:    Bonney Arlyne LITTIE Kallie   02/09/2024, 3:25 PM

## 2024-02-09 NOTE — Telephone Encounter (Signed)
 Pt lvm that she is having some issues with her medication. She thinks she needs an increase. She is going on vacation next week. She said that she is very nervous. Please call her at [phone]

## 2024-02-09 NOTE — Telephone Encounter (Signed)
   Patient Name: LILU MCGLOWN  DOB: 05-16-1951 MRN: [id number]  Primary Cardiologist: Oneil Parchment, MD  Chart reviewed as part of pre-operative protocol coverage. Cataract extractions are recognized in guidelines as low risk surgeries that do not typically require specific preoperative testing or holding of blood thinner therapy. Therefore, given past medical history and time since last visit, based on ACC/AHA guidelines, SABREE NUON would be at acceptable risk for the planned procedure without further cardiovascular testing.   I will route this recommendation to the requesting party via Epic fax function and remove from pre-op pool.  Please call with questions.  Worley Radermacher E Karel Turpen, NP 02/09/2024, 3:52 PM

## 2024-02-10 NOTE — Telephone Encounter (Signed)
 LVM to [facility]

## 2024-02-10 NOTE — Telephone Encounter (Signed)
 Called patient again. She said she had called back to disregard the message, reported that she was just having a bad day yesterday and is doing much better today. Nothing needed at this time.

## 2024-02-22 DIAGNOSIS — J449 Chronic obstructive pulmonary disease, unspecified: Secondary | ICD-10-CM | POA: Diagnosis not present

## 2024-02-24 ENCOUNTER — Ambulatory Visit: Admitting: Psychiatry

## 2024-02-24 DIAGNOSIS — F411 Generalized anxiety disorder: Secondary | ICD-10-CM

## 2024-02-24 NOTE — Progress Notes (Signed)
      [facility] Counselor/Therapist Progress Note  Patient ID: Leah Reeves, MRN: [id number],    Date: 02/24/2024  Time Spent: 52 minutes   Treatment Type: Individual Therapy  Reported Symptoms: anxiety continuing to improve and has been able to decrease her Xanax  with help of med provider, felt happier at times, depression still improving, less worrying, less fears, some nervousness when I wake up, wanting to get back into her church or a church somewhere    Mental Status Exam:  Appearance:   Casual and Neat     Behavior:  Appropriate and Sharing  Motor:  Normal  Speech/Language:   Clear and Coherent  Affect:  anxiety  Mood:  anxious  Thought process:  goal directed  Thought content:    Rumination  Sensory/Perceptual disturbances:    WNL  Orientation:  oriented to person, place, time/date, situation, day of week, month of year, year, and stated date of Oct. 2, 2025  Attention:  Good  Concentration:  Good  Memory:  WNL  Fund of knowledge:   Good  Insight:    Good  Judgment:   Good  Impulse Control:  Good   Risk Assessment: Danger to Self:  No Self-injurious Behavior: No Danger to Others: No Duty to Warn:no Physical Aggression / Violence:No  Access to Firearms a concern: No  Gang Involvement:No   Subjective:  Patient today talking and sharing openly about her progress and some current stressors but I am improving gradually and is believing more in herself. Much less perceived weaknesses , not as down on herself, and working to not assume as much especially in a negative direction, and letting go of the need to control. Still working to not assume everything. Still some overthinking and over-worrying but I am improving some gradually and continue to work on my goals.  Aware of her need to be needed and discussed this more in session today. Believing more in herself and very motivated on her treatment goals. To continue her work on decreasing her  worrying/over-worrying, overthinking, and noting her progress. Looking for the positives versus negatives. People pleasing decreasing, trying to maintain healthier boundaries.   Interventions: Cognitive Behavioral Therapy, Solution-Oriented/Positive Psychology, and Ego-Supportive 1.Reduce overall level, frequency, and intensity of the anxiety so that daily functioning is not impaired. 2.Verbalize an understanding of the role that fearful thinking plays in creating fears, excessive worry, and persistent anxiety symptoms. 3.Develop behavioral and cognitive stategies to reduce or eliminate the irrational anxiety.     Diagnosis:   ICD-10-CM   1. Generalized anxiety disorder  F41.1      Plan: Patient working well on her goals related to making a lot of assumptions which are often not necessarily accurate, working on her control issues and perceived weaknesses and trying to let go of these realizing they are not based in reality.  People-pleasing is decreasing. Anxious thoughts decreasing. [facility] continues to make progress and needs to keep working with goal-directed behaviors so as to enable her to keep moving in an more positive direction going forward.  Will see patient again in 4-6 weeks.  Goal review and progress/challenges noted with patient.  Next appointment within 2 to 3 weeks.   Barnie Bunde, LCSW

## 2024-02-28 DIAGNOSIS — I480 Paroxysmal atrial fibrillation: Secondary | ICD-10-CM | POA: Diagnosis not present

## 2024-02-28 DIAGNOSIS — Z23 Encounter for immunization: Secondary | ICD-10-CM | POA: Diagnosis not present

## 2024-02-28 DIAGNOSIS — E785 Hyperlipidemia, unspecified: Secondary | ICD-10-CM | POA: Diagnosis not present

## 2024-02-28 DIAGNOSIS — Z0000 Encounter for general adult medical examination without abnormal findings: Secondary | ICD-10-CM | POA: Diagnosis not present

## 2024-02-28 DIAGNOSIS — J449 Chronic obstructive pulmonary disease, unspecified: Secondary | ICD-10-CM | POA: Diagnosis not present

## 2024-02-28 DIAGNOSIS — M85859 Other specified disorders of bone density and structure, unspecified thigh: Secondary | ICD-10-CM | POA: Diagnosis not present

## 2024-02-29 ENCOUNTER — Telehealth: Admitting: Adult Health

## 2024-03-01 ENCOUNTER — Other Ambulatory Visit: Payer: Self-pay | Admitting: Internal Medicine

## 2024-03-01 DIAGNOSIS — Z1231 Encounter for screening mammogram for malignant neoplasm of breast: Secondary | ICD-10-CM

## 2024-03-03 ENCOUNTER — Ambulatory Visit
Admission: RE | Admit: 2024-03-03 | Discharge: 2024-03-03 | Disposition: A | Discharge status: Home / Self Care | Source: Ambulatory Visit | Attending: Internal Medicine | Admitting: Internal Medicine

## 2024-03-03 DIAGNOSIS — Z1231 Encounter for screening mammogram for malignant neoplasm of breast: Secondary | ICD-10-CM

## 2024-03-08 ENCOUNTER — Encounter: Payer: Self-pay | Admitting: Adult Health

## 2024-03-08 ENCOUNTER — Ambulatory Visit: Admitting: Adult Health

## 2024-03-08 DIAGNOSIS — F411 Generalized anxiety disorder: Secondary | ICD-10-CM | POA: Diagnosis not present

## 2024-03-08 MED ORDER — ALPRAZOLAM 0.25 MG PO TABS: 0.2500 mg | ORAL_TABLET | Freq: Two times a day (BID) | ORAL | 2 refills | Status: DC

## 2024-03-08 NOTE — Progress Notes (Signed)
 Leah Reeves [id number] [birth year] 73 y.o.  Subjective:   Patient ID:  Leah Reeves is a 73 y.o. (DOB 05/14/1951) female.  Chief Complaint: No chief complaint on file.   HPI Leah Reeves presents to the office today for follow-up of GAD.  Describes mood today as ok. Pleasant. Denies tearfulness. Mood symptoms - reports depression over the past few weeks. Reports improved interest and motivation. Reports situational anxiety. Denies irritability. Reports one recent panic attack - involving a cup. Reports worry, rumination and over thinking.  Reports mood as lower. Stating I feel like I was doing better, then I wasn't. Taking medications as prescribed.  Energy levels stable. Active, but does not have a regular exercise routine, but stays active.   Enjoys some usual interests and activities. Married. Lives with husband. Extended family local. Spending time with family. Appetite adequate. Weight loss 10 pounds over the past year. Sleeps well most nights. Averages 7 to 8 hours.  Focus and concentration stable. Completing tasks. Managing aspects of household. 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  Denies AH or VH. Denies self harm. Denies substance use.  Previous medication trials: Denies   Flowsheet Row Admission (Discharged) from 09/02/2023 in [facility] C[facility] ED from 06/25/2023 in [facility] Department at [facility]  C-SSRS RISK CATEGORY No Risk No Risk     Review of Systems:  Review of Systems  Musculoskeletal:  Negative for gait problem.  Neurological:  Negative for tremors.  Psychiatric/Behavioral:         Please refer to HPI    Medications: I have reviewed the patient's current medications.  Current Outpatient Medications  Medication Sig Dispense Refill   acetaminophen  (TYLENOL ) 500 MG tablet Take 1 tablet (500 mg total) by mouth every 8 (eight) hours as needed.     ALPRAZolam  (XANAX ) 0.25 MG tablet TAKE 1 TABLET  BY MOUTH TWICE  DAILY 60 tablet 1   Ascorbic Acid (VITAMIN C) 500 MG CHEW Chew 500 mg by mouth daily.     buPROPion (WELLBUTRIN XL) 150 MG 24 hr tablet Take 150 mg by mouth every morning.     cholecalciferol (VITAMIN D) 1000 units tablet Take 1,000 Units by mouth daily.     clobetasol  ointment (TEMOVATE ) 0.05 % Apply 1 Application topically 2 (two) times a week. Thin application on affected vulva. 30 g 4   diltiazem  (CARDIZEM ) 30 MG tablet Take 1 tablet (30 mg total) by mouth 4 (four) times daily as needed. 30 tablet 1   ELIQUIS  5 MG TABS tablet TAKE 1 TABLET BY MOUTH TWICE  DAILY 200 tablet 2   FLUoxetine (PROZAC) 40 MG capsule Take 40 mg by mouth every morning.     metoprolol  succinate (TOPROL  XL) 25 MG 24 hr tablet Take 0.5 tablets (12.5 mg total) by mouth daily. 45 tablet 2   nitroGLYCERIN  (NITROSTAT ) 0.4 MG SL tablet Place 0.4 mg under the tongue every 5 (five) minutes as needed for chest pain.     simvastatin  (ZOCOR ) 40 MG tablet Take 1 tablet (40 mg total) by mouth every morning. 90 tablet 1   No current facility-administered medications for this visit.    Medication Side Effects: None  Allergies:  Allergies  Allergen Reactions   Codeine Nausea And Vomiting   Lipitor [Atorvastatin ]     Panic Attack    Azithromycin Rash    Past Medical History:  Diagnosis Date   Anxiety    Arthritis    knees -  generalized   Asthma    Atrial fibrillation (HCC)    Bronchitis    Depression    Headache    past history - none at present   Heart murmur    Osteopenia    Perimenopausal vasomotor symptoms    Vertigo    tx. 6 months ago- no problems now,very mild occ.    Past Medical History, Surgical history, Social history, and Family history were reviewed and updated as appropriate.   Please see review of systems for further details on the patient's review from today.   Objective:   Physical Exam:  LMP 05/25/2008   Physical Exam Constitutional:      General: She is not in acute  distress. Musculoskeletal:        General: No deformity.  Neurological:     Mental Status: She is alert and oriented to person, place, and time.     Coordination: Coordination normal.  Psychiatric:        Attention and Perception: Attention and perception normal. She does not perceive auditory or visual hallucinations.        Mood and Affect: Mood normal. Mood is not anxious or depressed. Affect is not labile, blunt, angry or inappropriate.        Speech: Speech normal.        Behavior: Behavior normal.        Thought Content: Thought content normal. Thought content is not paranoid or delusional. Thought content does not include homicidal or suicidal ideation. Thought content does not include homicidal or suicidal plan.        Cognition and Memory: Cognition and memory normal.        Judgment: Judgment normal.     Comments: Insight intact     Lab Review:     Component Value Date/Time   NA 140 05/01/2019 1331   K 4.1 05/01/2019 1331   CL 106 05/01/2019 1331   CO2 24 10/26/2017 1214   GLUCOSE 114 (H) 05/01/2019 1331   BUN 12 05/01/2019 1331   CREATININE 0.70 05/01/2019 1331   CALCIUM  9.5 10/26/2017 1214   GFRNONAA >60 10/26/2017 1214   GFRAA >60 10/26/2017 1214       Component Value Date/Time   WBC 9.1 10/26/2017 1214   RBC 5.15 (H) 10/26/2017 1214   HGB 14.3 05/01/2019 1331   HCT 42.0 05/01/2019 1331   PLT 370 10/26/2017 1214   MCV 91.8 10/26/2017 1214   MCH 29.7 10/26/2017 1214   MCHC 32.3 10/26/2017 1214   RDW 12.8 10/26/2017 1214   LYMPHSABS 3.5 09/09/2017 1706   MONOABS 0.8 09/09/2017 1706   EOSABS 0.4 09/09/2017 1706   BASOSABS 0.1 09/09/2017 1706    No results found for: POCLITH, LITHIUM   No results found for: PHENYTOIN, PHENOBARB, VALPROATE, CBMZ   .res Assessment: Plan:    Treatment Plan/Recommendations:   Prozac 40mg  daily Wellbutrin XL 150mg  daily  Increase - Xanax  0.125mg  back to 0.25mg  BID. Plans to taper down the first of the  year.  RTC 4 weeks  15 minutes spent dedicated to the care of this patient on the date of this encounter to include pre-visit review of records, ordering of medication, post visit documentation, and face-to-face time with the patient discussing GAD. Discussed continuing Xanax  until after holidays.  Discussed potential benefits, risk, and side effects of benzodiazepines to include potential risk of tolerance and dependence, as well as possible drowsiness.  Advised patient not to drive if experiencing drowsiness and to take lowest  possible effective dose to minimize risk of dependence and tolerance.   There are no diagnoses linked to this encounter.   Please see After Visit Summary for patient specific instructions.  Future Appointments  Date Time Provider Department Center  04/06/2024 10:00 AM Sherlynn Sober, LCSW [facility] None    No orders of the defined types were placed in this encounter.   -------------------------------

## 2024-03-10 ENCOUNTER — Other Ambulatory Visit (HOSPITAL_COMMUNITY): Payer: Self-pay

## 2024-03-16 ENCOUNTER — Ambulatory Visit: Admitting: Psychiatry

## 2024-03-16 DIAGNOSIS — F411 Generalized anxiety disorder: Secondary | ICD-10-CM

## 2024-03-16 NOTE — Progress Notes (Signed)
 [facility] Counselor/Therapist Progress Note  Patient ID: Leah Reeves, MRN: [id number],    Date: 03/16/2024  Time Spent: 55 minutes   Treatment Type: Individual Therapy  Reported Symptoms: anxiety, less consistent improvement, Xanax  does help, depression, I've felt this way 2 weeks, lethargic but [place], husband supportive, considering returning to their prior church where there have been some issues    Mental Status Exam:  Appearance:   Neat     Behavior:  Appropriate, Sharing, and Motivated  Motor:  Normal  Speech/Language:   Clear and Coherent  Affect:  Depressed and anxious  Mood:  anxious and depressed  Thought process:  goal directed  Thought content:    Rumination  Sensory/Perceptual disturbances:    WNL  Orientation:  oriented to person, place, time/date, situation, day of week, month of year, year, and stated date of Oct. 23, 2025  Attention:  Good  Concentration:  Good  Memory:  WNL  Fund of knowledge:   Good  Insight:    Fair  Judgment:   Good  Impulse Control:  Good   Risk Assessment: Danger to Self:  No Self-injurious Behavior: No Danger to Others: No Duty to Warn:no Physical Aggression / Violence:No  Access to Firearms a concern: No  Gang Involvement:No   Subjective:    Patient in for appointment today and reporting symptoms of anxiety, some depression, fears, worrying, and nervousness upon awakening. But I'm not really thinking nor feeling anxious , but later feels nervous . Talking through situations as to what she feels helps or aggravates her anxiety and some depression. Trying to have more time with husband and other family. Wanting to get back in their church possibly, as some things are uncertain. (Not all details included in this note due to patient privacy needs.) Processing a lot of her worries and anxieties today and talked through multiple strategies that patient can use anywhere, which seemed helpful to  her.  Trying to believe more in herself and she seems to be making some gains in that area.  Worked really well in session today, very active.  Less perceived weakness and not as critical of herself, still working on letting go of the need to control.  Trying not to assume that  things will go in a negative direction.  Feels that she is gradually improving with some of her overthinking and over worrying but this too is a work in progress.  Her motivation seemed better by end of session.  Agrees to also work on her overthinking daily as we discussed examples in session today.  Interventions: Solution-Oriented/Positive Psychology and Ego-Supportive 1.Reduce overall level, frequency, and intensity of the anxiety so that daily functioning is not impaired. 2.Verbalize an understanding of the role that fearful thinking plays in creating fears, excessive worry, and persistent anxiety symptoms. 3.Develop behavioral and cognitive stategies to reduce or eliminate the irrational anxiety.    Diagnosis:   ICD-10-CM   1. Generalized anxiety disorder  F41.1       Plan:   Patient today in session focusing and working well on her goals especially some of her anxiety issues and her tendency to make fearful or negative assumptions which often are not accurate, and tend to aggravate some of her other symptoms.  Continues her work on control issues and her review of her perceived weaknesses and trying to see that these issues are not rooted in reality.  Does feel that some of  her anxious thoughts are decreasing along with her people pleasing behaviors which are also decreasing per her report.  [facility] does continue to make progress and needs to continue working with her goal-directed behaviors that help enable her to keep moving forward in a positive and more hopeful direction.  Will see patient again within 3 to 4 weeks.  Goal review and progress/challenges noted with patient.  Next appointment within 2 to 3  weeks.   Barnie Bunde, LCSW

## 2024-03-17 DIAGNOSIS — M810 Age-related osteoporosis without current pathological fracture: Secondary | ICD-10-CM | POA: Diagnosis not present

## 2024-03-21 DIAGNOSIS — Z23 Encounter for immunization: Secondary | ICD-10-CM | POA: Diagnosis not present

## 2024-03-30 ENCOUNTER — Encounter: Payer: Self-pay | Admitting: Adult Health

## 2024-03-30 ENCOUNTER — Telehealth (INDEPENDENT_AMBULATORY_CARE_PROVIDER_SITE_OTHER): Admitting: Adult Health

## 2024-03-30 DIAGNOSIS — F411 Generalized anxiety disorder: Secondary | ICD-10-CM

## 2024-03-30 MED ORDER — ALPRAZOLAM 0.25 MG PO TABS: ORAL_TABLET | ORAL | 2 refills | Status: DC

## 2024-03-30 NOTE — Progress Notes (Addendum)
 Leah Reeves [id number] 06/28/1950 73 y.o.  Virtual Visit via Video Note  I connected with pt @ on 03/30/24 at 10:00 AM EST by a video enabled telemedicine application and verified that I am speaking with the correct person using two identifiers.   I discussed the limitations of evaluation and management by telemedicine and the availability of in person appointments. The patient expressed understanding and agreed to proceed.  I discussed the assessment and treatment plan with the patient. The patient was provided an opportunity to ask questions and all were answered. The patient agreed with the plan and demonstrated an understanding of the instructions.   The patient was advised to call back or seek an in-person evaluation if the symptoms worsen or if the condition fails to improve as anticipated.  I provided 25 minutes of non-face-to-face time during this encounter.  The patient was located at home.  The provider was located at [facility].   Leah LOISE Sayers, NP   Subjective:   Patient ID:  Leah Reeves is a 73 y.o. (DOB [birth year]) female.  Chief Complaint: No chief complaint on file.   HPI Leah Reeves presents for follow-up of GAD.  Describes mood today as ok. Pleasant. Denies tearfulness. Mood symptoms - denies depression and irritability. Reports stable interest and motivation. Reports situational anxiety. Denies recent panic attacks. Reports some worry, rumination and over thinking. Reports mood has improved some. Stating I feel like I'm doing better, but not back to where I was. Taking medications as prescribed.  Energy levels stable. Active, but does not have a regular exercise routine, but stays active.   Enjoys some usual interests and activities. Married. Lives with husband. Extended family local. Spending time with family. Appetite adequate. Reports weight loss. Sleeps well most nights. Averages 7 to 8 hours.  Focus and concentration stable.  Completing tasks. Managing aspects of household. Retired - photographer. Denies SI or HI.  Denies AH or VH. Denies self harm. Denies substance use.  Previous medication trials: Denies  Review of Systems:  Review of Systems  Musculoskeletal:  Negative for gait problem.  Neurological:  Negative for tremors.  Psychiatric/Behavioral:         Please refer to HPI    Medications: I have reviewed the patient's current medications.  Current Outpatient Medications  Medication Sig Dispense Refill   acetaminophen  (TYLENOL ) 500 MG tablet Take 1 tablet (500 mg total) by mouth every 8 (eight) hours as needed.     ALPRAZolam  (XANAX ) 0.25 MG tablet Take 1 tablet (0.25 mg total) by mouth 2 (two) times daily. 60 tablet 2   Ascorbic Acid (VITAMIN C) 500 MG CHEW Chew 500 mg by mouth daily.     buPROPion (WELLBUTRIN XL) 150 MG 24 hr tablet Take 150 mg by mouth every morning.     cholecalciferol (VITAMIN D) 1000 units tablet Take 1,000 Units by mouth daily.     clobetasol  ointment (TEMOVATE ) 0.05 % Apply 1 Application topically 2 (two) times a week. Thin application on affected vulva. 30 g 4   diltiazem  (CARDIZEM ) 30 MG tablet Take 1 tablet (30 mg total) by mouth 4 (four) times daily as needed. 30 tablet 1   ELIQUIS  5 MG TABS tablet TAKE 1 TABLET BY MOUTH TWICE  DAILY 200 tablet 2   FLUoxetine (PROZAC) 40 MG capsule Take 40 mg by mouth every morning.     metoprolol  succinate (TOPROL  XL) 25 MG 24 hr tablet Take 0.5 tablets (12.5 mg total) by mouth daily.  45 tablet 2   nitroGLYCERIN  (NITROSTAT ) 0.4 MG SL tablet Place 0.4 mg under the tongue every 5 (five) minutes as needed for chest pain.     simvastatin  (ZOCOR ) 40 MG tablet Take 1 tablet (40 mg total) by mouth every morning. 90 tablet 1   No current facility-administered medications for this visit.    Medication Side Effects: None  Allergies:  Allergies  Allergen Reactions   Codeine Nausea And Vomiting   Lipitor [Atorvastatin ]     Panic Attack     Azithromycin Rash    Past Medical History:  Diagnosis Date   Anxiety    Arthritis    knees -generalized   Asthma    Atrial fibrillation (HCC)    Bronchitis    Depression    Headache    past history - none at present   Heart murmur    Osteopenia    Perimenopausal vasomotor symptoms    Vertigo    tx. 6 months ago- no problems now,very mild occ.    Family History  Problem Relation Age of Onset   Diabetes Mother    Hypertension Mother    Heart disease Father    Diabetes Sister    Heart disease Sister    Breast cancer Neg Hx     Social History   Socioeconomic History   Marital status: Married    Spouse name: Not on file   Number of children: Not on file   Years of education: Not on file   Highest education level: Not on file  Occupational History   Not on file  Tobacco Use   Smoking status: Former    Current packs/day: 0.00    Average packs/day: 0.5 packs/day for 3.0 years (1.5 ttl pk-yrs)    Types: Cigarettes    Start date: 05/25/1988    Quit date: 05/26/1991    Years since quitting: 32.8    Passive exposure: Past   Smokeless tobacco: Never   Tobacco comments:    Former smoker 04/29/22  Vaping Use   Vaping status: Never Used  Substance and Sexual Activity   Alcohol use: Not Currently   Drug use: No   Sexual activity: Yes    Partners: Male    Birth control/protection: Post-menopausal    Comment: 1st intercourse- 23, partners- 3, married- 27 yrs   Other Topics Concern   Not on file  Social History Narrative   Not on file   Social Drivers of Health   Financial Resource Strain: Not on file  Food Insecurity: Not on file  Transportation Needs: Not on file  Physical Activity: Not on file  Stress: Not on file  Social Connections: Not on file  Intimate Partner Violence: Not on file    Past Medical History, Surgical history, Social history, and Family history were reviewed and updated as appropriate.   Please see review of systems for further details on  the patient's review from today.   Objective:   Physical Exam:  LMP 05/25/2008   Physical Exam Constitutional:      General: She is not in acute distress. Musculoskeletal:        General: No deformity.  Neurological:     Mental Status: She is alert and oriented to person, place, and time.     Coordination: Coordination normal.  Psychiatric:        Attention and Perception: Attention and perception normal. She does not perceive auditory or visual hallucinations.        Mood and Affect:  Mood normal. Mood is not anxious or depressed. Affect is not labile, blunt, angry or inappropriate.        Speech: Speech normal.        Behavior: Behavior normal.        Thought Content: Thought content normal. Thought content is not paranoid or delusional. Thought content does not include homicidal or suicidal ideation. Thought content does not include homicidal or suicidal plan.        Cognition and Memory: Cognition and memory normal.        Judgment: Judgment normal.     Comments: Insight intact     Lab Review:     Component Value Date/Time   NA 140 05/01/2019 1331   K 4.1 05/01/2019 1331   CL 106 05/01/2019 1331   CO2 24 10/26/2017 1214   GLUCOSE 114 (H) 05/01/2019 1331   BUN 12 05/01/2019 1331   CREATININE 0.70 05/01/2019 1331   CALCIUM  9.5 10/26/2017 1214   GFRNONAA >60 10/26/2017 1214   GFRAA >60 10/26/2017 1214       Component Value Date/Time   WBC 9.1 10/26/2017 1214   RBC 5.15 (H) 10/26/2017 1214   HGB 14.3 05/01/2019 1331   HCT 42.0 05/01/2019 1331   PLT 370 10/26/2017 1214   MCV 91.8 10/26/2017 1214   MCH 29.7 10/26/2017 1214   MCHC 32.3 10/26/2017 1214   RDW 12.8 10/26/2017 1214   LYMPHSABS 3.5 09/09/2017 1706   MONOABS 0.8 09/09/2017 1706   EOSABS 0.4 09/09/2017 1706   BASOSABS 0.1 09/09/2017 1706    No results found for: POCLITH, LITHIUM   No results found for: PHENYTOIN, PHENOBARB, VALPROATE, CBMZ   .res Assessment: Plan:    Treatment  Plan/Recommendations:   Prozac 40mg  daily Wellbutrin XL 150mg  daily  Increase Xanax  0.25mg  BID and a 0.125 as needed up to 10 days a month.   RTC 2 weeks  25 minutes spent dedicated to the care of this patient on the date of this encounter to include pre-visit review of records, ordering of medication, post visit documentation, and face-to-face time with the patient discussing GAD. Discussed continuing current medication regimen.  Discussed potential benefits, risk, and side effects of benzodiazepines to include potential risk of tolerance and dependence, as well as possible drowsiness.  Advised patient not to drive if experiencing drowsiness and to take lowest possible effective dose to minimize risk of dependence and tolerance.   There are no diagnoses linked to this encounter.   Please see After Visit Summary for patient specific instructions.  Future Appointments  Date Time Provider Department Center  04/04/2024  9:00 AM Sherlynn Sober, LCSW [facility] None    No orders of the defined types were placed in this encounter.     -------------------------------

## 2024-04-04 ENCOUNTER — Ambulatory Visit: Admitting: Psychiatry

## 2024-04-04 DIAGNOSIS — F411 Generalized anxiety disorder: Secondary | ICD-10-CM

## 2024-04-04 NOTE — Progress Notes (Signed)
 [facility] Counselor/Therapist Progress Note  Patient ID: Leah Reeves, MRN: [id number],    Date: 04/04/2024  Time Spent: 55 minutes   Treatment Type: Individual Therapy  Reported Symptoms: anxiety, some depression, jitteriness and wakes up in the morning shaking and comes and goes throughout the day and jitteriness happens more when I'm relaxed or reading or watching TV   Mental Status Exam:  Appearance:   Casual and Neat     Behavior:  Appropriate, Sharing, and Motivated  Motor:  Normal  Speech/Language:   Clear and Coherent  Affect:  anxious  Mood:  anxious  Thought process:  goal directed  Thought content:    Less rumination reported  Sensory/Perceptual disturbances:    WNL  Orientation:  oriented to person, place, time/date, situation, day of week, month of year, year, and stated date of Nov. 11, 2025  Attention:  Good  Concentration:  Good  Memory:  WNL  Fund of knowledge:   Good  Insight:    Good and Fair  Judgment:   Good  Impulse Control:  Good   Risk Assessment: Danger to Self:  No Self-injurious Behavior: No Danger to Others: No Duty to Warn:no Physical Aggression / Violence:No  Access to Firearms a concern: No  Gang Involvement:No   Subjective:  Patient in for her appointment today and reporting symptoms of anxiety, worrying, some depression, fears, and nervousness upon awakening? States she's had this off and on feeling of her shakiness not stopping, but it does stop at times. Not sure what stops it nor what starts it. But does usually ease off with medication. Discussing recent stressors including daughter-in-law having some health issues, and undergoing testing but states she doesn't feel that is contributing to her stress and anxiety. Checked in with patient's med provider, Angeline Sayers DNP, and patient can take a half of her Xanax  in between other 2 doses as prescribed. Patient to see med provider next week.  Patient needed most of session to  talk through her anxiety and some situations that therapist felt might be contributing to it, but also her tendency to focus a lot on her anxiety and whether or not she will be anxious in certain situations.  Worked more on this today and patient responded well.  Did process some of her worries and anxieties today as time allowed and talking through them seem to help some.  Patient is definitely not wanting to overuse or abuse medication.  Trying to believe more in herself and less perceptions of herself being weak.  Also working on trying not to assume that things will go in a negative direction.  Working to decrease her overthinking and worrying as she has made progress at times.  To follow through and strategies worked on in session today and seemed to feel more confident upon leaving.  Interventions: Cognitive Behavioral Therapy 1.Reduce overall level, frequency, and intensity of the anxiety so that daily functioning is not impaired. 2.Verbalize an understanding of the role that fearful thinking plays in creating fears, excessive worry, and persistent anxiety symptoms. 3.Develop behavioral and cognitive stategies to reduce or eliminate the irrational anxiety.   Diagnosis:   ICD-10-CM   1. Generalized anxiety disorder  F41.1      Plan:   Patient today working in session further on her goals particularly reducing her level of anxiety and at times self-doubt, fearful and negative assumptions which often are not accurate and tend to occur more when she is anxious.  Also  worked on some of her perceived weaknesses.  Today was able to describe how her anxiety seemed to have increased some.  Therapist reached out to med provider and patient will also see med provider next week.  Patient continues to work on control issues and does feel that some of those issues are decreasing and that she is doing less people pleasing.  [facility] continues to make progress although currently stressing with some  frequent anxiety which was addressed in session today.  Working with goal-directed behaviors that are helping to enable her to keep moving forward in a positive and more hopeful direction.  To see again within 2-3 weeks.  Goal review and progress/challenges noted with patient.  Next appointment within 2 to 3 weeks.   Barnie Bunde, LCSW

## 2024-04-06 ENCOUNTER — Ambulatory Visit: Admitting: Psychiatry

## 2024-04-12 ENCOUNTER — Ambulatory Visit: Admitting: Adult Health

## 2024-04-12 ENCOUNTER — Encounter: Payer: Self-pay | Admitting: Adult Health

## 2024-04-12 DIAGNOSIS — F411 Generalized anxiety disorder: Secondary | ICD-10-CM

## 2024-04-12 MED ORDER — ALPRAZOLAM 0.25 MG PO TABS: ORAL_TABLET | ORAL | 2 refills | Status: DC

## 2024-04-12 MED ORDER — BUSPIRONE HCL 5 MG PO TABS: 5.0000 mg | ORAL_TABLET | Freq: Three times a day (TID) | ORAL | 2 refills | Status: DC

## 2024-04-12 MED ORDER — FLUOXETINE HCL 40 MG PO CAPS: 40.0000 mg | ORAL_CAPSULE | Freq: Every morning | ORAL | 1 refills | Status: AC

## 2024-04-12 MED ORDER — BUPROPION HCL ER (XL) 150 MG PO TB24: 150.0000 mg | ORAL_TABLET | Freq: Every morning | ORAL | 1 refills | Status: AC

## 2024-04-12 NOTE — Progress Notes (Signed)
 CARISMA TROUPE [id number] 1950/10/16 73 y.o.  Subjective:   Patient ID:  Leah Reeves is a 73 y.o. (DOB April 25, 1951) female.  Chief Complaint: No chief complaint on file.   HPI RAKEYA GLAB presents to the office today for follow-up of GAD.  Describes mood today as ok. Pleasant. Denies tearfulness. Mood symptoms - reports depression - 4 out of 10 and anxiety - 8 out of 10 - more anxious overall. Denies irritability. Reports decreased interest and motivation - I am keeping myself busy. Reports having to push herself more than she has. Denies recent panic attacks. Reports some worry, rumination and over thinking - situational. Reports mood is lower, but not bad. Stating I feel like I'm struggling. Taking medications as prescribed.  Energy levels lower. Active, but does not have a regular exercise routine, but stays active.   Enjoys some usual interests and activities. Married. Lives with husband. Extended family local. Spending time with family. Appetite adequate. Reports weight loss - 10 pounds. Sleeps well most nights. Averages 7 to 8 hours.  Focus and concentration stable. Completing tasks. Managing aspects of household. Retired - photographer. Denies SI or HI.  Denies AH or VH. Denies self harm. Denies substance use.  Previous medication trials: Denies   Flowsheet Row Admission (Discharged) from 09/02/2023 in [facility] CARDIAC CATH LAB ED from 06/25/2023 in [facility] Emergency Department at [facility]  C-SSRS RISK CATEGORY No Risk No Risk     Review of Systems:  Review of Systems  Musculoskeletal:  Negative for gait problem.  Neurological:  Negative for tremors.  Psychiatric/Behavioral:         Please refer to HPI    Medications: I have reviewed the patient's current medications.  Current Outpatient Medications  Medication Sig Dispense Refill   busPIRone (BUSPAR) 5 MG tablet Take 1 tablet (5 mg total) by mouth 3 (three) times  daily. 90 tablet 2   acetaminophen  (TYLENOL ) 500 MG tablet Take 1 tablet (500 mg total) by mouth every 8 (eight) hours as needed.     ALPRAZolam  (XANAX ) 0.25 MG tablet Take one tablet twice daily and 1/2 tablet daily as needed for increased anxiety. 65 tablet 2   Ascorbic Acid (VITAMIN C) 500 MG CHEW Chew 500 mg by mouth daily.     buPROPion (WELLBUTRIN XL) 150 MG 24 hr tablet Take 1 tablet (150 mg total) by mouth every morning. 90 tablet 1   cholecalciferol (VITAMIN D) 1000 units tablet Take 1,000 Units by mouth daily.     clobetasol  ointment (TEMOVATE ) 0.05 % Apply 1 Application topically 2 (two) times a week. Thin application on affected vulva. 30 g 4   diltiazem  (CARDIZEM ) 30 MG tablet Take 1 tablet (30 mg total) by mouth 4 (four) times daily as needed. 30 tablet 1   ELIQUIS  5 MG TABS tablet TAKE 1 TABLET BY MOUTH TWICE  DAILY 200 tablet 2   FLUoxetine (PROZAC) 40 MG capsule Take 1 capsule (40 mg total) by mouth every morning. 90 capsule 1   metoprolol  succinate (TOPROL  XL) 25 MG 24 hr tablet Take 0.5 tablets (12.5 mg total) by mouth daily. 45 tablet 2   nitroGLYCERIN  (NITROSTAT ) 0.4 MG SL tablet Place 0.4 mg under the tongue every 5 (five) minutes as needed for chest pain.     simvastatin  (ZOCOR ) 40 MG tablet Take 1 tablet (40 mg total) by mouth every morning. 90 tablet 1   No current facility-administered medications for this visit.  Medication Side Effects: None  Allergies:  Allergies  Allergen Reactions   Codeine Nausea And Vomiting   Lipitor [Atorvastatin ]     Panic Attack    Azithromycin Rash    Past Medical History:  Diagnosis Date   Anxiety    Arthritis    knees -generalized   Asthma    Atrial fibrillation (HCC)    Bronchitis    Depression    Headache    past history - none at present   Heart murmur    Osteopenia    Perimenopausal vasomotor symptoms    Vertigo    tx. 6 months ago- no problems now,very mild occ.    Past Medical History, Surgical history,  Social history, and Family history were reviewed and updated as appropriate.   Please see review of systems for further details on the patient's review from today.   Objective:   Physical Exam:  LMP 05/25/2008   Physical Exam Constitutional:      General: She is not in acute distress. Musculoskeletal:        General: No deformity.  Neurological:     Mental Status: She is alert and oriented to person, place, and time.     Coordination: Coordination normal.  Psychiatric:        Attention and Perception: Attention and perception normal. She does not perceive auditory or visual hallucinations.        Mood and Affect: Mood normal. Mood is not anxious or depressed. Affect is not labile, blunt, angry or inappropriate.        Speech: Speech normal.        Behavior: Behavior normal.        Thought Content: Thought content normal. Thought content is not paranoid or delusional. Thought content does not include homicidal or suicidal ideation. Thought content does not include homicidal or suicidal plan.        Cognition and Memory: Cognition and memory normal.        Judgment: Judgment normal.     Comments: Insight intact     Lab Review:     Component Value Date/Time   NA 140 05/01/2019 1331   K 4.1 05/01/2019 1331   CL 106 05/01/2019 1331   CO2 24 10/26/2017 1214   GLUCOSE 114 (H) 05/01/2019 1331   BUN 12 05/01/2019 1331   CREATININE 0.70 05/01/2019 1331   CALCIUM  9.5 10/26/2017 1214   GFRNONAA >60 10/26/2017 1214   GFRAA >60 10/26/2017 1214       Component Value Date/Time   WBC 9.1 10/26/2017 1214   RBC 5.15 (H) 10/26/2017 1214   HGB 14.3 05/01/2019 1331   HCT 42.0 05/01/2019 1331   PLT 370 10/26/2017 1214   MCV 91.8 10/26/2017 1214   MCH 29.7 10/26/2017 1214   MCHC 32.3 10/26/2017 1214   RDW 12.8 10/26/2017 1214   LYMPHSABS 3.5 09/09/2017 1706   MONOABS 0.8 09/09/2017 1706   EOSABS 0.4 09/09/2017 1706   BASOSABS 0.1 09/09/2017 1706    No results found for:  POCLITH, LITHIUM   No results found for: PHENYTOIN, PHENOBARB, VALPROATE, CBMZ   .res Assessment: Plan:    Treatment Plan/Recommendations:   Prozac  40mg  daily Wellbutrin  XL 150mg  daily  Add Buspar  5mg  TID  Increase Xanax  0.25mg  BID and a 0.125 as needed up to 10 days a month.   RTC 4 weeks  25 minutes spent dedicated to the care of this patient on the date of this encounter to include pre-visit review of records,  ordering of medication, post visit documentation, and face-to-face time with the patient discussing GAD. Discussed continuing current medication regimen.  Discussed potential benefits, risk, and side effects of benzodiazepines to include potential risk of tolerance and dependence, as well as possible drowsiness.  Advised patient not to drive if experiencing drowsiness and to take lowest possible effective dose to minimize risk of dependence and tolerance.   Diagnoses and all orders for this visit:  Generalized anxiety disorder -     buPROPion (WELLBUTRIN XL) 150 MG 24 hr tablet; Take 1 tablet (150 mg total) by mouth every morning. -     FLUoxetine (PROZAC) 40 MG capsule; Take 1 capsule (40 mg total) by mouth every morning. -     busPIRone (BUSPAR) 5 MG tablet; Take 1 tablet (5 mg total) by mouth 3 (three) times daily. -     ALPRAZolam  (XANAX ) 0.25 MG tablet; Take one tablet twice daily and 1/2 tablet daily as needed for increased anxiety.     Please see After Visit Summary for patient specific instructions.  Future Appointments  Date Time Provider Department Center  04/18/2024  1:00 PM Sherlynn Sober, LCSW [facility] None    No orders of the defined types were placed in this encounter.   -------------------------------

## 2024-04-18 ENCOUNTER — Ambulatory Visit: Admitting: Psychiatry

## 2024-04-24 ENCOUNTER — Ambulatory Visit: Admitting: Psychiatry

## 2024-04-24 DIAGNOSIS — F411 Generalized anxiety disorder: Secondary | ICD-10-CM

## 2024-04-24 NOTE — Progress Notes (Signed)
 [facility] Counselor/Therapist Progress Note  Patient ID: Leah Reeves, MRN: [id number],    Date: 04/24/2024  Time Spent: 53 minutes   Treatment Type: Individual Therapy  Reported Symptoms: anxiety, depression is better, jitteriness is improving   Mental Status Exam:  Appearance:   Casual and Neat     Behavior:  Appropriate, Sharing, and Motivated  Motor:  Normal  Speech/Language:   Clear and Coherent  Affect:  Depressed and anxious  Mood:  anxious and depressed  Thought process:  normal  Thought content:    WNL  Sensory/Perceptual disturbances:    WNL  Orientation:  oriented to person, place, time/date, situation, day of week, month of year, year, and stated date of Dec. 1, 2025  Attention:  Good  Concentration:  Good  Memory:  WNL  Fund of knowledge:   Good  Insight:    Good  Judgment:   Good  Impulse Control:  Good   Risk Assessment: Danger to Self:  No Self-injurious Behavior: No Danger to Others: No Duty to Warn:no Physical Aggression / Violence:No  Access to Firearms a concern: No  Gang Involvement:No   Subjective:  Patient today not as anxious nor depressed as last appt. I've definitely improved and believe the Buspar  med (per Regina Mozingo, DNP) is helping.  Less worried, less depressed, fewer fears, less nervous, smiling more. Adult kids noticing her being better. The shakiness is not gone but is better, and it does stop at times. Started Buspar  about 2 wks ago and feels it is helping. Daughter-in-law doing better physically so patient not as worried about her. Talking further through her anxiety and contributing factors within family as I was really anxious at first about Thanksgiving and we had to change plans and have family event at a relative's home but it worked out fine and family was supportive. Looking today and talking through some possible contributory issues including anxiety of getting out and going with a group of people, not wanting  to be in crowds nor with strangers, which were situations in which she was in later and did have some difficulty but is definitely feeling some better and more optimistic. Almost feeling slight improvement each day.  Feeling happier even though I'm not a 100%.  Husband supportive and checks in with her frequently. Relaxes more when he sees patient doing better. Notices that she is feeling more positive and hopeful as I get through this. Working to have fewer assumptions (negative) and that is helpful. Continues working to interrupt and decrease her worrying and overthinking, and acknowledging progress made. Overall, more confident and trying to be accepting even when negative things happen, trying not to let it negatively impact her thoughts/mood.   Interventions: Cognitive Behavioral Therapy, Solution-Oriented/Positive Psychology, and Ego-Supportive 1.Reduce overall level, frequency, and intensity of the anxiety so that daily functioning is not impaired. 2.Verbalize an understanding of the role that fearful thinking plays in creating fears, excessive worry, and persistent anxiety symptoms. 3.Develop behavioral and cognitive stategies to reduce or eliminate the irrational anxiety.    Diagnosis:   ICD-10-CM   1. Generalized anxiety disorder  F41.1      Plan:  Patient working well in session today on her anxiety and depression. Self-doubt, fears, and negative assumptions have decreased. Some further work today on perceived weaknesses although some better. Some improvement in my control issues and letting go, and working to decrease my people-pleasing. [facility] is making progress with her anxiety, depression, fears, self-doubt. She  continues her work with goal-directed behaviors as she notices the benefits and her progress in moving forward in a more hopeful directions. Return in 2-3 weeks.  Goal review and progress/challenges noted with patient.  Next appointment within 2 to 3  weeks.   Barnie Bunde, LCSW

## 2024-05-04 ENCOUNTER — Telehealth: Payer: Self-pay | Admitting: Adult Health

## 2024-05-04 NOTE — Telephone Encounter (Signed)
 LVM to [facility]

## 2024-05-04 NOTE — Telephone Encounter (Signed)
 Pt Lvm @ 9:44a requesting a call back about difficulties she is having with her medication.  She said she knows she has an appt with [facility] on Monday but she's wondering if there's anything [facility]man can tell her to help her over the weekend.  Next appt 12/15

## 2024-05-05 NOTE — Telephone Encounter (Signed)
 Last visit 11/19 and has appt 12/15.  Prozac  40mg  daily Wellbutrin  XL 150mg  daily   Add Buspar  5mg  TID   Increase Xanax  0.25mg  BID and a 0.125 as needed up to 10 days a month.     She is c/o of internal shakiness, but hand shakes when she tries to write, having PA. She reports Xanax  is beneficial and asks if she can have 0.25 TID. Told her the Buspar  was a low dose and that it may be able to be increased. Last filled alprazolam  11/19.

## 2024-05-05 NOTE — Telephone Encounter (Signed)
Patient notified of recommendation.

## 2024-05-07 ENCOUNTER — Other Ambulatory Visit: Payer: Self-pay | Admitting: Adult Health

## 2024-05-07 DIAGNOSIS — F411 Generalized anxiety disorder: Secondary | ICD-10-CM

## 2024-05-08 ENCOUNTER — Encounter: Payer: Self-pay | Admitting: Adult Health

## 2024-05-08 ENCOUNTER — Ambulatory Visit: Admitting: Adult Health

## 2024-05-08 DIAGNOSIS — F331 Major depressive disorder, recurrent, moderate: Secondary | ICD-10-CM

## 2024-05-08 DIAGNOSIS — F411 Generalized anxiety disorder: Secondary | ICD-10-CM | POA: Diagnosis not present

## 2024-05-08 MED ORDER — BUSPIRONE HCL 10 MG PO TABS: 10.0000 mg | ORAL_TABLET | Freq: Three times a day (TID) | ORAL | 2 refills | Status: DC

## 2024-05-08 NOTE — Progress Notes (Signed)
 [name] [id number] 08-01-1950 73 y.o.  Subjective:   Patient ID:  Leah Reeves is a 73 y.o. (DOB [birth year]) female.  Chief Complaint: No chief complaint on file.   HPI Leah Reeves presents to the office today for follow-up of GAD.  Describes mood today as ok. Pleasant. Denies tearfulness. Mood symptoms - reports depression - 4 to 5 out of 10, anxiety - 4 out of 10 - less anxious now. Denies irritability. Reports lower interest and motivation - I make myself get up and do things. Reports still having to push herself. Reports recent panic attack. Reports some worry, rumination and over thinking - trying to keep thoughts moving. Reports mood is lower. Stating I feel like I'm not passed the anxiety. Taking medications as prescribed.  Energy levels lower. Active, but does not have a regular exercise routine, but stays active.   Enjoys some usual interests and activities. Married. Lives with husband. Extended family local. Spending time with family. Appetite adequate. Reports weight loss - 132 pounds. Sleeps well most nights. Averages 7 to 8 hours.  Focus and concentration stable. Completing tasks. Managing aspects of household. Retired - photographer. Denies SI or HI.  Denies AH or VH. Denies self harm. Denies substance use.  Previous medication trials: Denies   Flowsheet Row Admission (Discharged) from 09/02/2023 in [facility] CARDIAC CATH LAB ED from 06/25/2023 in [facility] Emergency Department at [facility]  C-SSRS RISK CATEGORY No Risk No Risk     Review of Systems:  Review of Systems  Musculoskeletal:  Negative for gait problem.  Neurological:  Negative for tremors.  Psychiatric/Behavioral:         Please refer to HPI    Medications: I have reviewed the patient's current medications.  Current Outpatient Medications  Medication Sig Dispense Refill   acetaminophen  (TYLENOL ) 500 MG tablet Take 1 tablet (500 mg total) by mouth  every 8 (eight) hours as needed.     ALPRAZolam  (XANAX ) 0.25 MG tablet Take one tablet twice daily and 1/2 tablet daily as needed for increased anxiety. 65 tablet 2   Ascorbic Acid (VITAMIN C) 500 MG CHEW Chew 500 mg by mouth daily.     buPROPion  (WELLBUTRIN  XL) 150 MG 24 hr tablet Take 1 tablet (150 mg total) by mouth every morning. 90 tablet 1   busPIRone  (BUSPAR ) 10 MG tablet Take 1 tablet (10 mg total) by mouth 3 (three) times daily. 90 tablet 2   cholecalciferol (VITAMIN D) 1000 units tablet Take 1,000 Units by mouth daily.     clobetasol  ointment (TEMOVATE ) 0.05 % Apply 1 Application topically 2 (two) times a week. Thin application on affected vulva. 30 g 4   diltiazem  (CARDIZEM ) 30 MG tablet Take 1 tablet (30 mg total) by mouth 4 (four) times daily as needed. 30 tablet 1   ELIQUIS  5 MG TABS tablet TAKE 1 TABLET BY MOUTH TWICE  DAILY 200 tablet 2   FLUoxetine  (PROZAC ) 40 MG capsule Take 1 capsule (40 mg total) by mouth every morning. 90 capsule 1   metoprolol  succinate (TOPROL  XL) 25 MG 24 hr tablet Take 0.5 tablets (12.5 mg total) by mouth daily. 45 tablet 2   nitroGLYCERIN  (NITROSTAT ) 0.4 MG SL tablet Place 0.4 mg under the tongue every 5 (five) minutes as needed for chest pain.     simvastatin  (ZOCOR ) 40 MG tablet Take 1 tablet (40 mg total) by mouth every morning. 90 tablet 1   No current facility-administered medications for  this visit.    Medication Side Effects: None  Allergies: Allergies[1]  Past Medical History:  Diagnosis Date   Anxiety    Arthritis    knees -generalized   Asthma    Atrial fibrillation (HCC)    Bronchitis    Depression    Headache    past history - none at present   Heart murmur    Osteopenia    Perimenopausal vasomotor symptoms    Vertigo    tx. 6 months ago- no problems now,very mild occ.    Past Medical History, Surgical history, Social history, and Family history were reviewed and updated as appropriate.   Please see review of systems for  further details on the patient's review from today.   Objective:   Physical Exam:  LMP 05/25/2008   Physical Exam Constitutional:      General: She is not in acute distress. Musculoskeletal:        General: No deformity.  Neurological:     Mental Status: She is alert and oriented to person, place, and time.     Coordination: Coordination normal.  Psychiatric:        Attention and Perception: Attention and perception normal. She does not perceive auditory or visual hallucinations.        Mood and Affect: Mood normal. Mood is not anxious or depressed. Affect is not labile, blunt, angry or inappropriate.        Speech: Speech normal.        Behavior: Behavior normal.        Thought Content: Thought content normal. Thought content is not paranoid or delusional. Thought content does not include homicidal or suicidal ideation. Thought content does not include homicidal or suicidal plan.        Cognition and Memory: Cognition and memory normal.        Judgment: Judgment normal.     Comments: Insight intact     Lab Review:     Component Value Date/Time   NA 140 05/01/2019 1331   K 4.1 05/01/2019 1331   CL 106 05/01/2019 1331   CO2 24 10/26/2017 1214   GLUCOSE 114 (H) 05/01/2019 1331   BUN 12 05/01/2019 1331   CREATININE 0.70 05/01/2019 1331   CALCIUM  9.5 10/26/2017 1214   GFRNONAA >60 10/26/2017 1214   GFRAA >60 10/26/2017 1214       Component Value Date/Time   WBC 9.1 10/26/2017 1214   RBC 5.15 (H) 10/26/2017 1214   HGB 14.3 05/01/2019 1331   HCT 42.0 05/01/2019 1331   PLT 370 10/26/2017 1214   MCV 91.8 10/26/2017 1214   MCH 29.7 10/26/2017 1214   MCHC 32.3 10/26/2017 1214   RDW 12.8 10/26/2017 1214   LYMPHSABS 3.5 09/09/2017 1706   MONOABS 0.8 09/09/2017 1706   EOSABS 0.4 09/09/2017 1706   BASOSABS 0.1 09/09/2017 1706    No results found for: POCLITH, LITHIUM   No results found for: PHENYTOIN, PHENOBARB, VALPROATE, CBMZ   .res Assessment: Plan:     Treatment Plan/Recommendations:   Increase Buspar  5mg  to 10 TID  Prozac  40mg  daily Wellbutrin  XL 150mg  daily  Xanax  0.25mg  BID and a 0.125 as needed up to 10 days a month.   RTC 4 weeks  25 minutes spent dedicated to the care of this patient on the date of this encounter to include pre-visit review of records, ordering of medication, post visit documentation, and face-to-face time with the patient discussing GAD. Discussed continuing current medication regimen.  Discussed  potential benefits, risk, and side effects of benzodiazepines to include potential risk of tolerance and dependence, as well as possible drowsiness.  Advised patient not to drive if experiencing drowsiness and to take lowest possible effective dose to minimize risk of dependence and tolerance.   Diagnoses and all orders for this visit:  Generalized anxiety disorder -     busPIRone  (BUSPAR ) 10 MG tablet; Take 1 tablet (10 mg total) by mouth 3 (three) times daily.  Major depressive disorder, recurrent episode, moderate (HCC)     Please see After Visit Summary for patient specific instructions.  Future Appointments  Date Time Provider Department Center  05/30/2024 11:00 AM Sherlynn Sober, LCSW [facility] None    No orders of the defined types were placed in this encounter.   -------------------------------     [1]  Allergies Allergen Reactions   Codeine Nausea And Vomiting   Lipitor [Atorvastatin ]     Panic Attack    Azithromycin Rash

## 2024-05-30 ENCOUNTER — Ambulatory Visit: Admitting: Psychiatry

## 2024-05-30 DIAGNOSIS — F411 Generalized anxiety disorder: Secondary | ICD-10-CM

## 2024-05-30 NOTE — Progress Notes (Signed)
 "       [facility] Counselor/Therapist Progress Note  Patient ID: Leah Reeves, MRN: [id number],    Date: 05/30/2024  Time Spent: 53 minutes   Treatment Type: Individual Therapy  Reported Symptoms:    Anxiety, depression, jitteriness   Mental Status Exam:  Appearance:   Casual and Neat     Behavior:  Appropriate, Sharing, and Motivated  Motor:  Normal  Speech/Language:   Clear and Coherent  Affect:  Anxious, depressed  Mood:  anxious and depressed  Thought process:  goal directed  Thought content:    WNL and Rumination  Sensory/Perceptual disturbances:    WNL  Orientation:  oriented to person, place, time/date, situation, day of week, month of year, year, and stated date of Jan. 6, 2026  Attention:  Fair  Concentration:  Good and    Memory:  WNL  Fund of knowledge:   Good  Insight:    Good  Judgment:   Good  Impulse Control:  Good   Risk Assessment: Danger to Self:  No Self-injurious Behavior: No Danger to Others: No Duty to Warn:no Physical Aggression / Violence:No  Access to Firearms a concern: No  Gang Involvement:No   Subjective:   Patient in session today continuing to work on anxiety and some depression and feeling jittery. I don't often know why I'm jittery but it doesn't usually last long. Did ok through holidays but feeling a little blah now and don't understand why, but I have started Buspar  so that may be having an effect on me. It's strange how I go for a few day and do really well, and then start the anxiousness and jitteriness for no reason. Today reports feeling blah but talking freely in session, including some smiles and occasional laughs. Shares feeling uncomfortable having others (that she doesn't know) walking by their house recently and has issues with trust and germs especially if I don't know a person.Difficulty getting up but when I get up and move I'm good. Panic attack a couple wks ago. Better since then. Does feel Buspar  is  helping. Not quite as good as at last appt, as I thought I had made monumental progress but then next week started and I was blah.  Not as enthusiastic as I normally am, and her affect is a mixture of smiling and flat.  Encouraged her involvement with people and in activities that she enjoys and she is motivated to get and feel better.  Also encouraged her to continue working with the strategies we have previously discussed and used before including not making quick assumptions on important issues and giving herself some time, being able to talk them out more thoroughly in sessions or with trusted family members as appropriate, remain in the present and focused on what she can control or change, believe more in herself, use of healthy boundaries, interrupt anxious/depressive thoughts and challenge them to replace with more realistic thoughts, contact with supportive people, remain connected within church family which is very supportive of her and her husband, allow her faith to be a healing resource for her emotionally as well as spiritually, positive self-talk, and recognize the strengths she shows when working with goal-directed behaviors helping her move in a direction that best supports her overall improved emotional health and her vision into the future.  Leah Reeves does continue to show progress even in the midst of obstacles, she has improved her focus and motivation and management of her anxiety but is currently having a more difficult  time with her anxiety feeling stronger but also a mixed bag of anxiety and feeling blah.  She continues to work with goal-directed behaviors aimed at helping her move in a healthier and more hopeful direction into the future.   Interventions: Cognitive Behavioral Therapy, Solution-Oriented/Positive Psychology, and Ego-Supportive 1.Reduce overall level, frequency, and intensity of the anxiety so that daily functioning is not impaired. 2.Verbalize an understanding of  the role that fearful thinking plays in creating fears, excessive worry, and persistent anxiety symptoms. 3.Develop behavioral and cognitive stategies to reduce or eliminate the irrational anxiety.    Diagnosis:   ICD-10-CM   1. Generalized anxiety disorder  F41.1      Plan:    Patient showing good effort in session working further on her depression and anxiety. Sleeping well.  She does see her med provider soon about possibly tweaking my medication, Buspar .  In the meantime she is taking it as prescribed and hoping that it will help more eventually and is very understanding of how it can improve in time or may be adjusted by her med provider and ways that are helpful..  Worked well in session today on specific target areas of her anxiety and depression, fears and doubt, negative assumptions which have decreased, perceived weaknesses, people pleasing, and some control issues.  Leah Reeves has made some progress but is currently having a little more struggle with some anxiety and depression/feeling blah however according to her report these moods do not always stay there is sometimes some interruption but she is not aware of what is making the difference.  Ms. Dull needs to work with her goal-directed behaviors and has noticed some past benefits and progress in her moving forward in a more hopeful direction, and to pick back up on that projection going forward.  Next appointment will be with her med provider [facility].  Therapist will see her again within a couple of weeks after that appointment.  Goal review and progress/challenges noted with patient.  Next appointment within 2 to 3 weeks.   Barnie Bunde, LCSW                   "

## 2024-05-31 ENCOUNTER — Other Ambulatory Visit: Payer: Self-pay | Admitting: Adult Health

## 2024-05-31 DIAGNOSIS — F411 Generalized anxiety disorder: Secondary | ICD-10-CM

## 2024-06-05 ENCOUNTER — Other Ambulatory Visit: Payer: Self-pay | Admitting: Adult Health

## 2024-06-05 ENCOUNTER — Telehealth: Payer: Self-pay | Admitting: Adult Health

## 2024-06-05 DIAGNOSIS — F411 Generalized anxiety disorder: Secondary | ICD-10-CM

## 2024-06-05 MED ORDER — ALPRAZOLAM 0.25 MG PO TABS: ORAL_TABLET | ORAL | 0 refills | Status: DC

## 2024-06-05 NOTE — Telephone Encounter (Signed)
 Pt reports taking more alprazolam  than prescribed, sometimes taking an extra tablet instead of 1/2. LF 12/8 and asking for 8 tablets to get her to her appt on 1/16.

## 2024-06-05 NOTE — Telephone Encounter (Signed)
 Pt last filled alprazolam  0.25 mg 12/18 and should have enough to get to her appt on 1/16.

## 2024-06-05 NOTE — Telephone Encounter (Signed)
 Patient Leah Reeves at 11:11 stating that she has an appt 1/16 but she will run out of Alprazolam  0.25mg . She asked if a prescription for about 8 pills could be sent in to get her to her appt. PH: [phone] Pharmacy [facility] [id number] [place]

## 2024-06-09 ENCOUNTER — Encounter: Payer: Self-pay | Admitting: Adult Health

## 2024-06-09 ENCOUNTER — Ambulatory Visit: Admitting: Adult Health

## 2024-06-09 DIAGNOSIS — F411 Generalized anxiety disorder: Secondary | ICD-10-CM

## 2024-06-09 DIAGNOSIS — F331 Major depressive disorder, recurrent, moderate: Secondary | ICD-10-CM

## 2024-06-09 MED ORDER — ALPRAZOLAM 0.25 MG PO TABS: ORAL_TABLET | ORAL | 0 refills | Status: AC

## 2024-06-09 NOTE — Progress Notes (Signed)
 Leah Reeves [id number] Oct 20, 1950 74 y.o.  Virtual Visit via Telephone Note  I connected with pt on 06/09/24 at 11:00 AM EST by telephone and verified that I am speaking with the correct person using two identifiers.   I discussed the limitations, risks, security and privacy concerns of performing an evaluation and management service by telephone and the availability of in person appointments. I also discussed with the patient that there may be a patient responsible charge related to this service. The patient expressed understanding and agreed to proceed.   I discussed the assessment and treatment plan with the patient. The patient was provided an opportunity to ask questions and all were answered. The patient agreed with the plan and demonstrated an understanding of the instructions.   The patient was advised to call back or seek an in-person evaluation if the symptoms worsen or if the condition fails to improve as anticipated.  I provided 20 minutes of non-face-to-face time during this encounter.  The patient was located at home.  The provider was located at [facility].   Angeline LOISE Sayers, NP   Subjective:   Patient ID:  Leah Reeves is a 74 y.o. (DOB 1950-12-16) female.  Chief Complaint: No chief complaint on file.   HPI Leah Reeves presents for follow-up of GAD.  Describes mood today as ok. Pleasant. Denies tearfulness. Mood symptoms - reports depression - slightly, not major. Reports anxiety has improved - nothing like what I used to have. Denies irritability. Reports lower interest and motivation. Reports she is still pushing herself more than she would like. Reports recent panic attack. Reports some worry, rumination and over thinking - that has gotten better. Reports mood is somewhat better. Stating I'm still not where I want to be.  Taking medications as prescribed.  Energy levels lower. Active, but does not have a regular exercise  routine, but stays active.   Enjoys some usual interests and activities. Married. Lives with husband. Extended family local. Spending time with family. Appetite adequate. Reports weight loss - 130 from 132 pounds - weight loss 15 pounds over the past year. Sleeps well most nights. Averages 7 to 8 hours.  Focus and concentration stable. Completing tasks. Managing aspects of household. Retired - photographer. Denies SI or HI.  Denies AH or VH. Denies self harm. Denies substance use.  Previous medication trials: Denies   Review of Systems:  Review of Systems  Musculoskeletal:  Negative for gait problem.  Neurological:  Negative for tremors.  Psychiatric/Behavioral:         Please refer to HPI    Medications: I have reviewed the patient's current medications.  Current Outpatient Medications  Medication Sig Dispense Refill   acetaminophen  (TYLENOL ) 500 MG tablet Take 1 tablet (500 mg total) by mouth every 8 (eight) hours as needed.     ALPRAZolam  (XANAX ) 0.25 MG tablet Take one tablet twice daily and 1/2 tablet daily as needed for increased anxiety. 8 tablet 0   Ascorbic Acid (VITAMIN C) 500 MG CHEW Chew 500 mg by mouth daily.     buPROPion  (WELLBUTRIN  XL) 150 MG 24 hr tablet Take 1 tablet (150 mg total) by mouth every morning. 90 tablet 1   busPIRone  (BUSPAR ) 10 MG tablet TAKE 1 TABLET BY MOUTH THREE TIMES A DAY 270 tablet 0   cholecalciferol (VITAMIN D) 1000 units tablet Take 1,000 Units by mouth daily.     clobetasol  ointment (TEMOVATE ) 0.05 % Apply 1 Application topically 2 (two) times a  week. Thin application on affected vulva. 30 g 4   diltiazem  (CARDIZEM ) 30 MG tablet Take 1 tablet (30 mg total) by mouth 4 (four) times daily as needed. 30 tablet 1   ELIQUIS  5 MG TABS tablet TAKE 1 TABLET BY MOUTH TWICE  DAILY 200 tablet 2   FLUoxetine  (PROZAC ) 40 MG capsule Take 1 capsule (40 mg total) by mouth every morning. 90 capsule 1   metoprolol  succinate (TOPROL  XL) 25 MG 24 hr tablet Take 0.5  tablets (12.5 mg total) by mouth daily. 45 tablet 2   nitroGLYCERIN  (NITROSTAT ) 0.4 MG SL tablet Place 0.4 mg under the tongue every 5 (five) minutes as needed for chest pain.     simvastatin  (ZOCOR ) 40 MG tablet Take 1 tablet (40 mg total) by mouth every morning. 90 tablet 1   No current facility-administered medications for this visit.    Medication Side Effects: None  Allergies: Allergies[1]  Past Medical History:  Diagnosis Date   Anxiety    Arthritis    knees -generalized   Asthma    Atrial fibrillation (HCC)    Bronchitis    Depression    Headache    past history - none at present   Heart murmur    Osteopenia    Perimenopausal vasomotor symptoms    Vertigo    tx. 6 months ago- no problems now,very mild occ.    Family History  Problem Relation Age of Onset   Diabetes Mother    Hypertension Mother    Heart disease Father    Diabetes Sister    Heart disease Sister    Breast cancer Neg Hx     Social History   Socioeconomic History   Marital status: Married    Spouse name: Not on file   Number of children: Not on file   Years of education: Not on file   Highest education level: Not on file  Occupational History   Not on file  Tobacco Use   Smoking status: Former    Current packs/day: 0.00    Average packs/day: 0.5 packs/day for 3.0 years (1.5 ttl pk-yrs)    Types: Cigarettes    Start date: 05/25/1988    Quit date: 05/26/1991    Years since quitting: 33.0    Passive exposure: Past   Smokeless tobacco: Never   Tobacco comments:    Former smoker 04/29/22  Vaping Use   Vaping status: Never Used  Substance and Sexual Activity   Alcohol use: Not Currently   Drug use: No   Sexual activity: Yes    Partners: Male    Birth control/protection: Post-menopausal    Comment: 1st intercourse- 23, partners- 3, married- 27 yrs   Other Topics Concern   Not on file  Social History Narrative   Not on file   Social Drivers of Health   Tobacco Use: Medium Risk  (05/08/2024)   Patient History    Smoking Tobacco Use: Former    Smokeless Tobacco Use: Never    Passive Exposure: Past  Programmer, Applications: Not on [employer] Insecurity: Not on file  Transportation Needs: Not on file  Physical Activity: Not on file  Stress: Not on file  Social Connections: Not on file  Intimate Partner Violence: Not on file  Depression (PHQ2-9): Not on file  Alcohol Screen: Not on file  Housing: Not on file  Utilities: Not on file  Health Literacy: Not on file    Past Medical History, Surgical history, Social history,  and Family history were reviewed and updated as appropriate.   Please see review of systems for further details on the patient's review from today.   Objective:   Physical Exam:  LMP 05/25/2008   Physical Exam Constitutional:      General: She is not in acute distress. Musculoskeletal:        General: No deformity.  Neurological:     Mental Status: She is alert and oriented to person, place, and time.     Coordination: Coordination normal.  Psychiatric:        Attention and Perception: Attention and perception normal. She does not perceive auditory or visual hallucinations.        Mood and Affect: Mood normal. Mood is not anxious or depressed. Affect is not labile, blunt, angry or inappropriate.        Speech: Speech normal.        Behavior: Behavior normal.        Thought Content: Thought content normal. Thought content is not paranoid or delusional. Thought content does not include homicidal or suicidal ideation. Thought content does not include homicidal or suicidal plan.        Cognition and Memory: Cognition and memory normal.        Judgment: Judgment normal.     Comments: Insight intact     Lab Review:     Component Value Date/Time   NA 140 05/01/2019 1331   K 4.1 05/01/2019 1331   CL 106 05/01/2019 1331   CO2 24 10/26/2017 1214   GLUCOSE 114 (H) 05/01/2019 1331   BUN 12 05/01/2019 1331   CREATININE 0.70 05/01/2019  1331   CALCIUM  9.5 10/26/2017 1214   GFRNONAA >60 10/26/2017 1214   GFRAA >60 10/26/2017 1214       Component Value Date/Time   WBC 9.1 10/26/2017 1214   RBC 5.15 (H) 10/26/2017 1214   HGB 14.3 05/01/2019 1331   HCT 42.0 05/01/2019 1331   PLT 370 10/26/2017 1214   MCV 91.8 10/26/2017 1214   MCH 29.7 10/26/2017 1214   MCHC 32.3 10/26/2017 1214   RDW 12.8 10/26/2017 1214   LYMPHSABS 3.5 09/09/2017 1706   MONOABS 0.8 09/09/2017 1706   EOSABS 0.4 09/09/2017 1706   BASOSABS 0.1 09/09/2017 1706    No results found for: POCLITH, LITHIUM   No results found for: PHENYTOIN, PHENOBARB, VALPROATE, CBMZ   .res Assessment: Plan:    Treatment Plan/Recommendations:   Change: Decrease Buspar  10mg  TID to 5mg  TID - increased nauseous. Xanax  0.25mg  TID from BID - she is not taking 3 times a day routinely, but needs an extra tablet some days - will monitor over the next month.  Continue: Prozac  40mg  daily Wellbutrin  XL 150mg  daily  RTC 4 weeks  20 minutes spent dedicated to the care of this patient on the date of this encounter to include pre-visit review of records, ordering of medication, post visit documentation, and face-to-face time with the patient discussing GAD. Discussed continuing current medication regimen.  Discussed potential benefits, risk, and side effects of benzodiazepines to include potential risk of tolerance and dependence, as well as possible drowsiness. Advised patient not to drive if experiencing drowsiness and to take lowest possible effective dose to minimize risk of dependence and tolerance.   There are no diagnoses linked to this encounter.  Please see After Visit Summary for patient specific instructions.  Future Appointments  Date Time Provider Department Center  06/09/2024 11:00 AM Betta Balla Nattalie, NP [facility] None  06/20/2024 10:00 AM Sherlynn Sober, LCSW [facility] None    No orders of the defined types were placed in this encounter.      -------------------------------    [1]  Allergies Allergen Reactions   Codeine Nausea And Vomiting   Lipitor [Atorvastatin ]     Panic Attack    Azithromycin Rash

## 2024-06-20 ENCOUNTER — Ambulatory Visit: Admitting: Psychiatry

## 2024-06-21 ENCOUNTER — Telehealth: Payer: Self-pay | Admitting: Adult Health

## 2024-06-21 NOTE — Telephone Encounter (Signed)
 Pt notified ok to increase Buspar  back to 10 mg TID.

## 2024-06-21 NOTE — Telephone Encounter (Signed)
 Pt was seen 1/16 and Decrease Buspar  10mg  TID to 5mg  TID - increased nauseous   She says her anxiety has increased and she wants to go back to 10 mg TID. She said she will eat something when she takes it. She wants to know if it is ok to increase dose.

## 2024-06-21 NOTE — Telephone Encounter (Signed)
 Pam lvm that she would like to talk to gina about her medication. She thinks it needs to be increased. Please give her call back at [phone]

## 2024-06-27 ENCOUNTER — Ambulatory Visit: Admitting: Psychiatry

## 2024-07-03 ENCOUNTER — Ambulatory Visit: Admitting: Psychiatry

## 2024-07-05 ENCOUNTER — Telehealth: Admitting: Adult Health
